# Patient Record
Sex: Female | Born: 1980 | Race: Black or African American | Hispanic: No | Marital: Single | Smoking: Never smoker
Health system: Southern US, Community
[De-identification: ages and names within clinical notes are randomized; demographics above are authoritative.]

## PROBLEM LIST (undated history)

## (undated) DIAGNOSIS — I10 Essential (primary) hypertension: Secondary | ICD-10-CM

## (undated) DIAGNOSIS — G932 Benign intracranial hypertension: Secondary | ICD-10-CM

## (undated) DIAGNOSIS — E282 Polycystic ovarian syndrome: Secondary | ICD-10-CM

## (undated) DIAGNOSIS — S060X9A Concussion with loss of consciousness of unspecified duration, initial encounter: Secondary | ICD-10-CM

## (undated) DIAGNOSIS — E038 Other specified hypothyroidism: Secondary | ICD-10-CM

## (undated) DIAGNOSIS — S060XAA Concussion with loss of consciousness status unknown, initial encounter: Secondary | ICD-10-CM

## (undated) HISTORY — DX: Polycystic ovarian syndrome: E28.2

## (undated) HISTORY — DX: Other specified hypothyroidism: E03.8

## (undated) HISTORY — PX: TUBAL LIGATION: SHX77

## (undated) HISTORY — DX: Benign intracranial hypertension: G93.2

## (undated) HISTORY — DX: Concussion with loss of consciousness of unspecified duration, initial encounter: S06.0X9A

## (undated) HISTORY — DX: Concussion with loss of consciousness status unknown, initial encounter: S06.0XAA

---
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
## 2000-01-16 ENCOUNTER — Encounter: Admission: RE | Admit: 2000-01-16 | Discharge: 2000-01-16 | Payer: Self-pay | Admitting: Family Medicine

## 2000-01-22 ENCOUNTER — Encounter: Admission: RE | Admit: 2000-01-22 | Discharge: 2000-01-22 | Payer: Self-pay | Admitting: Family Medicine

## 2000-02-17 ENCOUNTER — Encounter: Admission: RE | Admit: 2000-02-17 | Discharge: 2000-02-17 | Payer: Self-pay | Admitting: Family Medicine

## 2000-04-20 ENCOUNTER — Encounter: Admission: RE | Admit: 2000-04-20 | Discharge: 2000-04-20 | Payer: Self-pay | Admitting: Family Medicine

## 2000-07-08 ENCOUNTER — Encounter: Admission: RE | Admit: 2000-07-08 | Discharge: 2000-07-08 | Payer: Self-pay | Admitting: Family Medicine

## 2000-08-18 ENCOUNTER — Encounter: Admission: RE | Admit: 2000-08-18 | Discharge: 2000-08-18 | Payer: Self-pay | Admitting: Family Medicine

## 2000-09-28 ENCOUNTER — Encounter: Admission: RE | Admit: 2000-09-28 | Discharge: 2000-09-28 | Payer: Self-pay | Admitting: Sports Medicine

## 2000-10-05 ENCOUNTER — Encounter: Admission: RE | Admit: 2000-10-05 | Discharge: 2000-10-05 | Payer: Self-pay | Admitting: Family Medicine

## 2000-10-11 ENCOUNTER — Other Ambulatory Visit: Admission: RE | Admit: 2000-10-11 | Discharge: 2000-10-11 | Payer: Self-pay | Admitting: Family Medicine

## 2000-10-12 ENCOUNTER — Encounter: Admission: RE | Admit: 2000-10-12 | Discharge: 2000-10-12 | Payer: Self-pay | Admitting: Family Medicine

## 2000-10-25 ENCOUNTER — Encounter: Admission: RE | Admit: 2000-10-25 | Discharge: 2000-10-25 | Payer: Self-pay | Admitting: Family Medicine

## 2001-01-10 ENCOUNTER — Encounter: Admission: RE | Admit: 2001-01-10 | Discharge: 2001-01-10 | Payer: Self-pay | Admitting: Family Medicine

## 2001-01-13 ENCOUNTER — Inpatient Hospital Stay (HOSPITAL_COMMUNITY): Admission: AD | Admit: 2001-01-13 | Discharge: 2001-01-13 | Payer: Self-pay | Admitting: Obstetrics & Gynecology

## 2001-01-31 ENCOUNTER — Encounter: Admission: RE | Admit: 2001-01-31 | Discharge: 2001-01-31 | Payer: Self-pay | Admitting: Family Medicine

## 2001-04-20 ENCOUNTER — Encounter: Admission: RE | Admit: 2001-04-20 | Discharge: 2001-04-20 | Payer: Self-pay | Admitting: Family Medicine

## 2001-04-20 ENCOUNTER — Encounter (INDEPENDENT_AMBULATORY_CARE_PROVIDER_SITE_OTHER): Payer: Self-pay | Admitting: *Deleted

## 2001-08-01 ENCOUNTER — Encounter: Admission: RE | Admit: 2001-08-01 | Discharge: 2001-08-01 | Payer: Self-pay | Admitting: Family Medicine

## 2001-10-06 ENCOUNTER — Encounter: Admission: RE | Admit: 2001-10-06 | Discharge: 2001-10-06 | Payer: Self-pay | Admitting: Family Medicine

## 2002-08-17 ENCOUNTER — Inpatient Hospital Stay (HOSPITAL_COMMUNITY): Admission: AD | Admit: 2002-08-17 | Discharge: 2002-08-17 | Payer: Self-pay | Admitting: Obstetrics & Gynecology

## 2002-08-31 ENCOUNTER — Encounter: Payer: Self-pay | Admitting: Obstetrics and Gynecology

## 2002-08-31 ENCOUNTER — Other Ambulatory Visit: Admission: RE | Admit: 2002-08-31 | Discharge: 2002-08-31 | Payer: Self-pay | Admitting: Obstetrics and Gynecology

## 2002-08-31 ENCOUNTER — Ambulatory Visit (HOSPITAL_COMMUNITY): Admission: RE | Admit: 2002-08-31 | Discharge: 2002-08-31 | Payer: Self-pay | Admitting: Obstetrics and Gynecology

## 2002-10-18 ENCOUNTER — Inpatient Hospital Stay (HOSPITAL_COMMUNITY): Admission: AD | Admit: 2002-10-18 | Discharge: 2002-10-18 | Payer: Self-pay | Admitting: Obstetrics & Gynecology

## 2002-10-18 ENCOUNTER — Encounter: Payer: Self-pay | Admitting: Obstetrics & Gynecology

## 2003-01-18 ENCOUNTER — Inpatient Hospital Stay (HOSPITAL_COMMUNITY): Admission: AD | Admit: 2003-01-18 | Discharge: 2003-01-27 | Payer: Self-pay | Admitting: Obstetrics and Gynecology

## 2003-01-19 ENCOUNTER — Encounter: Payer: Self-pay | Admitting: Obstetrics and Gynecology

## 2003-01-24 ENCOUNTER — Encounter (INDEPENDENT_AMBULATORY_CARE_PROVIDER_SITE_OTHER): Payer: Self-pay | Admitting: *Deleted

## 2004-02-02 ENCOUNTER — Emergency Department (HOSPITAL_COMMUNITY): Admission: EM | Admit: 2004-02-02 | Discharge: 2004-02-02 | Payer: Self-pay | Admitting: Emergency Medicine

## 2005-09-01 ENCOUNTER — Ambulatory Visit: Payer: Self-pay | Admitting: Family Medicine

## 2005-09-03 ENCOUNTER — Encounter (INDEPENDENT_AMBULATORY_CARE_PROVIDER_SITE_OTHER): Payer: Self-pay | Admitting: *Deleted

## 2005-09-03 LAB — CONVERTED CEMR LAB

## 2005-09-30 ENCOUNTER — Emergency Department (HOSPITAL_COMMUNITY): Admission: EM | Admit: 2005-09-30 | Discharge: 2005-09-30 | Payer: Self-pay | Admitting: Emergency Medicine

## 2006-05-27 DIAGNOSIS — N912 Amenorrhea, unspecified: Secondary | ICD-10-CM | POA: Insufficient documentation

## 2006-05-27 DIAGNOSIS — E282 Polycystic ovarian syndrome: Secondary | ICD-10-CM | POA: Insufficient documentation

## 2006-05-27 DIAGNOSIS — N946 Dysmenorrhea, unspecified: Secondary | ICD-10-CM | POA: Insufficient documentation

## 2006-05-27 DIAGNOSIS — I10 Essential (primary) hypertension: Secondary | ICD-10-CM

## 2006-05-27 DIAGNOSIS — L680 Hirsutism: Secondary | ICD-10-CM

## 2006-05-28 ENCOUNTER — Encounter (INDEPENDENT_AMBULATORY_CARE_PROVIDER_SITE_OTHER): Payer: Self-pay | Admitting: *Deleted

## 2006-08-02 ENCOUNTER — Telehealth (INDEPENDENT_AMBULATORY_CARE_PROVIDER_SITE_OTHER): Payer: Self-pay | Admitting: *Deleted

## 2006-08-05 ENCOUNTER — Ambulatory Visit: Payer: Self-pay | Admitting: Family Medicine

## 2006-08-05 ENCOUNTER — Encounter (INDEPENDENT_AMBULATORY_CARE_PROVIDER_SITE_OTHER): Payer: Self-pay | Admitting: Family Medicine

## 2006-09-30 ENCOUNTER — Other Ambulatory Visit: Admission: RE | Admit: 2006-09-30 | Discharge: 2006-09-30 | Payer: Self-pay | Admitting: Gynecology

## 2007-08-05 ENCOUNTER — Emergency Department (HOSPITAL_COMMUNITY): Admission: EM | Admit: 2007-08-05 | Discharge: 2007-08-05 | Payer: Self-pay | Admitting: Family Medicine

## 2007-08-25 ENCOUNTER — Inpatient Hospital Stay (HOSPITAL_COMMUNITY): Admission: AD | Admit: 2007-08-25 | Discharge: 2007-08-25 | Payer: Self-pay | Admitting: Gynecology

## 2007-10-19 ENCOUNTER — Ambulatory Visit (HOSPITAL_COMMUNITY): Admission: RE | Admit: 2007-10-19 | Discharge: 2007-10-19 | Payer: Self-pay | Admitting: Family Medicine

## 2007-10-19 ENCOUNTER — Ambulatory Visit: Payer: Self-pay | Admitting: Family Medicine

## 2007-10-21 ENCOUNTER — Encounter: Payer: Self-pay | Admitting: Family Medicine

## 2007-10-21 ENCOUNTER — Ambulatory Visit: Payer: Self-pay | Admitting: Family Medicine

## 2007-10-21 LAB — CONVERTED CEMR LAB
BUN: 14 mg/dL (ref 6–23)
CO2: 20 meq/L (ref 19–32)
Chloride: 106 meq/L (ref 96–112)
Glucose, Bld: 86 mg/dL (ref 70–99)
HCT: 41.1 % (ref 36.0–46.0)
Hemoglobin: 12.9 g/dL (ref 12.0–15.0)
LDL Cholesterol: 160 mg/dL — ABNORMAL HIGH (ref 0–99)
Potassium: 4 meq/L (ref 3.5–5.3)
Protein, U semiquant: 30
RBC: 4.45 M/uL (ref 3.87–5.11)
TSH: 2.978 microintl units/mL (ref 0.350–4.50)
VLDL: 27 mg/dL (ref 0–40)

## 2007-10-25 ENCOUNTER — Encounter: Payer: Self-pay | Admitting: *Deleted

## 2007-10-25 ENCOUNTER — Encounter: Payer: Self-pay | Admitting: Family Medicine

## 2007-11-18 ENCOUNTER — Encounter: Payer: Self-pay | Admitting: *Deleted

## 2008-04-08 ENCOUNTER — Inpatient Hospital Stay (HOSPITAL_COMMUNITY): Admission: AD | Admit: 2008-04-08 | Discharge: 2008-04-08 | Payer: Self-pay | Admitting: Obstetrics and Gynecology

## 2008-07-11 ENCOUNTER — Ambulatory Visit: Payer: Self-pay | Admitting: Critical Care Medicine

## 2008-07-11 ENCOUNTER — Inpatient Hospital Stay (HOSPITAL_COMMUNITY): Admission: AD | Admit: 2008-07-11 | Discharge: 2008-07-15 | Payer: Self-pay | Admitting: Obstetrics and Gynecology

## 2008-07-11 ENCOUNTER — Encounter (INDEPENDENT_AMBULATORY_CARE_PROVIDER_SITE_OTHER): Payer: Self-pay | Admitting: Obstetrics and Gynecology

## 2009-12-29 ENCOUNTER — Inpatient Hospital Stay (HOSPITAL_COMMUNITY): Admission: AD | Admit: 2009-12-29 | Discharge: 2009-12-29 | Payer: Self-pay | Admitting: Obstetrics and Gynecology

## 2010-03-13 ENCOUNTER — Ambulatory Visit (HOSPITAL_COMMUNITY)
Admission: RE | Admit: 2010-03-13 | Discharge: 2010-03-13 | Payer: Self-pay | Source: Home / Self Care | Attending: Obstetrics and Gynecology | Admitting: Obstetrics and Gynecology

## 2010-03-20 ENCOUNTER — Inpatient Hospital Stay (HOSPITAL_COMMUNITY)
Admission: AD | Admit: 2010-03-20 | Discharge: 2010-03-21 | Payer: Self-pay | Source: Home / Self Care | Attending: Obstetrics and Gynecology | Admitting: Obstetrics and Gynecology

## 2010-03-26 ENCOUNTER — Inpatient Hospital Stay (HOSPITAL_COMMUNITY)
Admission: AD | Admit: 2010-03-26 | Discharge: 2010-03-28 | Payer: Self-pay | Source: Home / Self Care | Attending: Obstetrics and Gynecology | Admitting: Obstetrics and Gynecology

## 2010-03-26 ENCOUNTER — Ambulatory Visit (HOSPITAL_COMMUNITY)
Admission: RE | Admit: 2010-03-26 | Discharge: 2010-03-26 | Payer: Self-pay | Source: Home / Self Care | Attending: Obstetrics and Gynecology | Admitting: Obstetrics and Gynecology

## 2010-03-26 ENCOUNTER — Ambulatory Visit (HOSPITAL_COMMUNITY): Admission: RE | Admit: 2010-03-26 | Payer: Self-pay | Source: Home / Self Care | Admitting: Obstetrics and Gynecology

## 2010-03-28 ENCOUNTER — Ambulatory Visit (HOSPITAL_COMMUNITY): Admission: RE | Admit: 2010-03-28 | Payer: Self-pay | Source: Home / Self Care | Admitting: Obstetrics and Gynecology

## 2010-04-01 ENCOUNTER — Ambulatory Visit (HOSPITAL_COMMUNITY): Admission: RE | Admit: 2010-04-01 | Payer: Self-pay | Source: Home / Self Care | Admitting: Obstetrics and Gynecology

## 2010-04-03 ENCOUNTER — Encounter (INDEPENDENT_AMBULATORY_CARE_PROVIDER_SITE_OTHER): Payer: Self-pay | Admitting: Obstetrics and Gynecology

## 2010-04-03 ENCOUNTER — Inpatient Hospital Stay (HOSPITAL_COMMUNITY)
Admission: RE | Admit: 2010-04-03 | Discharge: 2010-04-06 | Payer: Self-pay | Source: Home / Self Care | Attending: Obstetrics and Gynecology | Admitting: Obstetrics and Gynecology

## 2010-04-03 LAB — URINALYSIS, ROUTINE W REFLEX MICROSCOPIC
Bilirubin Urine: NEGATIVE
Hemoglobin, Urine: NEGATIVE
Ketones, ur: 15 mg/dL — AB
Nitrite: NEGATIVE
Protein, ur: NEGATIVE mg/dL
Specific Gravity, Urine: 1.015 (ref 1.005–1.030)
Urine Glucose, Fasting: NEGATIVE mg/dL
Urobilinogen, UA: 0.2 mg/dL (ref 0.0–1.0)
pH: 6.5 (ref 5.0–8.0)

## 2010-04-03 LAB — COMPREHENSIVE METABOLIC PANEL
ALT: 11 U/L (ref 0–35)
ALT: 9 U/L (ref 0–35)
AST: 14 U/L (ref 0–37)
AST: 17 U/L (ref 0–37)
Albumin: 2.4 g/dL — ABNORMAL LOW (ref 3.5–5.2)
Albumin: 2.3 g/dL — ABNORMAL LOW (ref 3.5–5.2)
Alkaline Phosphatase: 150 U/L — ABNORMAL HIGH (ref 39–117)
Alkaline Phosphatase: 138 U/L — ABNORMAL HIGH (ref 39–117)
BUN: 6 mg/dL (ref 6–23)
BUN: 5 mg/dL — ABNORMAL LOW (ref 6–23)
CO2: 22 mEq/L (ref 19–32)
CO2: 19 mEq/L (ref 19–32)
Calcium: 8.8 mg/dL (ref 8.4–10.5)
Calcium: 8.6 mg/dL (ref 8.4–10.5)
Chloride: 108 mEq/L (ref 96–112)
Chloride: 107 mEq/L (ref 96–112)
Creatinine, Ser: 0.71 mg/dL (ref 0.4–1.2)
Creatinine, Ser: 0.51 mg/dL (ref 0.4–1.2)
GFR calc Af Amer: >60 mL/min (ref >60)
GFR calc Af Amer: >60 mL/min (ref >60)
GFR calc non Af Amer: >60 mL/min (ref >60)
GFR calc non Af Amer: >60 mL/min (ref >60)
Glucose, Bld: 78 mg/dL (ref 70–99)
Glucose, Bld: 86 mg/dL (ref 70–99)
Potassium: 3.9 mEq/L (ref 3.5–5.1)
Potassium: 3.4 mEq/L — ABNORMAL LOW (ref 3.5–5.1)
Sodium: 137 mEq/L (ref 135–145)
Sodium: 132 mEq/L — ABNORMAL LOW (ref 135–145)
Total Bilirubin: 0.5 mg/dL (ref 0.3–1.2)
Total Bilirubin: 0.3 mg/dL (ref 0.3–1.2)
Total Protein: 6.6 g/dL (ref 6.0–8.3)
Total Protein: 5.8 g/dL — ABNORMAL LOW (ref 6.0–8.3)

## 2010-04-03 LAB — URIC ACID: Uric Acid, Serum: 5.2 mg/dL (ref 2.4–7.0)

## 2010-04-03 LAB — CBC
HCT: 30.7 % — ABNORMAL LOW (ref 36.0–46.0)
HCT: 30 % — ABNORMAL LOW (ref 36.0–46.0)
Hemoglobin: 9.9 g/dL — ABNORMAL LOW (ref 12.0–15.0)
Hemoglobin: 10 g/dL — ABNORMAL LOW (ref 12.0–15.0)
MCH: 27.6 pg (ref 26.0–34.0)
MCH: 28.1 pg (ref 26.0–34.0)
MCHC: 32.2 g/dL (ref 30.0–36.0)
MCHC: 33.3 g/dL (ref 30.0–36.0)
MCV: 85.5 fL (ref 78.0–100.0)
MCV: 84.3 fL (ref 78.0–100.0)
Platelets: 156 10*3/uL (ref 150–400)
Platelets: 156 10*3/uL (ref 150–400)
RBC: 3.59 MIL/uL — ABNORMAL LOW (ref 3.87–5.11)
RBC: 3.56 MIL/uL — ABNORMAL LOW (ref 3.87–5.11)
RDW: 14.8 % (ref 11.5–15.5)
RDW: 14.4 % (ref 11.5–15.5)
WBC: 9 10*3/uL (ref 4.0–10.5)
WBC: 10.1 10*3/uL (ref 4.0–10.5)

## 2010-04-03 LAB — RPR: RPR Ser Ql: NONREACTIVE

## 2010-04-03 LAB — ABO/RH: ABO/RH(D): O POS

## 2010-04-03 LAB — TYPE AND SCREEN
ABO/RH(D): O POS
Antibody Screen: NEGATIVE

## 2010-04-03 LAB — LACTATE DEHYDROGENASE: LDH: 221 U/L (ref 94–250)

## 2010-04-04 LAB — CBC
HCT: 28.3 % — ABNORMAL LOW (ref 36.0–46.0)
Hemoglobin: 9.2 g/dL — ABNORMAL LOW (ref 12.0–15.0)
MCH: 27.7 pg (ref 26.0–34.0)
MCHC: 32.5 g/dL (ref 30.0–36.0)
MCV: 85.2 fL (ref 78.0–100.0)
Platelets: 171 10*3/uL (ref 150–400)
RBC: 3.32 MIL/uL — ABNORMAL LOW (ref 3.87–5.11)
RDW: 14.8 % (ref 11.5–15.5)
WBC: 8.6 10*3/uL (ref 4.0–10.5)

## 2010-04-04 LAB — BASIC METABOLIC PANEL
BUN: 5 mg/dL — ABNORMAL LOW (ref 6–23)
CO2: 21 mEq/L (ref 19–32)
Calcium: 8.5 mg/dL (ref 8.4–10.5)
Chloride: 107 mEq/L (ref 96–112)
Creatinine, Ser: 0.63 mg/dL (ref 0.4–1.2)
GFR calc Af Amer: >60 mL/min (ref >60)
GFR calc non Af Amer: >60 mL/min (ref >60)
Glucose, Bld: 86 mg/dL (ref 70–99)
Potassium: 3.9 mEq/L (ref 3.5–5.1)
Sodium: 134 mEq/L — ABNORMAL LOW (ref 135–145)

## 2010-04-20 ENCOUNTER — Encounter: Payer: Self-pay | Admitting: Obstetrics and Gynecology

## 2010-04-30 ENCOUNTER — Encounter: Payer: Self-pay | Admitting: Obstetrics and Gynecology

## 2010-05-19 ENCOUNTER — Encounter: Payer: Self-pay | Admitting: *Deleted

## 2010-06-09 LAB — COMPREHENSIVE METABOLIC PANEL
ALT: 11 U/L (ref 0–35)
AST: 13 U/L (ref 0–37)
AST: 14 U/L (ref 0–37)
AST: 16 U/L (ref 0–37)
Albumin: 2.5 g/dL — ABNORMAL LOW (ref 3.5–5.2)
Alkaline Phosphatase: 131 U/L — ABNORMAL HIGH (ref 39–117)
BUN: 5 mg/dL — ABNORMAL LOW (ref 6–23)
BUN: 6 mg/dL (ref 6–23)
CO2: 21 mEq/L (ref 19–32)
CO2: 22 mEq/L (ref 19–32)
Calcium: 8.7 mg/dL (ref 8.4–10.5)
Calcium: 9.1 mg/dL (ref 8.4–10.5)
Chloride: 106 mEq/L (ref 96–112)
Creatinine, Ser: 0.57 mg/dL (ref 0.4–1.2)
Creatinine, Ser: 0.64 mg/dL (ref 0.4–1.2)
GFR calc Af Amer: >60 mL/min (ref >60)
GFR calc Af Amer: >60 mL/min (ref >60)
GFR calc Af Amer: >60 mL/min (ref >60)
GFR calc non Af Amer: >60 mL/min (ref >60)
GFR calc non Af Amer: >60 mL/min (ref >60)
Glucose, Bld: 82 mg/dL (ref 70–99)
Potassium: 3.6 mEq/L (ref 3.5–5.1)
Sodium: 133 mEq/L — ABNORMAL LOW (ref 135–145)
Sodium: 135 mEq/L (ref 135–145)
Total Bilirubin: 0.6 mg/dL (ref 0.3–1.2)
Total Bilirubin: 0.6 mg/dL (ref 0.3–1.2)
Total Protein: 5.9 g/dL — ABNORMAL LOW (ref 6.0–8.3)
Total Protein: 6.1 g/dL (ref 6.0–8.3)

## 2010-06-09 LAB — PROTEIN, URINE, 24 HOUR
Protein, 24H Urine: 330 mg/d — ABNORMAL HIGH (ref 50–100)
Protein, Urine: 33 mg/dL
Urine Total Volume-UPROT: 1000 mL

## 2010-06-09 LAB — CBC
Hemoglobin: 9.7 g/dL — ABNORMAL LOW (ref 12.0–15.0)
MCH: 27.1 pg (ref 26.0–34.0)
MCH: 27.6 pg (ref 26.0–34.0)
MCHC: 32.4 g/dL (ref 30.0–36.0)
MCV: 85.3 fL (ref 78.0–100.0)
Platelets: 159 10*3/uL (ref 150–400)
Platelets: 164 10*3/uL (ref 150–400)
RBC: 3.47 MIL/uL — ABNORMAL LOW (ref 3.87–5.11)
RBC: 3.65 MIL/uL — ABNORMAL LOW (ref 3.87–5.11)
RDW: 14.6 % (ref 11.5–15.5)
RDW: 14.6 % (ref 11.5–15.5)
WBC: 8.8 10*3/uL (ref 4.0–10.5)
WBC: 9.2 10*3/uL (ref 4.0–10.5)

## 2010-06-09 LAB — CREATININE CLEARANCE, URINE, 24 HOUR
Collection Interval-CRCL: 24 hours
Creatinine Clearance: 225 mL/min — ABNORMAL HIGH (ref 75–115)
Creatinine, 24H Ur: 1908 mg/d — ABNORMAL HIGH (ref 700–1800)

## 2010-06-09 LAB — SURGICAL PCR SCREEN: Staphylococcus aureus: POSITIVE — AB

## 2010-06-09 LAB — URIC ACID: Uric Acid, Serum: 4.8 mg/dL (ref 2.4–7.0)

## 2010-06-11 LAB — WET PREP, GENITAL: Trich, Wet Prep: NONE SEEN

## 2010-06-11 LAB — URINALYSIS, ROUTINE W REFLEX MICROSCOPIC
Protein, ur: NEGATIVE mg/dL
Specific Gravity, Urine: 1.02 (ref 1.005–1.030)
Urobilinogen, UA: 0.2 mg/dL (ref 0.0–1.0)

## 2010-06-11 LAB — URINE MICROSCOPIC-ADD ON

## 2010-07-09 LAB — COMPREHENSIVE METABOLIC PANEL
ALT: 103 U/L — ABNORMAL HIGH (ref 0–35)
ALT: 173 U/L — ABNORMAL HIGH (ref 0–35)
AST: 45 U/L — ABNORMAL HIGH (ref 0–37)
AST: 142 U/L — ABNORMAL HIGH (ref 0–37)
Albumin: 2.3 g/dL — ABNORMAL LOW (ref 3.5–5.2)
Albumin: 2.4 g/dL — ABNORMAL LOW (ref 3.5–5.2)
Albumin: 3 g/dL — ABNORMAL LOW (ref 3.5–5.2)
Alkaline Phosphatase: 67 U/L (ref 39–117)
Alkaline Phosphatase: 65 U/L (ref 39–117)
BUN: 5 mg/dL — ABNORMAL LOW (ref 6–23)
BUN: 5 mg/dL — ABNORMAL LOW (ref 6–23)
BUN: 7 mg/dL (ref 6–23)
CO2: 22 mEq/L (ref 19–32)
Chloride: 106 mEq/L (ref 96–112)
Chloride: 108 mEq/L (ref 96–112)
Chloride: 103 mEq/L (ref 96–112)
Creatinine, Ser: 0.78 mg/dL (ref 0.4–1.2)
Creatinine, Ser: 0.78 mg/dL (ref 0.4–1.2)
Creatinine, Ser: 0.78 mg/dL (ref 0.4–1.2)
GFR calc Af Amer: >60 mL/min (ref >60)
GFR calc Af Amer: >60 mL/min (ref >60)
Glucose, Bld: 88 mg/dL (ref 70–99)
Glucose, Bld: 99 mg/dL (ref 70–99)
Potassium: 3.5 mEq/L (ref 3.5–5.1)
Potassium: 3.7 mEq/L (ref 3.5–5.1)
Sodium: 136 mEq/L (ref 135–145)
Total Bilirubin: 0.2 mg/dL — ABNORMAL LOW (ref 0.3–1.2)
Total Bilirubin: 0.3 mg/dL (ref 0.3–1.2)
Total Bilirubin: 0.5 mg/dL (ref 0.3–1.2)
Total Bilirubin: 0.6 mg/dL (ref 0.3–1.2)
Total Protein: 5.9 g/dL — ABNORMAL LOW (ref 6.0–8.3)
Total Protein: 5.7 g/dL — ABNORMAL LOW (ref 6.0–8.3)
Total Protein: 7.1 g/dL (ref 6.0–8.3)

## 2010-07-09 LAB — CBC
HCT: 26.8 % — ABNORMAL LOW (ref 36.0–46.0)
HCT: 30.1 % — ABNORMAL LOW (ref 36.0–46.0)
HCT: 35.5 % — ABNORMAL LOW (ref 36.0–46.0)
Hemoglobin: 9.1 g/dL — ABNORMAL LOW (ref 12.0–15.0)
Hemoglobin: 10.4 g/dL — ABNORMAL LOW (ref 12.0–15.0)
MCHC: 34.6 g/dL (ref 30.0–36.0)
MCV: 88 fL (ref 78.0–100.0)
MCV: 86.9 fL (ref 78.0–100.0)
MCV: 87.6 fL (ref 78.0–100.0)
Platelets: 77 10*3/uL — ABNORMAL LOW (ref 150–400)
Platelets: 86 10*3/uL — ABNORMAL LOW (ref 150–400)
RBC: 3.47 MIL/uL — ABNORMAL LOW (ref 3.87–5.11)
RDW: 16.1 % — ABNORMAL HIGH (ref 11.5–15.5)
RDW: 15.1 % (ref 11.5–15.5)
WBC: 13 10*3/uL — ABNORMAL HIGH (ref 4.0–10.5)

## 2010-07-09 LAB — BASIC METABOLIC PANEL
CO2: 22 mEq/L (ref 19–32)
Calcium: 8.8 mg/dL (ref 8.4–10.5)
Chloride: 105 mEq/L (ref 96–112)
Glucose, Bld: 78 mg/dL (ref 70–99)
Potassium: 3.9 mEq/L (ref 3.5–5.1)
Sodium: 135 mEq/L (ref 135–145)

## 2010-07-09 LAB — LACTATE DEHYDROGENASE: LDH: 301 U/L — ABNORMAL HIGH (ref 94–250)

## 2010-07-09 LAB — URIC ACID
Uric Acid, Serum: 6.5 mg/dL (ref 2.4–7.0)
Uric Acid, Serum: 6.5 mg/dL (ref 2.4–7.0)
Uric Acid, Serum: 5.8 mg/dL (ref 2.4–7.0)

## 2010-07-09 LAB — URINALYSIS, ROUTINE W REFLEX MICROSCOPIC
Ketones, ur: NEGATIVE mg/dL
Leukocytes, UA: NEGATIVE
Nitrite: NEGATIVE
Urobilinogen, UA: 0.2 mg/dL (ref 0.0–1.0)
pH: 7 (ref 5.0–8.0)

## 2010-07-09 LAB — URINE MICROSCOPIC-ADD ON

## 2010-07-14 LAB — URINALYSIS, ROUTINE W REFLEX MICROSCOPIC
Bilirubin Urine: NEGATIVE
Glucose, UA: NEGATIVE mg/dL
Hgb urine dipstick: NEGATIVE
Nitrite: NEGATIVE
Specific Gravity, Urine: 1.015 (ref 1.005–1.030)
pH: 7 (ref 5.0–8.0)

## 2010-07-14 LAB — DIFFERENTIAL
Basophils Relative: 0 % (ref 0–1)
Eosinophils Relative: 0 % (ref 0–5)
Monocytes Absolute: 0.7 10*3/uL (ref 0.1–1.0)
Monocytes Relative: 5 % (ref 3–12)
Neutro Abs: 13 10*3/uL — ABNORMAL HIGH (ref 1.7–7.7)

## 2010-07-14 LAB — CBC
HCT: 34.1 % — ABNORMAL LOW (ref 36.0–46.0)
Hemoglobin: 11.7 g/dL — ABNORMAL LOW (ref 12.0–15.0)
MCHC: 34.4 g/dL (ref 30.0–36.0)
MCV: 87.8 fL (ref 78.0–100.0)
RBC: 3.89 MIL/uL (ref 3.87–5.11)

## 2010-07-14 LAB — WET PREP, GENITAL
Trich, Wet Prep: NONE SEEN
Yeast Wet Prep HPF POC: NONE SEEN

## 2010-08-12 NOTE — Discharge Summary (Signed)
Anna Anderson, WORMLEY NO.:  [id number]   MEDICAL RECORD NO.:  [id number]          PATIENT TYPE:  INP   LOCATION:  NA                            [facility]:  [facility]   PHYSICIAN:  Malachi Pro. Ambrose Mantle, M.D. DATE OF BIRTH:  1980/09/28   DATE OF ADMISSION:  07/11/2008  DATE OF DISCHARGE:  07/15/2008                               DISCHARGE SUMMARY   ADDENDUM   This patient was admitted on the 14th and discharged on the 18th.  Her  initial part of the discharge summary has been dictated twice.  On both  occasions I got called for an emergency, I actually dictated all, but  the laboratory data.   The patient's laboratory data showed admission hemoglobin of 12,  hematocrit 35.5, platelet count 86,000, and white count 13,000.  PIH  panel showed an SGOT of 167, SGPT of 183, LDH of 507, and uric acid 5.8.  The urinalysis showed greater than 300 mg% protein.   Followup labs on the 15th, 16th, and 17th showed platelet count of  80,000, SGOT of 142, SGPT 173, and LDH 434.  On the 16th, hemoglobin  9.1, hematocrit 26.5, platelet count 77,000, SGOT 45, SGPT 103, LDH 371,  and mag level was 5.2.  On the 17th, hemoglobin 9.1, hematocrit 26.8,  platelet count 90,000, SGOT 29, SGPT 69, and LDH 301.   FINAL DIAGNOSES:  Intrauterine pregnancy at 32 weeks and 5 days,  delivered breech by cesarean section, low transverse cervical cesarean  section, prior classical cesarean section.   OPERATION:  Low transverse cervical cesarean section.   ADDITIONAL DIAGNOSES:  Hemolysis, elevated liver enzymes, and low  platelet syndrome and history of hypertension.   MEDICATIONS AT DISCHARGE:  1. Darvocet-N 100, 30 tablets 1 every 4-6 hours as needed for pain.  2. Labetalol 400 mg t.i.d., to be taken as 200 mg tablets 2 tablets      t.i.d.  3. Cozaar 100 mg 1 by mouth every day.  4. Norvasc 10 mg 1 by mouth every day.  5. Hydrochlorothiazide 25 mg 1 by mouth every day.   The patient was instructed  to call our office on July 16, 2008, and ask  for an appointment to see Dr. Ellyn Hack to take her staples out.  She was  given our regular discharge instruction booklet and asked to call with  any problems.      Malachi Pro. Ambrose Mantle, M.D.  Electronically Signed     TFH/MEDQ  D:  07/15/2008  T:  07/15/2008  Job:  [id number]   cc:   Charlcie Cradle. Delford Field, MD, FCCP  [address]  [place]  [place] [address]

## 2010-08-12 NOTE — Discharge Summary (Signed)
NAMEGLORIS, SHIROMA NO.:  [id number]   MEDICAL RECORD NO.:  [id number]          PATIENT TYPE:  INP   LOCATION:  9372                          [facility]ILITY[facility]   PHYSICIAN:  Malachi Pro. Ambrose Mantle, M.D. DATE OF BIRTH:  [birth year]   DATE OF ADMISSION:  07/11/2008  DATE OF DISCHARGE:  07/15/2008                               DISCHARGE SUMMARY   A 30 year old African American female para 0-1-0-1, gravida 2 at 32  weeks and 5 days came in with HELLP syndrome and Dr. Ellyn Hack delivered  the breech presenting infant who weighed 1578 g by cesarean section.  The patient has had a previous classical cesarean section.  Her blood  pressure was extremely high necessitating delivery 227/121.  Dr. Ellyn Hack  consulted with the [facility] Maternal [facility]etal [facility] staff, they felt  that she should be delivered promptly.  The patient had greater than 300  mg% protein.  She had SGOT of 167, SGPT of 183, LDH of 507, uric acid  5.8, platelet count was 86,000.  After delivery, the patient continued  to have high blood pressure.  She was treated with magnesium sulfate for  seizure prophylaxis.  She was begun on labetalol 200 mg twice a day and  subsequently the labetalol was increased to 300 twice a day and  ultimately to 400 three times a day and hydrochlorothiazide 25 mg a day  was added.  In spite of that, the blood pressure remained intermittently  elevated to 191/120.  She was given hydralazine 5 mg IV with good  control of the blood pressure, but then it would come back up.  She was  seen by Critical Care Medicine, Dr. Delford Field and he added Cozaar and  Norvasc.  On the 4th postop day, the patient was ambulating well without  difficulty, tolerating a regular diet, voiding well, and was ready for  discharge.  Her incision was healing well.   FINAL DIAGNOSIS:  Intrauterine pregnancy at 32 weeks and 5 days with  hemolysis, elevated liver enzymes, low platelet count syndrome.   Operation:   Low-transverse cervical C-section.   MEDICATIONS:  1. Labetalol 400 mg t.i.d.  2. Hydrochlorothiazide 25 mg once a day.  3. Darvocet-N 100 thirty tablets one every 4-6 hours as needed for      pain.  4. Norvasc 10 mg by mouth once a day.  5. Cozaar 100 mg once a day.      Malachi Pro. Ambrose Mantle, M.D.  Electronically Signed     TFH/MEDQ  D:  07/15/2008  T:  07/15/2008  Job:  [id number]

## 2010-08-12 NOTE — Op Note (Signed)
Anna Anderson, Anna Anderson              ACCOUNT NO.:  [id number]   MEDICAL RECORD NO.:  [id number]          PATIENT TYPE:  INP   LOCATION:  9372                          [facility]ILITY:  [facility]   PHYSICIAN:  Sherron Monday, MD        DATE OF BIRTH:  Sep 03, 1980   DATE OF PROCEDURE:  07/11/2008  DATE OF DISCHARGE:                               OPERATIVE REPORT   PREOPERATIVE DIAGNOSES:  Intrauterine pregnancy at 32+ weeks; hemolysis,  elevated liver enzymes, low platelets syndrome.   POSTOPERATIVE DIAGNOSES:  Intrauterine pregnancy at 32+ weeks;  hemolysis, elevated liver enzymes, low platelets syndrome.   PROCEDURE:  Repeat low transverse cesarean section.   ANESTHESIA:  Spinal.   SURGEON:  Sherron Monday, MD   FINDINGS:  Viable female infant at 2238 with Apgars of 7 at 1 minute and 7  at 5 minutes and a weight of 1598 g.  The placenta was expressed intact.  Normal uterus, tubes, and ovaries were noted.   PATHOLOGY:  Placenta.   COMPLICATIONS:  None.   ESTIMATED BLOOD LOSS:  500 mL.   IV FLUIDS:  2300 mL.   URINE OUTPUT:  Less than 100 mL clear but concentrated urine at the end  of the procedure.   DISPOSITION:  Stable to PACU.   PROCEDURE IN DETAIL:  After informed consent was discussed with the  patient including risks, benefits, and alternatives of a cesarean  section, patient also prior to having the cesarean section had a  Neonatology consult.  The patient was discussed at length with Maternal  Fetal Medicine and the decision was made to proceed with a cesarean  section without the benefit of betamethasone secondary to HELLP syndrome  and being passed 32 weeks.  Prior to the cesarean section, there was  trouble establishing IV access and blood pressures were 220/120 on  arrival.  Prior to the section, 200/115.  They were serially elevated  throughout the section and closely monitored.   The patient was transported to the operating room where spinal  anesthesia was then placed and  found to be adequate, returned to supine  position with a leftward tilt.  She was prepped and draped in a normal  sterile fashion.  Pfannenstiel incision was made at the level of the  previous incisions, carried through the underlying layer of fascia  sharply.  The fascia was incised in the midline.  The midline incision  was extended laterally with Mayo scissors.  The inferior aspect of the  fascial incision was grasped with Kocher clamps, elevated and the rectus  muscles were dissected off both bluntly and sharply.  Attention was then  turned to the superior portion, which in a similar fashion, the fascia  was tented up and the rectus muscles were dissected off bluntly and  sharply.  The midline was easily identified and the peritoneum was  entered bluntly.  This was extended superiorly and inferiorly with good  visualization of the bladder.  The Alexis skin retractor was placed  carefully, checked to make sure no bowel was entrapped.  The  vesicouterine peritoneum was identified, tented up  with smooth pickups  and the bladder flap was created digitally and sharply.  The uterus was  incised in a transverse fashion and the infant was delivered from a  vertex presentation.  Nose and mouth were suctioned on the field.  Cord  was clamped and cut.  Infant was handed off to the awaiting NICU staff.  The placenta was then expressed from the uterus.  The uterus was cleared  of all clots and debris.  Uterine incision was closed with 2 layers of 0  Monocryl, the first of which is a running locked and the second as an  imbricating layer.  Hemostasis was assured with several sutures of 0  Vicryl.  The pelvis was copiously irrigated.  Peritoneum was  reapproximated using 2-0 Vicryl.  The rectus muscles were made  hemostatic with Bovie cautery.  The fascia was reapproximated using 0  Vicryl.  The subcuticular adipose layer was irrigated and made  hemostatic with Bovie cautery.  A suture was placed to  reapproximate  this subcuticular adipose layer with 3-0 plain gut.  Skin was closed  with staples.  The patient tolerated the procedure well.  A pressure  dressing was placed secondary to diffuse oozing.  The patient was  transferred to PACU in stable condition.  Her blood pressure continued  to be carefully watched, and she was also started on magnesium for  seizure prophylaxis.  All this was discussed at length with the patient.  The patient tolerated the procedure well.  Sponge, lap, and needle  counts were correct x2 at the end of procedure.       Sherron Monday, MD  Electronically Signed     JB/MEDQ  D:  07/11/2008  T:  07/12/2008  Job:  [id number]

## 2010-08-12 NOTE — Discharge Summary (Signed)
Anna Anderson, Anna Anderson NO.:  [id number]   MEDICAL RECORD NO.:  [id number]          PATIENT TYPE:  INP   LOCATION:  9372                          [facility]   PHYSICIAN:  Malachi Pro. Ambrose Mantle, M.D. DATE OF BIRTH:  Dec 06, 1980   DATE OF ADMISSION:  07/11/2008  DATE OF DISCHARGE:  07/15/2008                               DISCHARGE SUMMARY   This is a 29 year old African American female, para 0-1-0-1 at 32 weeks  and 5 days admitted with back pain, abdominal pain, no leakage of fluid,  no vaginal bleeding, good fetal movement.  The pain was described as  constant and coming and going.  No headache, some blurry vision.  After  taking Flexeril, some right upper quadrant pain.   PAST MEDICAL HISTORY:  Chronic hypertension.   CERVICAL HISTORY:  Low transverse cervical C-section.   ALLERGIES:  AMOXICILLIN cause increased respirations.   PAST SURGICAL HISTORY:  She has had low transverse cervical C-section  and she delivered at 26 weeks by classical C-section a baby with  placenta previa and footling breech that was conceived with Clomid.  The  patient was treated with Delalutin during the pregnancy.  She had no  history of abnormal paps, no STDs.   MEDICATIONS:  Labetalol and Flexeril.   FAMILY HISTORY:  Maternal grandmother with an MI.  Uncles with high  blood pressure.  Cousin with lupus and diabetes in an aunt and a cousin.   PHYSICAL EXAMINATION:  VITAL SIGNS:  Her blood pressure was 227/121.  HEART/LUNGS:  Normal.  ABDOMEN:  Soft.  PELVIC:  Cervix was not examined.   LABORATORY FINDINGS:  A urine protein of greater than 300 mg percent,  hemoglobin 13, hematocrit 42, platelet count was 86,000.  SGOT was 167,  SGPT 183, LDH was 507, uric acid 5.8.   IMPRESSIONS ON ADMISSION:  Intrauterine pregnancy at 32+ weeks with  hemolysis, elevated liver enzymes, low platelets syndrome.  Dr. Ellyn Hack  discussed with Maternal Fetal Medicine.  They felt there was no need  for  expectant management.  The patient underwent a cesarean section by Dr.  Ellyn Hack, low transverse cervical cesarean section with delivery of a 1578  g female infant with Apgars of 7 at 1 and 7 at 5 minutes.  Placenta was  expressed intact.  Uterus, tubes, and ovaries appeared normal.  Placenta  was sent to Pathology.  Blood loss was estimated at 500 mL.  Postpartum,  the patient did reasonably well.  She did have continued issues with  blood pressure elevation.  She was treated with magnesium sulfate and placed on labetalol,  subsequently on hydrochlorothiazide.  She was seen in consultation by  Dr. Delford Field from Critical Care Medicine, who added Cozaar and Norvasc to  her regimen and on the 4th postop day, she was afebrile.  Blood pressure  was 159/96 and she was ready for discharge.      Malachi Pro. Ambrose Mantle, M.D.  Electronically Signed     TFH/MEDQ  D:  07/15/2008  T:  07/15/2008  Job:  [id number]

## 2010-08-15 NOTE — Discharge Summary (Signed)
NAME:  Anna Anderson, Anna Anderson NO.:  [id number]   MEDICAL RECORD NO.:  [id number]                   PATIENT TYPE:  INP   LOCATION:  9322                                 [facility]   PHYSICIAN:  Carrington Clamp, M.D.              DATE OF BIRTH:  Jan 22, 1981   DATE OF ADMISSION:  01/18/2003  DATE OF DISCHARGE:  01/27/2003                                 DISCHARGE SUMMARY   FINAL DIAGNOSES:  1. Intrauterine pregnancy at 44 and four-sevenths weeks gestation.  2. Marginal placenta previa.  3. Bleeding.  4. Breech presentation.  5. Preterm labor.  6. Placental abruption.   PROCEDURE:  Primary classical cesarean section.   SURGEON:  Randye Lobo, M.D.   ASSISTANT:  Ilda Mori, M.D.   COMPLICATIONS:  None.   This 30 year old G1 P0 presented at 30 and four-sevenths weeks gestation  with painless vaginal bleeding while at rest.  The patient has been feeling  some pressure but no actual contractions.  The patient denies any trauma,  substance abuse, or any other event she can remember.  The patient's  antepartum course up to this point had been uncomplicated.  She is admitted  at this time.  Upon examination a large blood clot was noted in the vagina.  The patient's cervix was about a fingertip dilated but the presenting part  was high.  Bedside ultrasound was performed which showed a marginal previa  and a double footling breech presentation but no retroplacental clot.  The  patient is admitted at this time for close observation, bedrest, continual  blood counts, betamethasone by protocol, and discussed risks of preterm  delivery with the patient.  The patient was started on magnesium sulfate for  some preterm uterine activity.  She was also started on Unasyn 3 grams IV  q.6h.  The patient's bleeding during this time decreased slightly.  By  hospital day #4 the patient was feeling fine, not having any bleeding or  contractions.  The ultrasound did  show no previa, that it was actually a  clot over the cervix at that time.  At this point magnesium sulfate was  stopped and Procardia was added.  Hospital day #5 the patient was having  occasional contractions but was just on Procardia at this point.  Nonstress  tests continued to be reactive.  Later that afternoon the patient did report  some left lower quadrant pain.  At this point the patient was contracting  every six to seven minutes.  Her cervix was 2 cm dilated, 90% effaced, a  breech presentation with a bulging bag.  Magnesium sulfate was restarted at  this point and was stable on bedrest.  October 27 Dr. Edward Jolly was called to  see the patient.  She was having heavy vaginal bleeding.  Cervix was about 3  cm dilated.  At this point a discussion was made with the patient to proceed  with  a cesarean section secondary to probable partial placental abruption  and preterm labor.  Of note, the patient's last ultrasound showed no  placental previa but did continue to show the breech presentation.  At this  point magnesium sulfate was stopped and the patient was prepped for her  cesarean section.  She was taken to the operating room by Dr. Conley Simmonds on  January 24, 2003 where a primary classical cesarean section was performed  with the delivery of a 2-pound 1-ounce female infant with Apgars of 7 and 8.  There was clear fluid with a large free-floating clot in the amniotic fluid  and in the lower uterine segment.  The baby was taken to the NICU and was  stable, breathing on her own.  The patient's postoperative course was benign  except for some postoperative anemia.  She was started on iron.  The patient  understands her need for future cesarean sections with future pregnancies.  The patient continued to do well, remained afebrile, was felt ready for  discharge on postoperative day #3.  Was sent home on  a regular diet, told  to decrease activity, told to continue prenatal vitamins and her  iron, was  told to use Colace as needed, was given a prescription for pain medicine,  and was to follow up in the office in two weeks.   LABORATORY DATA ON DISCHARGE:  The patient had a hemoglobin of 6.6 and a  white blood cell count of 12.0.     Leilani Able, P.A.-C.                Carrington Clamp, M.D.    MB/MEDQ  D:  03/28/2003  T:  03/28/2003  Job:  [id number]

## 2010-08-15 NOTE — Op Note (Signed)
NAME:  Anna Anderson, Anna Anderson NO.:  [id number]   MEDICAL RECORD NO.:  [id number]                   PATIENT TYPE:  INP   LOCATION:  9322                                 FACILITY:  [facility]   PHYSICIAN:  Randye Lobo, M.D.                DATE OF BIRTH:  Jun 07, 1980   DATE OF PROCEDURE:  01/24/2003  DATE OF DISCHARGE:                                 OPERATIVE REPORT   PREOPERATIVE DIAGNOSES:  1. Intrauterine gestation at 69 plus 2 weeks.  2. Probable partial placental abruption.  3. Preterm labor.  4. Left paraovarian adhesions.   POSTOPERATIVE DIAGNOSES:  1. Intrauterine gestation at 72 plus 2 weeks.  2. Probable partial placental abruption.  3. Preterm labor.  4. Left paraovarian adhesions.   PROCEDURE:  Primary classical cesarean section, lysis of adhesions.   SURGEON:  Randye Lobo, M.D.   ASSISTANT:  Ilda Mori, M.D.   IV FLUIDS:  2500 mL Ringer's lactate.   ESTIMATED BLOOD LOSS:  700 mL   URINE OUTPUT:  250 mL   COMPLICATIONS:  None.   INDICATIONS FOR PROCEDURE:  The patient is a 30 year old, gravida 1, para 0,  African-American female at 43 plus [redacted] weeks gestation who has been an  inpatient at the [facility] since January 19, 2003 for vaginal  bleeding and contractions. The patient was admitted at 26 plus [redacted] weeks  gestation with bleeding and pressure and at that time was noted to be 1 cm  dilated with a shortened cervix measuring approximately 1 length in  thickness. The patient had an ultrasound performed and there was some  concern about a possible marginal placenta previa. The patient had no  ultrasound findings of placental abruption at that time and the fetus was  noted to be in the footling breech presentation. The patient ended up having  a followup ultrasound performed the next day at which time no placenta  previa was appreciated. The patient instead was found to have clot at the  top of the vagina. The patient upon  admission was having contractions and  she was treated with magnesium and sulfate and she did receive a course of  betamethasone steroids. The patient had been monitored closely in the  hospital. An attempt was made to discontinue the magnesium sulfate and place  the patient on Procardia. The patient, however, developed increasing  contractions and her cervix was noted to dilate to 2 cm with 90% effacement.  The magnesium was restarted on January 23, 2003. The patient appeared to be  stable without any further significant bleeding. The fetal heart rate  tracing remained reassuring during the hospitalization.   On January 24, 2003, the patient was noted to have active red bleeding. The  cervix was noted to be 3 cm dilated at that time and no fetal part was  appreciated. The fetal heart rate tracing remained reassuring. The bleeding  was  persistent and there was concern over partial placental abruption, and  the decision was made to proceed with a primary cesarean section after the  risks, benefits, and alternatives were discussed with the patient.   FINDINGS:  A viable female was delivered in the double footling breech  presentation at 15:22. Apgar were 7 at 1 minute, 8 at 5 minutes. The weight  was later noted to be 929 g. The amniotic fluid itself appeared to be clear  with a large free floating clot in the amniotic fluid. There was also clot  noted within the lower uterine segment which was extra amniotic. The  placenta was noted to be posterior. The cord pH was later noted to be 7.33.  The placenta itself appeared to have clot adherent to it. The left ovary was  noted to have filmy adhesions to the posterior lower uterine segment. The  right tube and ovary and the left fallopian tube were unremarkable.   The baby was transferred to the NICU in stable condition and was breathing  on her own.   SPECIMENS:  The placenta was sent to pathology.   DESCRIPTION OF PROCEDURE:  The patient  was taken immediately from her  antenatal suite to the operating room where the OR team and the NICU team  were awaiting. The patient did have a spinal anesthetic as she had recently  eaten a full meal and the fetal heart rate tracing and she were both stable.  The spinal anesthetic was administered. The patient was then placed in the  supine position with a left lateral tilt and the abdomen was sterilely  prepped and draped. The patient had previously had a Foley catheter placed  in labor and delivery.   The patient was sterilely draped and a Pfannenstiel incision was created  sharply with the scalpel after anesthesia was found to be adequate. This was  carried down to the fascia using sharp dissection with the scalpel. The  fascial incision was extended bilaterally with the Mayo scissors. The rectus  muscles were dissected off the fascia using sharp dissection. The rectus  muscles were divided bluntly and the parietoperitoneum was elevated with two  hemostat clamps and entered sharply with the Metzenbaum scissors. The  peritoneal incision was extended inferiorly using the Metzenbaum scissors.   The lower uterine segment was exposed with the bladder retractor and the  bladder flap was created sharply. The lower uterine segment was not well  developed and a decision was made to proceed with a classical uterine  incision based on the lack of development of the lower uterine segment and  the double footling breech presentation. The incision was created sharply in  the uterus with the scalpel. Membranes were identified and were ruptured  bluntly. The uterine incision was then extended cranially and caudally with  the bandage scissors. The double footling breech was appreciated and the two  feet were grasped gently and then held with a lap pad. The legs were  delivered without difficulty followed by the babies right arm and shoulder and then the left arm and shoulder. The head was delivered  last again  without difficulty. The nares and mouth were suctioned and the cord was  doubly clamped and cut, the newborn was carried over to the NICU team in  good condition and was breathing spontaneously. Cord gas and cord blood were  obtained at this time and the placenta was then extracted. It was sent to  pathology.   The uterus was exteriorized  for its closure. A moistened lap pad was used to  remove any remaining membranes and clot from within the uterine cavity. The  clot was removed as much as possible from the lower uterine segment. The  uterine incision was closed at this time. It was closed in a two layer  closure of #1 chromic. The first was a running locked layer. The second was  a baseball stitch along the frontal portion of the incision and then was  actually more of an imbricating stitch on the inferior aspect of the  incision where the bladder flap had been created, removing the serosa from  this region. There was a small amount of bleeding which was noted in the mid  portion of the incision, and a simple transverse suture of #1 chromic was  placed for excellent hemostasis.   The peritoneal cavity was then irrigated and suctioned. The adhesions from  the left ovary to the posterior lower uterine segment were lysed bluntly.  The operative sites were examined and there was no active bleeding in this  area. With the uterus returned to the peritoneal cavity, the incision was  reexamined and there was no evidence of bleeding.   The abdomen was closed. The parietoperitoneum was closed with a running  suture of 3-0 Vicryl. The under portion of the peritoneal closure was  examined digitally and there was no evidence of any omentum which had been  incorporated into this closure. The rectus muscles were then reapproximated  with figure-of-eight sutures of #1 chromic. The fascia was closed with a  running locked suture of #0 Vicryl. The subcutaneous tissue was irrigated  and  suctioned and made hemostatic with monopolar cautery. The skin was  closed with staples. A sterile bandage was placed over the incision. There  were no complications to this procedure. All needle, instrument, and sponge  counts are correct.   Due to the patient's classical uterine incision, she will not be a candidate  for labor in the future and a repeat cesarean section will be recommended  for future deliveries.                                               Randye Lobo, M.D.    BES/MEDQ  D:  01/24/2003  T:  01/24/2003  Job:  [id number]

## 2010-12-24 LAB — POCT PREGNANCY, URINE: Operator id: 242691

## 2010-12-24 LAB — WET PREP, GENITAL
Trich, Wet Prep: NONE SEEN
Yeast Wet Prep HPF POC: NONE SEEN

## 2010-12-24 LAB — URINALYSIS, ROUTINE W REFLEX MICROSCOPIC
Ketones, ur: NEGATIVE
Nitrite: NEGATIVE
Protein, ur: 30 — AB
pH: 6

## 2010-12-24 LAB — GC/CHLAMYDIA PROBE AMP, GENITAL: GC Probe Amp, Genital: NEGATIVE

## 2011-09-25 ENCOUNTER — Emergency Department (INDEPENDENT_AMBULATORY_CARE_PROVIDER_SITE_OTHER)
Admission: EM | Admit: 2011-09-25 | Discharge: 2011-09-25 | Disposition: A | Discharge status: Home / Self Care | Payer: Medicaid Other | Source: Home / Self Care | Attending: Emergency Medicine | Admitting: Emergency Medicine

## 2011-09-25 ENCOUNTER — Encounter (HOSPITAL_COMMUNITY): Payer: Self-pay | Admitting: *Deleted

## 2011-09-25 DIAGNOSIS — K529 Noninfective gastroenteritis and colitis, unspecified: Secondary | ICD-10-CM

## 2011-09-25 DIAGNOSIS — I10 Essential (primary) hypertension: Secondary | ICD-10-CM

## 2011-09-25 DIAGNOSIS — K5289 Other specified noninfective gastroenteritis and colitis: Secondary | ICD-10-CM

## 2011-09-25 HISTORY — DX: Essential (primary) hypertension: I10

## 2011-09-25 MED ORDER — LOSARTAN POTASSIUM 50 MG PO TABS: 50.0000 mg | ORAL_TABLET | Freq: Every day | ORAL | Status: DC

## 2011-09-25 NOTE — Discharge Instructions (Signed)
Drink LOTS of liquids.  Try Imodium for your diarrhea (follow the instructions on the package).  It is EXTREMELY important that you follow up with the [facility] about your blood pressure.  Your blood pressure is so high, you will likely need more than one medicine to get it under control.  Start with the losartan (cozaar) and talk with your family practice doctor about the next step in controlling your blood pressure.  Make an appointment with family practice for early next week.   Clear Liquid Diet The clear liquid dietconsists of foods that are liquid or will become liquid at room temperature.You should be able to see through the liquid and beverages. Examples of foods allowed on a clear liquid diet include fruit juice, broth or bouillon, gelatin, or frozen ice pops. The purpose of this diet is to provide necessary fluid, electrolytes such as sodium and potassium, and energy to keep the body functioning during times when you are not able to consume a regular diet.A clear liquid diet should not be continued for long periods of time as it is not nutritionally adequate.  REASONS FOR USING A CLEAR LIQUID DIET  In sudden onset (acute) conditions for a patient before or after surgery.   As the first step in oral feeding.   For fluid and electrolyte replacement in diarrheal diseases.   As a diet before certain medical tests are performed.  ADEQUACY The clear liquid diet is adequate only in ascorbic acid, according to the Recommended Dietary Allowances of the [employer]. CHOOSING FOODS Breads and Starches  Allowed:  None are allowed.   Avoid: All are avoided.  Vegetables  Allowed:  Strained tomato or vegetable juice.   Avoid: Any others.  Fruit  Allowed:  Strained fruit juices and fruit drinks. Include 1 serving of citrus or vitamin C-enriched fruit juice daily.   Avoid: Any others.  Meat and Meat Substitutes  Allowed:  None are allowed.   Avoid: All are  avoided.  Milk  Allowed:  None are allowed.   Avoid: All are avoided.  Soups and Combination Foods  Allowed:  Clear bouillon, broth, or strained broth-based soups.   Avoid: Any others.  Desserts and Sweets  Allowed:  Sugar, honey. High protein gelatin. Flavored gelatin, ices, or frozen ice pops that do not contain milk.   Avoid: Any others.  Fats and Oils  Allowed:  None are allowed.   Avoid: All are avoided.  Beverages  Allowed: Cereal beverages, coffee (regular or decaffeinated), tea, or soda at the discretion of your caregiver.   Avoid: Any others.  Condiments  Allowed:  Iodized salt.   Avoid: Any others, including pepper.  Supplements  Allowed:  Liquid nutrition beverages.   Avoid: Any others that contain lactose or fiber.  SAMPLE MEAL PLAN Breakfast  4 oz (120 mL) strained orange juice.    to 1 cup (125 to 250 mL) gelatin (plain or fortified).   1 cup (250 mL) beverage (coffee or tea).   Sugar, if desired.  Midmorning Snack   cup (125 mL) gelatin (plain or fortified).  Lunch  1 cup (250 mL) broth or consomm.   4 oz (120 mL) strained grapefruit juice.    cup (125 mL) gelatin (plain or fortified).   1 cup (250 mL) beverage (coffee or tea).   Sugar, if desired.  Midafternoon Snack   cup (125 mL) fruit ice.    cup (125 mL) strained fruit juice.  Dinner  1 cup (250  mL) broth or consomm.    cup (125 mL) cranberry juice.    cup (125 mL) flavored gelatin (plain or fortified).   1 cup (250 mL) beverage (coffee or tea).   Sugar, if desired.  Evening Snack  4 oz (120 mL) strained apple juice (vitamin C-fortified).    cup (125 mL) flavored gelatin (plain or fortified).  Document Released: 03/16/2005 Document Revised: 03/05/2011 Document Reviewed: 06/13/2010 [facility] Patient Information 2012 [place], [place]. B.R.A.T. Diet Your doctor has recommended the B.R.A.T. diet for you or your child until the condition improves. This is often  used to help control diarrhea and vomiting symptoms. If you or your child can tolerate clear liquids, you may have:  Bananas.   Rice.   Applesauce.   Toast (and other simple starches such as crackers, potatoes, noodles).  Be sure to avoid dairy products, meats, and fatty foods until symptoms are better. Fruit juices such as apple, grape, and prune juice can make diarrhea worse. Avoid these. Continue this diet for 2 days or as instructed by your caregiver. Document Released: 03/16/2005 Document Revised: 03/05/2011 Document Reviewed: 09/02/2006 [facility] Patient Information 2012 [place], [place].

## 2011-09-25 NOTE — ED Notes (Signed)
Pt  Reports  Symptoms  Of  Body  Aches    Had  Fever  Earlier       Weak  Some  Diarrhea  As  Well    Pt  Reports  Symptoms  Began  sev  Days  Ago  -  She  Also  Reports  She  Has  A history of  htn  And  Stopped  Her  bp  meds      -  Her  bp is  High  Now  -  She  Also   Reports  intyermittant   Headache  As  Well

## 2011-09-26 ENCOUNTER — Encounter (HOSPITAL_COMMUNITY): Payer: Self-pay | Admitting: Emergency Medicine

## 2011-09-26 NOTE — ED Provider Notes (Signed)
History     CSN: [id number]  Arrival date & time 09/25/11  1420   First MD Initiated Contact with Patient 09/25/11 1612      Chief Complaint  Patient presents with  . Generalized Body Aches    (Consider location/radiation/quality/duration/timing/severity/associated sxs/prior treatment) HPI Comments: Pt's 27 month old twins recently had similar diarrhea sx. Describes abd pain as cramping pain.  Pt dx with HTN 2 years ago, was on medications (cozaar and norvasc) but didn't like the way they made her feel so she stopped taking them.    Patient is a 31 y.o. female presenting with diarrhea and hypertension. The history is provided by the patient.  Diarrhea The primary symptoms include abdominal pain and diarrhea. Primary symptoms do not include fever, nausea, vomiting or dysuria. The illness began yesterday. The onset was sudden. The problem has not changed since onset. The abdominal pain began yesterday. The abdominal pain is generalized. The abdominal pain does not radiate. The severity of the abdominal pain is 4/10. The abdominal pain is relieved by nothing.  The diarrhea began yesterday. The diarrhea is watery. The diarrhea occurs 2 to 4 times per day.  The illness does not include chills, constipation or back pain.  Hypertension This is a chronic problem. Episode onset: 2 years ago. Associated symptoms include abdominal pain and headaches. Pertinent negatives include no chest pain and no shortness of breath. Nothing aggravates the symptoms. Nothing relieves the symptoms. She has tried nothing for the symptoms.    Past Medical History  Diagnosis Date  . Hypertension     History reviewed. No pertinent past surgical history.  History reviewed. No pertinent family history.  History  Substance Use Topics  . Smoking status: Not on file  . Smokeless tobacco: Not on file  . Alcohol Use:     OB History    Grav Para Term Preterm Abortions TAB SAB Ect Mult Living                   Review of Systems  Constitutional: Negative for fever and chills.  Respiratory: Negative for shortness of breath and wheezing.   Cardiovascular: Negative for chest pain, palpitations and leg swelling.  Gastrointestinal: Positive for abdominal pain, diarrhea and abdominal distention. Negative for nausea, vomiting and constipation.  Genitourinary: Negative for dysuria.  Musculoskeletal: Negative for back pain.  Neurological: Positive for headaches.    Allergies  Amoxicillin  Home Medications   Current Outpatient Rx  Name Route Sig Dispense Refill  . HYDROCHLOROTHIAZIDE 12.5 MG PO CAPS Oral Take 12.5 mg by mouth every morning.      Marland Kitchen LABETALOL HCL 100 MG PO TABS Oral Take 100 mg by mouth 2 (two) times daily.      Marland Kitchen LOSARTAN POTASSIUM 50 MG PO TABS Oral Take 1 tablet (50 mg total) by mouth daily. 30 tablet 0    BP 160/118  Pulse 84  Temp 98.6 F (37 C) (Oral)  Resp 18  SpO2 100%  LMP 08/25/2011  Physical Exam  Constitutional: She is oriented to person, place, and time. She appears well-developed and well-nourished. No distress.  Cardiovascular: Normal rate and regular rhythm.   Pulmonary/Chest: Effort normal and breath sounds normal.  Abdominal: Soft. Bowel sounds are normal. She exhibits no distension. There is generalized tenderness. There is no rigidity, no rebound, no guarding and no CVA tenderness.       abd tenderness is mild  Neurological: She is alert and oriented to person, place, and time. Coordination  and gait normal.       No facial asymmetry or one sided weakness    ED Course  Procedures (including critical care time)  Labs Reviewed - No data to display No results found.   1. Hypertension   2. Enteritis       MDM  Discussed at length pt's bp and consequences of untreated HTN.  Pt agrees to f/u with pcp and agrees to restart medication.  Will rx cozaar only today; this is likely to be inadequate for HTN control, but pt may benefit from a step-wise  reduction of bp.  Pt to f/u with pcp this week.  Reviewed stroke sx and reasons for calling 911/going to ER.         Cathlyn Parsons, NP 09/26/11 2157

## 2011-09-27 NOTE — ED Provider Notes (Signed)
Medical screening examination/treatment/procedure(s) were performed by non-physician practitioner and as supervising physician I was immediately available for consultation/collaboration.  Leslee Home, M.D.   Reuben Likes, MD 09/27/11 [phone]

## 2011-09-28 ENCOUNTER — Encounter: Payer: Self-pay | Admitting: Family Medicine

## 2011-09-28 ENCOUNTER — Ambulatory Visit (INDEPENDENT_AMBULATORY_CARE_PROVIDER_SITE_OTHER): Payer: Medicaid Other | Admitting: Family Medicine

## 2011-09-28 VITALS — BP 161/119 | HR 79 | Temp 98.2°F | Ht 63.5 in | Wt 214.0 lb

## 2011-09-28 DIAGNOSIS — A088 Other specified intestinal infections: Secondary | ICD-10-CM

## 2011-09-28 DIAGNOSIS — A084 Viral intestinal infection, unspecified: Secondary | ICD-10-CM | POA: Insufficient documentation

## 2011-09-28 DIAGNOSIS — I10 Essential (primary) hypertension: Secondary | ICD-10-CM

## 2011-09-28 MED ORDER — LISINOPRIL 20 MG PO TABS: 20.0000 mg | ORAL_TABLET | Freq: Every day | ORAL | Status: DC

## 2011-09-28 MED ORDER — HYDROCHLOROTHIAZIDE 25 MG PO TABS: 25.0000 mg | ORAL_TABLET | Freq: Every day | ORAL | Status: DC

## 2011-09-28 NOTE — Assessment & Plan Note (Signed)
Diarrhea x2-3 days with some fever, well controlled by tylenol/motrin. Encouraged pt to continue to drink fluids, and that diarrhea should resolve on its own. Appears well hydrated.

## 2011-09-28 NOTE — Patient Instructions (Signed)
It was great meeting you today. We are starting two new blood pressure medications today:  1. Hydrochlorothiazide 2. Lisinopril  Take these medications every morning. If you have any facial swelling or difficulty breathing, stop taking the medication and go to the emergency room.  Your diarrhea is likely viral and will resolve on its own. Continue drinking plenty of fluids and alternating tylenol/motrin for fever.  Make an appointment for one week from today.

## 2011-09-28 NOTE — Assessment & Plan Note (Addendum)
Has had persistently elevated BP's in the past, not taking medications currently. Will rx hydrochlorothiazide and lisinopril and have her return to clinic in one week for labs and blood pressure check. I do not see any signs of hypertensive emergency or evidence of acute end-organ damage. Will assess her renal function when she returns in one week for follow up and labs.

## 2011-09-28 NOTE — Progress Notes (Signed)
HYPERTENSION Has had hypertension in the past, but is not taking any medications. She was seen in urgent care several days ago for a GI bug, and they referred her here due to uncontrolled HTN. Does have headaches with some mild vision changes. No chest pain or palpitations, does occasionally feel her heart racing. Some lightheadedness recently, but has also had GI bug. A few days ago she lost feeling in her left hand for a few seconds, and it resolved. In the past has taken Norvasc but did not tolerate it due to sexual side effects.   DIARRHEA Children attend daycare and came home with diarrhea. Pt has had it for 2-3 days. Has had decreased appetite but is able to eat and drink. Reports fever to 103.4 that is well controlled with alternating Motrin & Tylenol. Also has had some congestion, coughing, body aches.  ROS - See HPI  PMH Hypertension Pre-Eclampsia PCOS   Physical Exam: HEENT - moist mucous membranes Abd - soft, nontender, nondistended Heart - Regular rate and rhythm.  No murmurs, gallops or rubs.  No carotid bruits Pulmonary - CTAB, no wheezes or crackles Ext - no lower extremity edema

## 2011-09-29 ENCOUNTER — Encounter (HOSPITAL_COMMUNITY): Payer: Self-pay | Admitting: *Deleted

## 2011-10-05 ENCOUNTER — Telehealth (HOSPITAL_COMMUNITY): Payer: Self-pay | Admitting: *Deleted

## 2011-10-05 ENCOUNTER — Ambulatory Visit: Payer: Medicaid Other | Admitting: Family Medicine

## 2011-10-05 NOTE — ED Notes (Signed)
Fax received from [facility] on [address] saying the Losartan Potassium requires a prior authorization.  We don't do those.  I discussed with Dr. Tressia Danas and told her it looks like pt. went to see her PCP on 7/1 and got Lisinopril and HCTZ.  She said to call pt. and tell her if she filled the other Rx. She does not need to be on both.  I called pt. and left a message to call. I called [facility] and the pharmacist said the pt. has filled the Lisinopril and HCTZ. I told her we are not going to do the prior authorization on the Losartan and cancelled it, so pt. does not try to fill and take both. Vassie Moselle 10/05/2011

## 2012-06-23 ENCOUNTER — Emergency Department (HOSPITAL_COMMUNITY)
Admission: EM | Admit: 2012-06-23 | Discharge: 2012-06-23 | Disposition: A | Discharge status: Home / Self Care | Payer: Medicaid Other | Attending: Emergency Medicine | Admitting: Emergency Medicine

## 2012-06-23 DIAGNOSIS — R059 Cough, unspecified: Secondary | ICD-10-CM | POA: Insufficient documentation

## 2012-06-23 DIAGNOSIS — R05 Cough: Secondary | ICD-10-CM | POA: Insufficient documentation

## 2012-06-23 DIAGNOSIS — IMO0001 Reserved for inherently not codable concepts without codable children: Secondary | ICD-10-CM | POA: Insufficient documentation

## 2012-06-23 DIAGNOSIS — R42 Dizziness and giddiness: Secondary | ICD-10-CM | POA: Insufficient documentation

## 2012-06-23 DIAGNOSIS — R5383 Other fatigue: Secondary | ICD-10-CM | POA: Insufficient documentation

## 2012-06-23 DIAGNOSIS — R5381 Other malaise: Secondary | ICD-10-CM | POA: Insufficient documentation

## 2012-06-23 DIAGNOSIS — R509 Fever, unspecified: Secondary | ICD-10-CM | POA: Insufficient documentation

## 2012-06-23 DIAGNOSIS — R6889 Other general symptoms and signs: Secondary | ICD-10-CM | POA: Insufficient documentation

## 2012-06-23 DIAGNOSIS — J3489 Other specified disorders of nose and nasal sinuses: Secondary | ICD-10-CM | POA: Insufficient documentation

## 2012-06-23 DIAGNOSIS — J069 Acute upper respiratory infection, unspecified: Secondary | ICD-10-CM

## 2012-06-23 DIAGNOSIS — R51 Headache: Secondary | ICD-10-CM | POA: Insufficient documentation

## 2012-06-23 DIAGNOSIS — R6883 Chills (without fever): Secondary | ICD-10-CM | POA: Insufficient documentation

## 2012-06-23 DIAGNOSIS — R110 Nausea: Secondary | ICD-10-CM | POA: Insufficient documentation

## 2012-06-23 DIAGNOSIS — E876 Hypokalemia: Secondary | ICD-10-CM

## 2012-06-23 DIAGNOSIS — I10 Essential (primary) hypertension: Secondary | ICD-10-CM | POA: Insufficient documentation

## 2012-06-23 LAB — POCT I-STAT, CHEM 8
BUN: 9 mg/dL (ref 6–23)
Calcium, Ion: 1.17 mmol/L (ref 1.12–1.23)
Chloride: 105 mEq/L (ref 96–112)
Creatinine, Ser: 0.8 mg/dL (ref 0.50–1.10)
Glucose, Bld: 103 mg/dL — ABNORMAL HIGH (ref 70–99)

## 2012-06-23 MED ORDER — LISINOPRIL 20 MG PO TABS: 20.0000 mg | ORAL_TABLET | Freq: Every day | ORAL | Status: DC

## 2012-06-23 MED ORDER — ONDANSETRON 4 MG PO TBDP
4.0000 mg | ORAL_TABLET | Freq: Once | ORAL | Status: AC
  Administered 2012-06-23: 4 mg via ORAL
  Filled 2012-06-23: qty 1

## 2012-06-23 MED ORDER — SODIUM CHLORIDE 0.9 % IV BOLUS (SEPSIS): 1000.0000 mL | Freq: Once | INTRAVENOUS | Status: DC

## 2012-06-23 MED ORDER — HYDROCHLOROTHIAZIDE 25 MG PO TABS: 25.0000 mg | ORAL_TABLET | Freq: Every day | ORAL | Status: DC

## 2012-06-23 MED ORDER — POTASSIUM CHLORIDE CRYS ER 20 MEQ PO TBCR
20.0000 meq | EXTENDED_RELEASE_TABLET | Freq: Once | ORAL | Status: AC
  Administered 2012-06-23: 20 meq via ORAL
  Filled 2012-06-23: qty 1

## 2012-06-23 NOTE — ED Notes (Signed)
Pt reports having fever, chills and general body aches for the past 3 days.

## 2012-06-23 NOTE — ED Provider Notes (Signed)
History     CSN: [id number]  Arrival date & time 06/23/12  0941   First MD Initiated Contact with Patient 06/23/12 [phone]      Chief Complaint  Patient presents with  . Generalized Body Aches    (Consider location/radiation/quality/duration/timing/severity/associated sxs/prior treatment) HPI Comments: 32 y.o. Female complaing of cough since Sunday. Later that night, body aches, congestion, getting worse. Yesterday pt states body aches more than normal, wasn't getting better.   Pt admits dry cough with occasional phlegm. Makes her chest hurt when she coughs. Sneezing, stuffy nose.   Took alka seltzer plus. Helps her sleep, but doesn't help body aches.   Denies sore throat.  Pt is followed at [facility] but hasn't been there in more than 6 months. She has been out of her blood pressure medication for about 3 months.    Past Medical History  Diagnosis Date  . Hypertension     Past Surgical History  Procedure Laterality Date  . Tubal ligation      No family history on file.  History  Substance Use Topics  . Smoking status: Never Smoker   . Smokeless tobacco: Not on file  . Alcohol Use: Not on file    OB History   Grav Para Term Preterm Abortions TAB SAB Ect Mult Living                  Review of Systems  Constitutional: Positive for chills. Negative for fever, diaphoresis and appetite change.  HENT: Positive for congestion and sneezing. Negative for ear pain, sore throat, neck pain and neck stiffness.   Eyes: Negative for visual disturbance.  Respiratory: Positive for cough. Negative for chest tightness and shortness of breath.        Dry cough, sometimes productive  Cardiovascular: Negative for chest pain and palpitations.  Gastrointestinal: Positive for nausea. Negative for vomiting, diarrhea and constipation.  Genitourinary: Negative for dysuria, flank pain, vaginal discharge and pelvic pain.  Musculoskeletal: Positive for myalgias. Negative  for gait problem.  Skin: Negative for rash.  Neurological: Positive for dizziness, weakness and headaches. Negative for light-headedness and numbness.    Allergies  Amoxicillin  Home Medications   Current Outpatient Rx  Name  Route  Sig  Dispense  Refill  . Phenyleph-CPM-DM-Aspirin (ALKA-SELTZER PLUS COLD & COUGH PO)   Oral   Take 2 tablets by mouth every 6 (six) hours as needed.           BP 163/125  Pulse 91  Temp(Src) 98.6 F (37 C) (Oral)  Resp 18  SpO2 100%  Physical Exam  Nursing note and vitals reviewed. Constitutional: She is oriented to person, place, and time. She appears well-developed and well-nourished. No distress.  HENT:  Head: Normocephalic and atraumatic.  Eyes: Conjunctivae and EOM are normal.  Neck: Normal range of motion. Neck supple.  No meningeal signs  Cardiovascular: Normal rate, regular rhythm and normal heart sounds.  Exam reveals no gallop and no friction rub.   No murmur heard. Pulmonary/Chest: Effort normal and breath sounds normal. No respiratory distress. She has no wheezes. She has no rales. She exhibits no tenderness.  Abdominal: Soft. Bowel sounds are normal. She exhibits no distension. There is no tenderness. There is no rebound and no guarding.  Musculoskeletal: Normal range of motion. She exhibits no edema and no tenderness.  Neurological: She is alert and oriented to person, place, and time. No cranial nerve deficit.  Skin: Skin is warm and dry. She  is not diaphoretic. No erythema.    ED Course  Procedures (including critical care time)  Labs Reviewed  POCT I-STAT, CHEM 8 - Abnormal; Notable for the following:    Potassium 3.1 (*)    Glucose, Bld 103 (*)    All other components within normal limits     Date: 06/23/2012  Rate: 81  Rhythm: sinus rhythm  QRS Axis: normal  Intervals: normal  ST/T Wave abnormalities: normal  Conduction Disutrbances: none  Narrative Interpretation: Normal EKG  Old EKG Reviewed: 10/19/2007,  no changes   No results found.   1. Upper respiratory infection   2. Hypokalemia       MDM  Likely viral etiology.  Lungs CTA.  BP high as pt has been non-compliant for 3 months. Will give IVF and send home with prescriptions for Lisinopril and HCTZ with instructions to follow up with primary care. Will get i-stat Chem 8 to check potassium.   Mom had MI, had triple bypass this past year. Pt concerned for chest pain (even though secondary to cough) so will get EKG to reassure.   EKG reassuring. Potassium showed 3.1, so gave Klor Con 20.   At this time there does not appear to be any evidence of an acute emergency medical condition and the patient appears stable for discharge with appropriate outpatient follow up.Diagnosis was discussed with patient who verbalizes understanding and is agreeable to discharge. Pt case discussed with Dr. Jeraldine Loots who agrees with my plan.    [place]ade Nurse, PA-C 06/23/12 1205

## 2012-06-23 NOTE — ED Provider Notes (Signed)
  Medical screening examination/treatment/procedure(s) were performed by non-physician practitioner and as supervising physician I was immediately available for consultation/collaboration.    Jamarian Jacinto, MD 06/23/12 1621 

## 2012-06-27 ENCOUNTER — Telehealth: Payer: Self-pay | Admitting: Family Medicine

## 2012-06-27 NOTE — Telephone Encounter (Signed)
Pt stop by to make an apt but to also ask that her blood pressure medicine be changed.

## 2012-06-28 NOTE — Telephone Encounter (Signed)
Tried to call pt. Number is incorrect. If pt calls back, please tell pt to keep appt. We DO NOT change HTN meds without OV. Lorenda Hatchet, Renato Battles

## 2012-06-29 NOTE — Telephone Encounter (Signed)
Patient has appointment on April 9, can discuss BP meds at that visit.Anna Anderson, Anna Anderson

## 2012-07-06 ENCOUNTER — Ambulatory Visit (INDEPENDENT_AMBULATORY_CARE_PROVIDER_SITE_OTHER): Payer: Medicaid Other | Admitting: Family Medicine

## 2012-07-06 ENCOUNTER — Encounter: Payer: Self-pay | Admitting: Family Medicine

## 2012-07-06 VITALS — BP 160/102 | HR 89 | Temp 98.0°F | Ht 63.5 in | Wt 222.0 lb

## 2012-07-06 DIAGNOSIS — I10 Essential (primary) hypertension: Secondary | ICD-10-CM

## 2012-07-06 LAB — BASIC METABOLIC PANEL
BUN: 11 mg/dL (ref 6–23)
CO2: 27 mEq/L (ref 19–32)
Calcium: 9.3 mg/dL (ref 8.4–10.5)
Creat: 0.79 mg/dL (ref 0.50–1.10)
Glucose, Bld: 91 mg/dL (ref 70–99)

## 2012-07-06 MED ORDER — METOPROLOL TARTRATE 25 MG PO TABS: 25.0000 mg | ORAL_TABLET | Freq: Two times a day (BID) | ORAL | Status: DC

## 2012-07-06 NOTE — Patient Instructions (Addendum)
It was nice to meet you today.  I am starting you on a new blood pressure pill.  Take it 2 times per day.  If you are getting dizzy every time you stand up, take 1/2 pill 2 times per day instead of a whole pill.  I am also rechecking your potassium.   Come back to see me or Dr. Pollie Meyer in 2-3 weeks.  Wait to go back to the diet clinic until we get your blood pressure under better control.

## 2012-07-06 NOTE — Assessment & Plan Note (Addendum)
Not taking lisino/HCTZ x6 months because she was easily fatigued and felt like her potassium was low.  Did have low K at UC 1-2 weeks ago (may be where she got this idea from), but had already stopped her meds for many months.  Would like to avoid both lisino and HCTZ despite long discussion that lisino does not affect K.  Will start metoprolol 25 BID with instructions to decreased to 12.5 BID if hypotension occurs.  F/u in 2-3 weeks. Aware she should not restart diet clinic until BP under better control.

## 2012-07-06 NOTE — Progress Notes (Signed)
S: Pt comes in today for HTN.  HYPERTENSION BP: 160/102 Meds: lisino 20, HCTZ 25 --> d/c'ed 6 months ago b/c felt like her K was getting low- would feel really tired really fast and felt like it was from her BP meds; wants completely new medicine  Taking meds: No    # of doses missed/week: all Symptoms: Headache: Yes : when she feels like her BP is high Dizziness: No Vision changes: Yes : some blurry vision with HA SOB:  No Chest pain: No LE swelling: No Tobacco use: No -Would like to start going to the diet clinic again because she wants to work on weight loss.    ROS: Per HPI  History  Smoking status  . Never Smoker   Smokeless tobacco  . Not on file    O:  Filed Vitals:   07/06/12 0932  BP: 160/102  Pulse: 89  Temp: 98 F (36.7 C)    Gen: NAD CV: RRR, no murmur Pulm: CTA bilat, no wheezes or crackles Ext: Warm, no edema   A/P: 32 y.o. female p/w HTN, off medications -See problem list -f/u in 2-3 weeks

## 2012-07-11 ENCOUNTER — Telehealth: Payer: Self-pay | Admitting: *Deleted

## 2012-07-11 NOTE — Telephone Encounter (Signed)
F/U health coach call to check on HTN management. Pt reports taking "1/2 BP pill 2x day" Reports Bp is better but cannot remember what it was. Plans on starting zumba this week. Instructed not to return to wt loss clinic until BP better controlled. Will RTC at the end of April.

## 2012-12-12 ENCOUNTER — Telehealth: Payer: Self-pay | Admitting: Family Medicine

## 2012-12-12 NOTE — Telephone Encounter (Signed)
Called pt. Advised to schedule OV with her new PCP and pt agreed. (unable to call in or change any BP meds w/out being seen) .Arlyss Repress

## 2012-12-12 NOTE — Telephone Encounter (Signed)
Pt called because her BP medication is messing with her potassium and she would like something else called in. JW

## 2012-12-15 ENCOUNTER — Ambulatory Visit (INDEPENDENT_AMBULATORY_CARE_PROVIDER_SITE_OTHER): Payer: Medicaid Other | Admitting: Family Medicine

## 2012-12-15 ENCOUNTER — Encounter: Payer: Self-pay | Admitting: Family Medicine

## 2012-12-15 VITALS — BP 176/130 | HR 76 | Temp 99.1°F | Wt 221.0 lb

## 2012-12-15 DIAGNOSIS — IMO0001 Reserved for inherently not codable concepts without codable children: Secondary | ICD-10-CM

## 2012-12-15 DIAGNOSIS — M791 Myalgia, unspecified site: Secondary | ICD-10-CM

## 2012-12-15 DIAGNOSIS — I10 Essential (primary) hypertension: Secondary | ICD-10-CM

## 2012-12-15 DIAGNOSIS — E669 Obesity, unspecified: Secondary | ICD-10-CM

## 2012-12-15 MED ORDER — LISINOPRIL-HYDROCHLOROTHIAZIDE 10-12.5 MG PO TABS: 1.0000 | ORAL_TABLET | Freq: Every day | ORAL | Status: DC

## 2012-12-15 NOTE — Progress Notes (Signed)
Subjective:    Anna Anderson is a 32 y.o. female who presents to [facility] today for myalgias:  1.  Myalgias:  Present x 2-3 weeks, since she restarted her Metoprolol.  Had trouble with shoulder, thigh myalgias when on this in past.  States that it dropped "my potassium too low."  Was on this for about 3 weeks back in April but stopped throughout summer.  BP has been high that entire time.  Myalgias completely resolved off of metoprolol.  Restarted b/c she was concerned about prolonged HTN and myalgias returned.  NO fevers/chills.  SLow gradual weight gain.  No neck stiffness.     2.  Hypertension:  Long-term problem for this patient.  No adverse effects from medication.  Not checking it regularly.  Has had some headaches when pressures run high.  Denies any CP, dizziness, shortness of breath, palpitations, or LE swelling.   BP Readings from Last 3 Encounters:  12/15/12 176/130  07/06/12 160/102  06/23/12 162/122    In the past, been on Cozaar, Norvasc, and Lisinopril/HCTZ..  Blood pressure dropped too low at that point and she was taken off of them at that point.   Started on Metoprolol in April as she had exact same concerns with Lisinpril/HCTZ (potassium dropping).  However she is adamant today that Metorpolol and not others dropped K+ and would like to retry Lisinopril/HCTZ since it worked so well.     3.  Obesity:  Patient asking for Belviq today.  Has attempted increasing fruits, vegetables, and exercise.  Had friend who tried Belviq with weight loss.    The following portions of the patient's history were reviewed and updated as appropriate: allergies, current medications, past medical history, family and social history, and problem list. Patient is a nonsmoker.    PMH reviewed.  Past Medical History  Diagnosis Date  . Hypertension    Past Surgical History  Procedure Laterality Date  . Tubal ligation      Medications reviewed. Current Outpatient Prescriptions  Medication Sig  Dispense Refill  . metoprolol tartrate (LOPRESSOR) 25 MG tablet Take 1 tablet (25 mg total) by mouth 2 (two) times daily.  60 tablet  0  . Phenyleph-CPM-DM-Aspirin (ALKA-SELTZER PLUS COLD & COUGH PO) Take 2 tablets by mouth every 6 (six) hours as needed.       No current facility-administered medications for this visit.    ROS as above otherwise neg.  No chest pain, palpitations, SOB, Fever, Chills, Abd pain, N/V/D.   Objective:   Physical Exam BP 176/130  Pulse 76  Temp(Src) 99.1 F (37.3 C) (Oral)  Wt 221 lb (100.245 kg)  BMI 38.53 kg/m2  LMP 11/14/2012 Gen:  Alert, cooperative patient who appears stated age in no acute distress.  Vital signs reviewed. HEENT: EOMI,  MMM Neck:  No thyromegaly.   Cardiac:  Regular rate and rhythm with very slight systolic murmur noted  Pulm:  Clear to auscultation bilaterally with good air movement.  No wheezes or rales noted.   Abd:  Obese/NT.  No masses noted.   Exts: Non edematous BL  LE, warm and well perfused.  MSK:  No current tenderness throughout shoulders/thights.  STrenght 5/5 BL upper and lower extremities throughout.   No results found for this or any previous visit (from the past 72 hour(s)).

## 2012-12-15 NOTE — Assessment & Plan Note (Signed)
Seems to be recurrent and not entirely dependent on metoprolol based on chart review.  Will check CK next visit. Resolved s/p cessation of Metoprolol.   Polymyositis a possibility, though low liklihood.

## 2012-12-15 NOTE — Patient Instructions (Signed)
Take the new blood pressure medicine today.  Come back in 2 weeks for lab visit and nurse visit for blood pressure check.    At that point we'll probably increase the blood pressure medicine.

## 2012-12-15 NOTE — Assessment & Plan Note (Addendum)
Conversation about how these meds should not affect potassium.   Will switch back to Lis/HCTZ as this seems to have helped in past.   FU in 2 weeks creatinine/BP check.   Check TSH at that time as well.   See myalgias below.    Also, takes alka seltzer with phenylephrine. Discussed how this will increase BP.  Encouraged to stop totally.

## 2012-12-15 NOTE — Assessment & Plan Note (Signed)
Hold off on Belviq until blood pressure improves and she can have conversation about this with PCP. Little exercise ("I get enough at work.")  Encouraged to do some outside of work as well.   Continue increasing fruits/veggies as much as possible.

## 2012-12-29 ENCOUNTER — Other Ambulatory Visit: Payer: Medicaid Other

## 2013-05-01 ENCOUNTER — Encounter: Payer: Self-pay | Admitting: Family Medicine

## 2013-05-01 ENCOUNTER — Ambulatory Visit (INDEPENDENT_AMBULATORY_CARE_PROVIDER_SITE_OTHER): Payer: Medicaid Other | Admitting: Family Medicine

## 2013-05-01 VITALS — BP 178/102 | HR 83 | Temp 98.1°F | Resp 18 | Ht 62.4 in | Wt 226.0 lb

## 2013-05-01 DIAGNOSIS — IMO0001 Reserved for inherently not codable concepts without codable children: Secondary | ICD-10-CM

## 2013-05-01 DIAGNOSIS — I10 Essential (primary) hypertension: Secondary | ICD-10-CM

## 2013-05-01 DIAGNOSIS — Z13220 Encounter for screening for lipoid disorders: Secondary | ICD-10-CM

## 2013-05-01 DIAGNOSIS — M791 Myalgia, unspecified site: Secondary | ICD-10-CM

## 2013-05-01 DIAGNOSIS — F3289 Other specified depressive episodes: Secondary | ICD-10-CM

## 2013-05-01 DIAGNOSIS — F32A Depression, unspecified: Secondary | ICD-10-CM

## 2013-05-01 DIAGNOSIS — F329 Major depressive disorder, single episode, unspecified: Secondary | ICD-10-CM

## 2013-05-01 LAB — COMPREHENSIVE METABOLIC PANEL
ALBUMIN: 4 g/dL (ref 3.5–5.2)
ALK PHOS: 59 U/L (ref 39–117)
ALT: 30 U/L (ref 0–35)
AST: 20 U/L (ref 0–37)
BILIRUBIN TOTAL: 0.3 mg/dL (ref 0.2–1.2)
BUN: 10 mg/dL (ref 6–23)
CO2: 26 mEq/L (ref 19–32)
CREATININE: 0.79 mg/dL (ref 0.50–1.10)
Calcium: 9 mg/dL (ref 8.4–10.5)
Chloride: 105 mEq/L (ref 96–112)
GLUCOSE: 80 mg/dL (ref 70–99)
POTASSIUM: 4 meq/L (ref 3.5–5.3)
Sodium: 139 mEq/L (ref 135–145)
Total Protein: 7.3 g/dL (ref 6.0–8.3)

## 2013-05-01 LAB — LIPID PANEL
CHOL/HDL RATIO: 4.4 ratio
CHOLESTEROL: 173 mg/dL (ref 0–200)
HDL: 39 mg/dL — AB (ref >39)
LDL Cholesterol: 111 mg/dL — ABNORMAL HIGH (ref 0–99)
TRIGLYCERIDES: 117 mg/dL (ref <150)
VLDL: 23 mg/dL (ref 0–40)

## 2013-05-01 LAB — CBC
HEMATOCRIT: 36.7 % (ref 36.0–46.0)
HEMOGLOBIN: 12.2 g/dL (ref 12.0–15.0)
MCH: 29.6 pg (ref 26.0–34.0)
MCHC: 33.2 g/dL (ref 30.0–36.0)
MCV: 89.1 fL (ref 78.0–100.0)
Platelets: 240 10*3/uL (ref 150–400)
RBC: 4.12 MIL/uL (ref 3.87–5.11)
RDW: 14 % (ref 11.5–15.5)
WBC: 7.1 10*3/uL (ref 4.0–10.5)

## 2013-05-01 LAB — TSH: TSH: 1.635 u[IU]/mL (ref 0.350–4.500)

## 2013-05-01 LAB — CK: Total CK: 332 U/L — ABNORMAL HIGH (ref 7–177)

## 2013-05-01 MED ORDER — LISINOPRIL-HYDROCHLOROTHIAZIDE 10-12.5 MG PO TABS: 1.0000 | ORAL_TABLET | Freq: Every day | ORAL | Status: DC

## 2013-05-01 MED ORDER — SERTRALINE HCL 50 MG PO TABS: 50.0000 mg | ORAL_TABLET | Freq: Every day | ORAL | Status: DC

## 2013-05-01 NOTE — Patient Instructions (Signed)
It was great to see you again today!  For blood pressure: we are restarting the lisinopril-HCTZ.   For mood: I sent in a medicine for depression, called zoloft. Take it every day.   I will call you if your test results are not normal.  Otherwise, I will send you a letter.  If you do not hear from me with in 2 weeks please call our office.      Follow up in one week to see how you are feeling with the zoloft and the blood pressure medicine. We need to recheck your bloodwork at that visit.  Be well, Dr. Ardelia Mems

## 2013-05-01 NOTE — Progress Notes (Addendum)
Patient ID: Anna Anderson, female   DOB: [birth year], 33 y.o.   MRN: [id number]  HPI:  HTN: Has not been seen since September. Previously took lisinopril 10-12.5mg  but stopped this about one month ago. She has had recurrent muscle aches which she attributes to her potassium being too low with the medicine. The muscle aches improved for a while after stopping the medicine but have resumed. She endorses occasional headache and blurry vision. Works at [employer] for work. Sometimes her chest hurts there. It comes and goes and is just "on occasion". Has not hurt for 2-3 weeks and does not have any pain right now. Denies associated shortness of breath, sweating, nausea, or radiation to her neck or arm. She does have a family hx of CAD (mom diagnosed at age 4, has stent).   Well woman hx: last pap smear was 2 years ago. She has never had an abnormal pap before. Has an appt with her OB/GYN doctor for a repeat pap later this month. Has had some lower pelvic pain and is planning to address this with her OB/GYN doctor at that visit. LMP was on the 18th of January. Has hx of PCOS and has had a tubal ligation in the past. Sexually active with one female partner in the last year, no concern for STD's. No vaginal discharge. Declines flu shot. Does not drink, smoke, or do drugs. She really wants to lose weight and wants to go to the bariatric clinic to get weight loss medication.  Mood: states she is depressed "a lot". Gets stressed out about daily life stuff, like her car breaking down and having to get the kids to school. States she gets bent out of shape easily over things. Sometimes the depressed mood keeps her from doing the stuff she wants to do. No hx of treatment for any mental health problems. No family hx of mental health problems. Denies SI/HI. Has irregular sleep. No feelings of guilt. Somewhat decreased interest in activities. Does have decreased sex drive. Appetite is good. Denies any hx of missed sleep for  several days or periods of feeling invincible. Would like to start a medication for her mood.  ROS: See HPI  [name]: PCOS, HTN, obesity  PHYSICAL EXAM: BP 178/102  Pulse 83  Temp(Src) 98.1 F (36.7 C) (Oral)  Resp 18  Ht 5' 2.4" (1.585 m)  Wt 226 lb (102.513 kg)  BMI 40.81 kg/m2  SpO2 100%  LMP 04/16/2013 Gen: NAD, pleasant, cooperative HEENT: NCAT, no thyroid masses, TM's clear bilaterally, MMM. Some coarse facial hair is present, along with acanthosis on posterior neck. Heart: RRR, no murmurs Lungs: CTAB, NWOB Abdomen: soft, nontender to palpation, no masses or organomegaly Neuro: grossly nonfocal, speech intact. PERRL. Visualized portions of retina appear normal on funduscopic exam.  Ext: Legs nontender to palpation.  ASSESSMENT/PLAN:  # Health maintenance:  -Offered flu shot to patient today, but pt declined.  -Getting pap at [facility] office later this month -Will check lipids to screen for hyperlipidemia  See problem based charting for additional assessment/plan.  FOLLOW UP: F/u in one week for HTN and depression.  Note: Pt has a history of not following up as directed. I strongly stressed to her the importance of following up regularly for her BP and other medical problems.  [place]. Ardelia Mems, [place]

## 2013-05-01 NOTE — Assessment & Plan Note (Signed)
Newly diagnosed this visit. No hx concerning for bipolar spectrum illness. Will start zoloft 50mg  daily and have pt f/u in 1 week to assess tolerability and safety.

## 2013-05-01 NOTE — Assessment & Plan Note (Signed)
Etiology unclear. Unlikely to be medication related given that it has persisted despite stopping meds. Pt never followed up for labwork including CK. Will check this, along with other labs today.

## 2013-05-01 NOTE — Assessment & Plan Note (Addendum)
BP elevated today, due to stopping medication from perceived muscle cramping. Check labs (CMET, CBC, TSH, CK, lipids) today. Will restart lisinopril-HCTZ today and have pt return in 1 week for recheck of BMET and BP.   Occasional chest pain reported by pt. Description is not typical for angina, and I doubt she has CAD given her young age. Not having pain today. Advised that if this pain continues or persists then we need to see her back to discuss further.

## 2013-05-10 ENCOUNTER — Telehealth: Payer: Self-pay | Admitting: Family Medicine

## 2013-05-10 NOTE — Telephone Encounter (Signed)
[place] red team, please call patient and ask her to schedule a follow up appointment with me. I saw her on February 2 and asked her to come back in one week, but she has not scheduled an appointment.  One of her lab tests (CK, which looks at muscle inflammation) was elevated and we need to discuss this in person, do more lab testing, and follow up on her blood pressure and depression.  Thanks! Leeanne Rio, MD

## 2013-05-10 NOTE — Telephone Encounter (Signed)
Pt notified and will schedule appt.  Maitri Schnoebelen, [name], [place]

## 2013-05-11 NOTE — Telephone Encounter (Signed)
Patient calls back. Was told to schedule w/ Anna Anderson to discuss labs but MD not in clinic for 2 weeks. Patient very worried about labs. Please call patient.

## 2013-05-11 NOTE — Telephone Encounter (Signed)
Dr. Ardelia Mems, would you mind calling patient?  I talked to her yesterday.  Anna Anderson, [name], [place]

## 2013-05-12 NOTE — Telephone Encounter (Signed)
I would be happy to call her but I am on vacation and not at work.  The elevated muscle enzyme is not an emergency but it is something for which we need to do more testing to figure out what is causing it to be elevated, and her muscles to be sore. If pt would like to come in and see another provider sooner than she can see me that is fine, she can just schedule an appointment with another doctor.  Red team, do you mind calling her to let her know this? Thanks! Leeanne Rio, MD

## 2013-05-17 NOTE — Telephone Encounter (Signed)
Attempted call.  No answer.  Will try again later.  Anna Anderson, Anna Anderson, Anna Anderson

## 2013-05-18 NOTE — Telephone Encounter (Signed)
Pt has appt 05/29/13 to discuss labs. Anna Anderson, Anna Anderson

## 2013-05-29 ENCOUNTER — Ambulatory Visit (INDEPENDENT_AMBULATORY_CARE_PROVIDER_SITE_OTHER): Payer: Medicaid Other | Admitting: Family Medicine

## 2013-05-29 ENCOUNTER — Encounter: Payer: Self-pay | Admitting: Family Medicine

## 2013-05-29 VITALS — BP 124/84 | HR 92 | Temp 98.1°F | Ht 63.5 in | Wt 219.0 lb

## 2013-05-29 DIAGNOSIS — F3289 Other specified depressive episodes: Secondary | ICD-10-CM

## 2013-05-29 DIAGNOSIS — F32A Depression, unspecified: Secondary | ICD-10-CM

## 2013-05-29 DIAGNOSIS — I10 Essential (primary) hypertension: Secondary | ICD-10-CM

## 2013-05-29 DIAGNOSIS — M791 Myalgia, unspecified site: Secondary | ICD-10-CM

## 2013-05-29 DIAGNOSIS — R748 Abnormal levels of other serum enzymes: Secondary | ICD-10-CM

## 2013-05-29 DIAGNOSIS — F329 Major depressive disorder, single episode, unspecified: Secondary | ICD-10-CM

## 2013-05-29 DIAGNOSIS — IMO0001 Reserved for inherently not codable concepts without codable children: Secondary | ICD-10-CM

## 2013-05-29 LAB — POCT SEDIMENTATION RATE: POCT SED RATE: 28 mm/hr — AB (ref 0–22)

## 2013-05-29 LAB — C-REACTIVE PROTEIN

## 2013-05-29 MED ORDER — LISINOPRIL-HYDROCHLOROTHIAZIDE 10-12.5 MG PO TABS: 1.0000 | ORAL_TABLET | Freq: Every day | ORAL | Status: DC

## 2013-05-29 NOTE — Assessment & Plan Note (Signed)
BP now at goal. Continue current regimen. Check BMET today. Advised pt return if SOB becomes more frequent or more of a problem, does not seem to be hindering her function at this time.

## 2013-05-29 NOTE — Assessment & Plan Note (Signed)
Stable without medication. Continue to monitor.

## 2013-05-29 NOTE — Assessment & Plan Note (Signed)
CK elevated at last visit. Ddx broad. Will check sed rate, CRP, ANA. Pt will return to collect UA as she is unable to void at this time. F/u in 1 month.

## 2013-05-29 NOTE — Patient Instructions (Signed)
It was great to see you again today!  We are checking more bloodwork today. I will call you if your test results are not normal.  Otherwise, I will send you a letter.  If you do not hear from me with in 2 weeks please call our office.      If the shortness of breath worsens or becomes a problem please come back so we can talk about it.  Return at some point to give [place] a urine sample. Call the day before and let us know you'll be coming.  Schedule an appointment to see me back in 1 month.  Be well, Dr. Ardelia Mems

## 2013-05-29 NOTE — Progress Notes (Signed)
Patient ID: Anna Anderson, female   DOB: 04/11/1980, 33 y.o.   MRN: [id number]  HPI:  Muscle pain: Legs get tired even with minimal activity. Doesn't have that happen in her arms at all. Recently had elevated CK test and is here today to discuss labs.  HTN: checks on occasion at home, feeling better now that she's on the medicine. Gets <140/90 when checks at home. One time a few days ago had some fluttering of her chest, thought it was from the phentermine which she recently started from the bariatric clinic. It stopped when she decreased the dose of the phentermine. She gets chest fluttering about 1-2 times per week which lasts a couple seconds at most. Also on ROS endorses occasional SOB on exertion about 2-3 times per week, but this doesn't really bother her. No significant LE swelling. She is not short of breath right now. She recently started walking for exercise and is able to do this without significant shortness of breath.  Depression: never started the zoloft, decided she didn't want to take it. Thinks her mood is doing well. She has been worried about her elevated muscle test. Denies SI/HI.  ROS: See HPI  [place]: working on diet, seeing bariatric clinic, just started exercising, walks on treadmill 30 minutes at a time.  PHYSICAL EXAM: BP 124/84  Pulse 92  Temp(Src) 98.1 F (36.7 C) (Oral)  Ht 5' 3.5" (1.613 m)  Wt 219 lb (99.338 kg)  BMI 38.18 kg/m2  LMP 05/17/2013 Gen: NAD HEENT: NCAT Heart: RRR, no murmurs Lungs: CTAB, NWOB Abdomen: soft, nontender to palpation Neuro: grossly nonfocal, speech intact Ext: No appreciable lower extremity edema bilaterally. Leg musculature nontender to palpation. Normal strength. Psych: normal range of affect, well groomed, speech normal in rate and volume, normal eye contact  ASSESSMENT/PLAN:  See problem based charting for additional assessment/plan.  FOLLOW UP: F/u in 1 month for myalgias/CK elevation.  [place]. Ardelia Mems, [place]

## 2013-05-30 LAB — ANA: ANA: NEGATIVE

## 2013-05-31 ENCOUNTER — Telehealth: Payer: Self-pay | Admitting: Family Medicine

## 2013-05-31 NOTE — Telephone Encounter (Signed)
Will fwd to MD for review.  Sabrena Gavitt L, CMA  

## 2013-05-31 NOTE — Telephone Encounter (Signed)
Pt called and would like someone to call her with her lab results. jw

## 2013-06-01 ENCOUNTER — Encounter: Payer: Self-pay | Admitting: Family Medicine

## 2013-06-01 NOTE — Telephone Encounter (Signed)
Spoke with patient and informed her of below 

## 2013-06-01 NOTE — Telephone Encounter (Signed)
All of her lab results were within the normal range. I recommend she continue to exercise, which will likely help her muscle aching over the long term. We will need to recheck her muscle enzyme level in 6 months.  Please call pt and inform her of the above message. Leeanne Rio, MD

## 2013-06-28 ENCOUNTER — Ambulatory Visit: Payer: Medicaid Other | Admitting: Family Medicine

## 2013-11-11 ENCOUNTER — Emergency Department (HOSPITAL_COMMUNITY)
Admission: EM | Admit: 2013-11-11 | Discharge: 2013-11-11 | Discharge status: Against Medical Advice | Payer: Medicaid Other | Attending: Emergency Medicine | Admitting: Emergency Medicine

## 2013-11-11 ENCOUNTER — Encounter (HOSPITAL_COMMUNITY): Payer: Self-pay | Admitting: Emergency Medicine

## 2013-11-11 DIAGNOSIS — I10 Essential (primary) hypertension: Secondary | ICD-10-CM | POA: Diagnosis not present

## 2013-11-11 DIAGNOSIS — R0602 Shortness of breath: Secondary | ICD-10-CM | POA: Diagnosis not present

## 2013-11-11 NOTE — ED Notes (Signed)
Patient left AMA.

## 2013-11-11 NOTE — ED Notes (Signed)
RN into room to collect blood. Patient not in room.

## 2013-11-11 NOTE — ED Notes (Signed)
The pt reports that she has been feeling lightheaded for 3 days and at  Times she feels like her heart is beating fast.  lmp July 28th

## 2013-11-11 NOTE — ED Notes (Signed)
Patient still not in room. MD notified.

## 2013-11-11 NOTE — ED Notes (Signed)
Patient still not returned to Room.

## 2014-04-02 ENCOUNTER — Other Ambulatory Visit: Payer: Self-pay | Admitting: *Deleted

## 2014-04-03 MED ORDER — LISINOPRIL-HYDROCHLOROTHIAZIDE 10-12.5 MG PO TABS: 1.0000 | ORAL_TABLET | Freq: Every day | ORAL | Status: DC

## 2014-06-06 ENCOUNTER — Ambulatory Visit (INDEPENDENT_AMBULATORY_CARE_PROVIDER_SITE_OTHER): Payer: Medicaid Other | Admitting: Family Medicine

## 2014-06-06 ENCOUNTER — Encounter: Payer: Self-pay | Admitting: Family Medicine

## 2014-06-06 VITALS — BP 126/81 | HR 75 | Temp 98.2°F | Ht 64.0 in | Wt 231.0 lb

## 2014-06-06 DIAGNOSIS — R5382 Chronic fatigue, unspecified: Secondary | ICD-10-CM

## 2014-06-06 DIAGNOSIS — Z0000 Encounter for general adult medical examination without abnormal findings: Secondary | ICD-10-CM

## 2014-06-06 DIAGNOSIS — R0789 Other chest pain: Secondary | ICD-10-CM

## 2014-06-06 DIAGNOSIS — E669 Obesity, unspecified: Secondary | ICD-10-CM

## 2014-06-06 DIAGNOSIS — M791 Myalgia, unspecified site: Secondary | ICD-10-CM

## 2014-06-06 DIAGNOSIS — R5383 Other fatigue: Secondary | ICD-10-CM | POA: Insufficient documentation

## 2014-06-06 DIAGNOSIS — Z13220 Encounter for screening for lipoid disorders: Secondary | ICD-10-CM

## 2014-06-06 DIAGNOSIS — I10 Essential (primary) hypertension: Secondary | ICD-10-CM

## 2014-06-06 DIAGNOSIS — R748 Abnormal levels of other serum enzymes: Secondary | ICD-10-CM

## 2014-06-06 LAB — COMPREHENSIVE METABOLIC PANEL
ALBUMIN: 3.9 g/dL (ref 3.5–5.2)
ALT: 37 U/L — ABNORMAL HIGH (ref 0–35)
AST: 24 U/L (ref 0–37)
Alkaline Phosphatase: 44 U/L (ref 39–117)
BUN: 14 mg/dL (ref 6–23)
CO2: 26 meq/L (ref 19–32)
Calcium: 9.2 mg/dL (ref 8.4–10.5)
Chloride: 103 mEq/L (ref 96–112)
Creat: 0.77 mg/dL (ref 0.50–1.10)
Glucose, Bld: 74 mg/dL (ref 70–99)
POTASSIUM: 4.4 meq/L (ref 3.5–5.3)
SODIUM: 138 meq/L (ref 135–145)
TOTAL PROTEIN: 7.4 g/dL (ref 6.0–8.3)
Total Bilirubin: 0.3 mg/dL (ref 0.2–1.2)

## 2014-06-06 LAB — LIPID PANEL
CHOL/HDL RATIO: 6.1 ratio
Cholesterol: 184 mg/dL (ref 0–200)
HDL: 30 mg/dL — ABNORMAL LOW (ref >=46)
LDL Cholesterol: 128 mg/dL — ABNORMAL HIGH (ref 0–99)
Triglycerides: 132 mg/dL (ref <150)
VLDL: 26 mg/dL (ref 0–40)

## 2014-06-06 LAB — CK: Total CK: 369 U/L — ABNORMAL HIGH (ref 7–177)

## 2014-06-06 LAB — TSH: TSH: 2.548 u[IU]/mL (ref 0.350–4.500)

## 2014-06-06 MED ORDER — LISINOPRIL-HYDROCHLOROTHIAZIDE 10-12.5 MG PO TABS: 1.0000 | ORAL_TABLET | Freq: Every day | ORAL | Status: DC

## 2014-06-06 MED ORDER — RANITIDINE HCL 150 MG PO TABS: 150.0000 mg | ORAL_TABLET | Freq: Two times a day (BID) | ORAL | Status: DC

## 2014-06-06 NOTE — Assessment & Plan Note (Signed)
We'll start initial workup with CMET, TSH, vitamin D along with other labs. If these labs are normal, she would likely benefit from a sleep study.

## 2014-06-06 NOTE — Assessment & Plan Note (Signed)
Will repeat CK testing today to trend.

## 2014-06-06 NOTE — Progress Notes (Signed)
Patient ID: Anna Anderson, female   DOB: 01/03/1981, 34 y.o.   MRN: [id number] HPI:  Patient presents today for a well woman exam.  Feels low energy Mood has been okay  Concerns today: low energy, needs refill on BP meds, obesity Periods: skipped this month. Hx of PCOS Contraception: tubal ligation Pelvic symptoms: no pelvic pain or vaginal discharge Sexual activity: one female partner in last year STD Screening: not concerned for STD's, declines testing Pap smear status: Getting pap next month at [facility] office.  Exercise: not much exercises Diet: doesn't eat healthy Smoking: no Alcohol: no, occasional Drugs: no Advance directives: would want to be full code  Obesity - Previously saw weight loss bariatric clinic and was put on phentermine. She would like to go back there.  HTN well controlled. Takes lisin-HCTZ daily. Gets occasional palpitations lasting a few hrs at a time. Went to ER and was told her EKG was fine when this happened. Also endorses chest pain occuring about 3x/month for 5-10 mins at a time. Associated with mild SOB. Feels like gas in her chest. Does not radiate anywhere. No accompanying nausea or sweating. Unrelated to exertion, occurs both when walking and when still. No hx of acid reflux before.  Feels very fatigued, with low energy. Thinks she sleeps well. Mood is good  ROS: See HPI  [place]:  Cancers in family: uncle with colon cancer at age 15  PHYSICAL EXAM: BP 126/81 mmHg  Pulse 75  Temp(Src) 98.2 F (36.8 C) (Oral)  Ht 5\' 4"  (1.626 m)  Wt 231 lb (104.781 kg)  BMI 39.63 kg/m2  LMP 04/07/2014 Gen: NAD, pleasant, cooperative. obese HEENT: NCAT, PERRL, MMM, no palpable thyromegaly or anterior cervical lymphadenopathy Heart: RRR, no murmurs Lungs: CTAB, NWOB Abdomen: soft, nontender to palpation Neuro: grossly nonfocal, speech normal Ext: No appreciable lower extremity edema bilaterally   ASSESSMENT/PLAN:  # Health maintenance:  -STD screening: declines  today -pap smear: getting next month -mammogram: not indicated -lipid screening: check lipids today -advance directives: full code -handout given on health maintenance topics  Myalgia Will repeat CK testing today to trend.   Fatigue We'll start initial workup with CMET, TSH, vitamin D along with other labs. If these labs are normal, she would likely benefit from a sleep study.   HYPERTENSION, BENIGN SYSTEMIC Well-controlled, continue current regimen. Check renal function and electrolytes today.   Obesity (BMI 30-39.9) Patient states she would like to return to the weight loss clinic. I informed her she can call and schedule an appointment on her own.   Chest discomfort Very atypical in description. Low suspicion for cardiac etiology. We'll start ranitidine 150 mg twice a day for possible acid reflux. Follow-up in one month to evaluate for improvement.      FOLLOW UP: F/u in 1 month for chest discomfort  [place] J. Ardelia Mems, [place]

## 2014-06-06 NOTE — Patient Instructions (Addendum)
Checking bloodwork We will call or send a letter with results  Call the bariatric clinic if you want to go there again  I refilled your BP med  Start ranitidine $RemoveBeforeDE'150mg'[id number]$  twice a day for acid reflux Follow up in 1 mo to see how this is doing.  Be well, Dr. Ardelia Mems    Health Maintenance Adopting a healthy lifestyle and getting preventive care can go a long way to promote health and wellness. Talk with your health care provider about what schedule of regular examinations is right for you. This is a good chance for you to check in with your provider about disease prevention and staying healthy. In between checkups, there are plenty of things you can do on your own. Experts have done a lot of research about which lifestyle changes and preventive measures are most likely to keep you healthy. Ask your health care provider for more information. WEIGHT AND DIET  Eat a healthy diet  Be sure to include plenty of vegetables, fruits, low-fat dairy products, and lean protein.  Do not eat a lot of foods high in solid fats, added sugars, or salt.  Get regular exercise. This is one of the most important things you can do for your health.  Most adults should exercise for at least 150 minutes each week. The exercise should increase your heart rate and make you sweat (moderate-intensity exercise).  Most adults should also do strengthening exercises at least twice a week. This is in addition to the moderate-intensity exercise.  Maintain a healthy weight  Body mass index (BMI) is a measurement that can be used to identify possible weight problems. It estimates body fat based on height and weight. Your health care provider can help determine your BMI and help you achieve or maintain a healthy weight.  For females 71 years of age and older:   A BMI below 18.5 is considered underweight.  A BMI of 18.5 to 24.9 is normal.  A BMI of 25 to 29.9 is considered overweight.  A BMI of 30 and above is  considered obese.  Watch levels of cholesterol and blood lipids  You should start having your blood tested for lipids and cholesterol at 34 years of age, then have this test every 5 years.  You may need to have your cholesterol levels checked more often if:  Your lipid or cholesterol levels are high.  You are older than 34 years of age.  You are at high risk for heart disease.  CANCER SCREENING   Lung Cancer  Lung cancer screening is recommended for adults 15-21 years old who are at high risk for lung cancer because of a history of smoking.  A yearly low-dose CT scan of the lungs is recommended for people who:  Currently smoke.  Have quit within the past 15 years.  Have at least a 30-pack-year history of smoking. A pack year is smoking an average of one pack of cigarettes a day for 1 year.  Yearly screening should continue until it has been 15 years since you quit.  Yearly screening should stop if you develop a health problem that would prevent you from having lung cancer treatment.  Breast Cancer  Practice breast self-awareness. This means understanding how your breasts normally appear and feel.  It also means doing regular breast self-exams. Let your health care provider know about any changes, no matter how small.  If you are in your 20s or 30s, you should have a clinical breast exam (CBE) by  a health care provider every 1-3 years as part of a regular health exam.  If you are 40 or older, have a CBE every year. Also consider having a breast X-ray (mammogram) every year.  If you have a family history of breast cancer, talk to your health care provider about genetic screening.  If you are at high risk for breast cancer, talk to your health care provider about having an MRI and a mammogram every year.  Breast cancer gene (BRCA) assessment is recommended for women who have family members with BRCA-related cancers. BRCA-related cancers  include:  Breast.  Ovarian.  Tubal.  Peritoneal cancers.  Results of the assessment will determine the need for genetic counseling and BRCA1 and BRCA2 testing. Cervical Cancer Routine pelvic examinations to screen for cervical cancer are no longer recommended for nonpregnant women who are considered low risk for cancer of the pelvic organs (ovaries, uterus, and vagina) and who do not have symptoms. A pelvic examination may be necessary if you have symptoms including those associated with pelvic infections. Ask your health care provider if a screening pelvic exam is right for you.   The Pap test is the screening test for cervical cancer for women who are considered at risk.  If you had a hysterectomy for a problem that was not cancer or a condition that could lead to cancer, then you no longer need Pap tests.  If you are older than 65 years, and you have had normal Pap tests for the past 10 years, you no longer need to have Pap tests.  If you have had past treatment for cervical cancer or a condition that could lead to cancer, you need Pap tests and screening for cancer for at least 20 years after your treatment.  If you no longer get a Pap test, assess your risk factors if they change (such as having a new sexual partner). This can affect whether you should start being screened again.  Some women have medical problems that increase their chance of getting cervical cancer. If this is the case for you, your health care provider may recommend more frequent screening and Pap tests.  The human papillomavirus (HPV) test is another test that may be used for cervical cancer screening. The HPV test looks for the virus that can cause cell changes in the cervix. The cells collected during the Pap test can be tested for HPV.  The HPV test can be used to screen women 30 years of age and older. Getting tested for HPV can extend the interval between normal Pap tests from three to five years.  An HPV  test also should be used to screen women of any age who have unclear Pap test results.  After 34 years of age, women should have HPV testing as often as Pap tests.  Colorectal Cancer  This type of cancer can be detected and often prevented.  Routine colorectal cancer screening usually begins at 34 years of age and continues through 34 years of age.  Your health care provider may recommend screening at an earlier age if you have risk factors for colon cancer.  Your health care provider may also recommend using home test kits to check for hidden blood in the stool.  A small camera at the end of a tube can be used to examine your colon directly (sigmoidoscopy or colonoscopy). This is done to check for the earliest forms of colorectal cancer.  Routine screening usually begins at age 50.  Direct examination   of the colon should be repeated every 5-10 years through 34 years of age. However, you may need to be screened more often if early forms of precancerous polyps or small growths are found. Skin Cancer  Check your skin from head to toe regularly.  Tell your health care provider about any new moles or changes in moles, especially if there is a change in a mole's shape or color.  Also tell your health care provider if you have a mole that is larger than the size of a pencil eraser.  Always use sunscreen. Apply sunscreen liberally and repeatedly throughout the day.  Protect yourself by wearing long sleeves, pants, a wide-brimmed hat, and sunglasses whenever you are outside. HEART DISEASE, DIABETES, AND HIGH BLOOD PRESSURE   Have your blood pressure checked at least every 1-2 years. High blood pressure causes heart disease and increases the risk of stroke.  If you are between 55 years and 79 years old, ask your health care provider if you should take aspirin to prevent strokes.  Have regular diabetes screenings. This involves taking a blood sample to check your fasting blood sugar  level.  If you are at a normal weight and have a low risk for diabetes, have this test once every three years after 34 years of age.  If you are overweight and have a high risk for diabetes, consider being tested at a younger age or more often. PREVENTING INFECTION  Hepatitis B  If you have a higher risk for hepatitis B, you should be screened for this virus. You are considered at high risk for hepatitis B if:  You were born in a country where hepatitis B is common. Ask your health care provider which countries are considered high risk.  Your parents were born in a high-risk country, and you have not been immunized against hepatitis B (hepatitis B vaccine).  You have HIV or AIDS.  You use needles to inject street drugs.  You live with someone who has hepatitis B.  You have had sex with someone who has hepatitis B.  You get hemodialysis treatment.  You take certain medicines for conditions, including cancer, organ transplantation, and autoimmune conditions. Hepatitis C  Blood testing is recommended for:  Everyone born from 1945 through 1965.  Anyone with known risk factors for hepatitis C. Sexually transmitted infections (STIs)  You should be screened for sexually transmitted infections (STIs) including gonorrhea and chlamydia if:  You are sexually active and are younger than 34 years of age.  You are older than 34 years of age and your health care provider tells you that you are at risk for this type of infection.  Your sexual activity has changed since you were last screened and you are at an increased risk for chlamydia or gonorrhea. Ask your health care provider if you are at risk.  If you do not have HIV, but are at risk, it may be recommended that you take a prescription medicine daily to prevent HIV infection. This is called pre-exposure prophylaxis (PrEP). You are considered at risk if:  You are sexually active and do not regularly use condoms or know the HIV status  of your partner(s).  You take drugs by injection.  You are sexually active with a partner who has HIV. Talk with your health care provider about whether you are at high risk of being infected with HIV. If you choose to begin PrEP, you should first be tested for HIV. You should then be tested every   3 months for as long as you are taking PrEP.  PREGNANCY   If you are premenopausal and you may become pregnant, ask your health care provider about preconception counseling.  If you may become pregnant, take 400 to 800 micrograms (mcg) of folic acid every day.  If you want to prevent pregnancy, talk to your health care provider about birth control (contraception). OSTEOPOROSIS AND MENOPAUSE   Osteoporosis is a disease in which the bones lose minerals and strength with aging. This can result in serious bone fractures. Your risk for osteoporosis can be identified using a bone density scan.  If you are 65 years of age or older, or if you are at risk for osteoporosis and fractures, ask your health care provider if you should be screened.  Ask your health care provider whether you should take a calcium or vitamin D supplement to lower your risk for osteoporosis.  Menopause may have certain physical symptoms and risks.  Hormone replacement therapy may reduce some of these symptoms and risks. Talk to your health care provider about whether hormone replacement therapy is right for you.  HOME CARE INSTRUCTIONS   Schedule regular health, dental, and eye exams.  Stay current with your immunizations.   Do not use any tobacco products including cigarettes, chewing tobacco, or electronic cigarettes.  If you are pregnant, do not drink alcohol.  If you are breastfeeding, limit how much and how often you drink alcohol.  Limit alcohol intake to no more than 1 drink per day for nonpregnant women. One drink equals 12 ounces of beer, 5 ounces of wine, or 1 ounces of hard liquor.  Do not use street  drugs.  Do not share needles.  Ask your health care provider for help if you need support or information about quitting drugs.  Tell your health care provider if you often feel depressed.  Tell your health care provider if you have ever been abused or do not feel safe at home. Document Released: 09/29/2010 Document Revised: 07/31/2013 Document Reviewed: 02/15/2013 Exit[employer] Patient Information 2015 [employer]. This information is not intended to replace advice given to you by your health care provider. Make sure you discuss any questions you have with your health care provider.  

## 2014-06-06 NOTE — Assessment & Plan Note (Signed)
Well-controlled, continue current regimen. Check renal function and electrolytes today.

## 2014-06-06 NOTE — Assessment & Plan Note (Signed)
Patient states she would like to return to the weight loss clinic. I informed her she can call and schedule an appointment on her own.

## 2014-06-06 NOTE — Assessment & Plan Note (Signed)
Very atypical in description. Low suspicion for cardiac etiology. We'll start ranitidine 150 mg twice a day for possible acid reflux. Follow-up in one month to evaluate for improvement.

## 2014-06-07 LAB — VITAMIN D 25 HYDROXY (VIT D DEFICIENCY, FRACTURES): Vit D, 25-Hydroxy: 10 ng/mL — ABNORMAL LOW (ref 30–100)

## 2014-06-07 NOTE — Progress Notes (Signed)
I was the preceptor on the day of this visit.   Johntay Doolen MD  

## 2014-06-21 ENCOUNTER — Telehealth: Payer: Self-pay | Admitting: Family Medicine

## 2014-06-21 MED ORDER — CHOLECALCIFEROL 1.25 MG (50000 UT) PO TABS: 1.0000 | ORAL_TABLET | ORAL | Status: DC

## 2014-06-21 NOTE — Telephone Encounter (Signed)
Please call pt and let her know that her vitamin D level was very low. I recommend she take vitamin D replacement pills. One pill weekly for 8 weeks. I sent in a prescription for her. After the 8 weeks, we'll need to do a daily vitamin D pill at a lower dose (I'll prescribe this at a future office visit).  She should follow up with me in 2 months at which time we'll recheck her lab tests.  Also, cholesterol was a little high and I recommend she work on exercise and healthy eating.  Leeanne Rio, MD

## 2014-06-25 NOTE — Telephone Encounter (Signed)
Patient informed, expressed understanding. 

## 2014-06-28 ENCOUNTER — Telehealth: Payer: Self-pay | Admitting: Family Medicine

## 2014-06-28 NOTE — Telephone Encounter (Signed)
Patient informed that labs were up front to be picked up.

## 2014-06-28 NOTE — Telephone Encounter (Signed)
Patient need copy of last lab results to take to her appt tomorrow with the Bariatric clinic

## 2014-07-06 ENCOUNTER — Ambulatory Visit: Payer: Medicaid Other | Admitting: Family Medicine

## 2014-07-18 ENCOUNTER — Encounter (HOSPITAL_COMMUNITY): Payer: Self-pay | Admitting: Emergency Medicine

## 2014-07-18 ENCOUNTER — Emergency Department (HOSPITAL_COMMUNITY): Payer: Medicaid Other

## 2014-07-18 ENCOUNTER — Emergency Department (HOSPITAL_COMMUNITY)
Admission: EM | Admit: 2014-07-18 | Discharge: 2014-07-18 | Discharge status: Against Medical Advice | Payer: Medicaid Other | Attending: Emergency Medicine | Admitting: Emergency Medicine

## 2014-07-18 ENCOUNTER — Telehealth: Payer: Self-pay | Admitting: *Deleted

## 2014-07-18 DIAGNOSIS — R079 Chest pain, unspecified: Secondary | ICD-10-CM | POA: Insufficient documentation

## 2014-07-18 LAB — BASIC METABOLIC PANEL
Anion gap: 9 (ref 5–15)
BUN: 8 mg/dL (ref 6–23)
CO2: 24 mmol/L (ref 19–32)
Calcium: 9 mg/dL (ref 8.4–10.5)
Chloride: 107 mmol/L (ref 96–112)
Creatinine, Ser: 0.91 mg/dL (ref 0.50–1.10)
GFR calc non Af Amer: 82 mL/min — ABNORMAL LOW (ref >90)
Glucose, Bld: 110 mg/dL — ABNORMAL HIGH (ref 70–99)
POTASSIUM: 3.6 mmol/L (ref 3.5–5.1)
Sodium: 140 mmol/L (ref 135–145)

## 2014-07-18 LAB — CBC
HEMATOCRIT: 35.7 % — AB (ref 36.0–46.0)
Hemoglobin: 11.6 g/dL — ABNORMAL LOW (ref 12.0–15.0)
MCH: 29.4 pg (ref 26.0–34.0)
MCHC: 32.5 g/dL (ref 30.0–36.0)
MCV: 90.4 fL (ref 78.0–100.0)
PLATELETS: 218 10*3/uL (ref 150–400)
RBC: 3.95 MIL/uL (ref 3.87–5.11)
RDW: 13 % (ref 11.5–15.5)
WBC: 6.5 10*3/uL (ref 4.0–10.5)

## 2014-07-18 LAB — I-STAT TROPONIN, ED: TROPONIN I, POC: 0 ng/mL (ref 0.00–0.08)

## 2014-07-18 NOTE — ED Notes (Signed)
Pt c/o heart palpitations and dizziness x 2 days. sts she has experience this before and nothing ever came of it. C.o sob and exhaustion.

## 2014-07-18 NOTE — Telephone Encounter (Signed)
Pt called to schedule an appt with PCP.  Pt complained of SOB, dizziness, increase heart rate and heart flutter x 2 days.  Pt also complained of fatigue.  Advised pt she should go to urgent care or ED and follow up with PCP.  Precept with Dr. Ardelia Mems; agree patient go to ED or urgent care today.  Pt stated understanding.  Derl Barrow, RN

## 2014-07-18 NOTE — ED Notes (Signed)
Pt called x4. No answer 

## 2014-07-23 ENCOUNTER — Telehealth: Payer: Self-pay | Admitting: Family Medicine

## 2014-07-23 NOTE — Telephone Encounter (Signed)
Pt informed. She also scheduled an appt for tomorrow. Graciemae Delisle Kennon Holter, CMA

## 2014-07-23 NOTE — Telephone Encounter (Signed)
Patient needs to be seen in the office. She was never seen by a provider in the ER when she went to the ER. Given the symptoms she has reported, we need to see her and evaluate her before making any medication adjustments. The person who sees her will also be able to review lab results with her. Red team, please inform patient.  Leeanne Rio, MD

## 2014-07-23 NOTE — Telephone Encounter (Signed)
Anna Anderson also need to know results of her labs from her ED visit on last Friday.

## 2014-07-23 NOTE — Telephone Encounter (Signed)
Would like lisionpril changed.  She developed a cough. One of the side effects is coughing Hasnt taken it in five days.  Head is hurting Please advise Not norvas--cant take that

## 2014-07-24 ENCOUNTER — Ambulatory Visit (INDEPENDENT_AMBULATORY_CARE_PROVIDER_SITE_OTHER): Payer: Medicaid Other | Admitting: Family Medicine

## 2014-07-24 ENCOUNTER — Encounter: Payer: Self-pay | Admitting: Family Medicine

## 2014-07-24 VITALS — BP 149/94 | HR 82 | Temp 99.2°F | Ht 64.0 in | Wt 237.8 lb

## 2014-07-24 DIAGNOSIS — E559 Vitamin D deficiency, unspecified: Secondary | ICD-10-CM

## 2014-07-24 DIAGNOSIS — I10 Essential (primary) hypertension: Secondary | ICD-10-CM

## 2014-07-24 DIAGNOSIS — R21 Rash and other nonspecific skin eruption: Secondary | ICD-10-CM

## 2014-07-24 DIAGNOSIS — R51 Headache: Secondary | ICD-10-CM

## 2014-07-24 DIAGNOSIS — R519 Headache, unspecified: Secondary | ICD-10-CM | POA: Insufficient documentation

## 2014-07-24 HISTORY — DX: Vitamin D deficiency, unspecified: E55.9

## 2014-07-24 MED ORDER — CLOTRIMAZOLE 1 % EX CREA: 1.0000 "application " | TOPICAL_CREAM | Freq: Two times a day (BID) | CUTANEOUS | Status: DC

## 2014-07-24 MED ORDER — LOSARTAN POTASSIUM-HCTZ 50-12.5 MG PO TABS: 1.0000 | ORAL_TABLET | Freq: Every day | ORAL | Status: DC

## 2014-07-24 NOTE — Assessment & Plan Note (Addendum)
Headaches possibly related to HTN. Cr recently checked and normal. No focal neurologic changes. BP elevated mildly in clinic, otherwise VSS. - change to losartan-hctz and asked pt to check BP at home. - 2 week nurse visit for BP f/u and BMET - Encouraged daily exercise - F/u 1 month.

## 2014-07-24 NOTE — Assessment & Plan Note (Signed)
Likely related to HTN with no focal neurologic changes.  - See A/P for HTN.

## 2014-07-24 NOTE — Assessment & Plan Note (Signed)
Vitamin D daily week for 8 weeks.  - Recommended completing 8 week course (1 more month) and following up with PCP for recheck in 1 month.

## 2014-07-24 NOTE — Assessment & Plan Note (Signed)
Rash unclear etiology by exam after using daughter's steroid cream 2 weeks, but hx of rash in intertriginous areas is consistent with intertrigo, likely fungal. No improvement with triamcinolone argues against eczema though pos FH. - Clotrimazole cream rx'ed. - F/u 2 weeks if no improvement

## 2014-07-24 NOTE — Patient Instructions (Addendum)
For your headache, I agree this is likely related to your BP. Take losartan-HCTZ daily and follow up in 2 weeks for NURSE visit for BP recheck and labwork. Stay active with daily exercise. Buy a BP cuff and bring recorded BP values a few times weekly and when you feel dizzy. Follow up with Dr Ardelia Mems in 1 month.  Take vitamin D weekly for another 4 weeks and at follow up in 1 month we will recheck labs.  For your rash, this may more likely be a small infection you can get in between skin folds. Keep this area clean and dry. Try the clotrimazole cream twice daily for 1-2  Weeks and follow up if not improving.  Avoid caffeine.  Best,  Hilton Sinclair, MD

## 2014-07-24 NOTE — Progress Notes (Signed)
Patient ID: MOLLIE ROSSANO, female   DOB: [birth year], 34 y.o.   MRN: [id number] Subjective:   CC: headaches  HPI:   Patient presents to same-day clinic for evaluation of headaches. She would also like to discuss rash and vitamin D.  Headaches Patient states that she has had headaches for about 5 days that have gotten so severe that they occasionally wake her from sleep. It is a throbbing dull constant frontal headache with intermittent worsening. Pain is currently 3 out of 10. She stopped lisinopril and phentermine 6 days ago. BC powder did not improve symptoms. Nothing makes it worse. Constant with occasional worsening. Denies focal weakness, difficulty with gait or speech, but does report some vertigo, mild blurred vision, muffled hearing, and flashing lights during headaches which have occurred for her in the past with high BP. See in ED a few days ago, no note, but BMET, troponin, CBC, EKG and CXR unremarkable.  Rash Reports rash on back, arms, and where thighs rub together. Present 3 weeks, minimal change over that time. Has used her daughter's triamcinolone with no improvement. FH of eczema. No fevers/chills, erythema, purulence, or pain. Very itchy.  Vitamin D Patient was rx'ed 8 week course of weekly vitamin D and has been taking this for 4 weeks. She wanted to know if she needs to continue remaining 4 weeks.     Review of Systems - Per HPI. Additionally, "chest flutter" with heart racing. No further episodes since ED. Intermittent. Atrtributes it to when BP high. Resolves spontaneously when asleep.  PMH - depression, fatigue, hirsutism, htn, myalgia, obesity, polycystic ovary    Objective:  Physical Exam BP 149/94 mmHg  Pulse 82  Temp(Src) 99.2 F (37.3 C) (Oral)  Ht 5\' 4"  (1.626 m)  Wt 237 lb 12.8 oz (107.865 kg)  BMI 40.80 kg/m2  LMP 06/16/2014 GEN: NAD SKIN: fold of skin on back, and skin in between thighs, with hyperpigmented hypertrophic rash with no erythema, warmth,  exudate, or induration. NEURO: Awake, alert, no focal deficit with CN 2-12 grossly intact, normal gait and speech CV: RRR, no m/r/g PULM: CTAB, normal effort  Assessment:     LEANN MAYWEATHER is a 34 y.o. female here for headache, also discussed rash and vitamin D.    Plan:     # See problem list and after visit summary for problem-specific plans.   # Health Maintenance: Not discussed  Follow-up: Follow up in 1 month with PCP for f/u vitamin D, headaches and BP.   Hilton Sinclair, MD [place]

## 2014-07-25 NOTE — Progress Notes (Signed)
I was the preceptor on the day of this visit.   Ricci Paff MD  

## 2014-10-29 ENCOUNTER — Telehealth: Payer: Self-pay | Admitting: Family Medicine

## 2014-10-29 NOTE — Telephone Encounter (Signed)
Pt called because the BP medication Hyzaar is causing her to gain weight and it is affecting her sex life. She would like to have something else prescribed. Please call patient to discuss. jw

## 2014-10-29 NOTE — Telephone Encounter (Signed)
Patient needs appointment to discuss these concerns. At last visit BP was elevated and she was supposed to return in 2 weeks. That was four months ago. Please inform patient.  Leeanne Rio, MD

## 2014-10-30 NOTE — Telephone Encounter (Signed)
Spoke with patient, she will check her scheduled at work and call back to set up an appointment.

## 2015-01-03 ENCOUNTER — Ambulatory Visit (INDEPENDENT_AMBULATORY_CARE_PROVIDER_SITE_OTHER): Payer: Medicaid Other | Admitting: Family Medicine

## 2015-01-03 DIAGNOSIS — R21 Rash and other nonspecific skin eruption: Secondary | ICD-10-CM

## 2015-01-03 MED ORDER — CETIRIZINE HCL 10 MG PO TABS: 10.0000 mg | ORAL_TABLET | Freq: Every day | ORAL | Status: DC

## 2015-01-03 MED ORDER — HYDROXYZINE HCL 25 MG PO TABS: 25.0000 mg | ORAL_TABLET | Freq: Four times a day (QID) | ORAL | Status: DC | PRN

## 2015-01-03 NOTE — Patient Instructions (Signed)
It was great seeing you today.   1. Take Zyrtec every night 2. Use hydroxyzine as needed for itching 3. Using continue to use Vaseline or eczema as well as steroids cream or anti-itch cream as needed 4. Restart your blood pressure medications  Next Appointment  Please make an appointment with Dr Fortunato Curling in 2 weeks   If you have any questions or concerns before then, please call the clinic at [phone].  Take Care,   Dr Phill Myron

## 2015-01-03 NOTE — Progress Notes (Signed)
   Subjective:    Patient ID: Anna Anderson, female    DOB: 1980/06/09, 34 y.o.   MRN: [id number]  Seen for Same day visit for   CC: Rash  RASH  - Rash x 1 week that is itchy but not painful.  No other people in the house have similar symptoms.  No new contacts or exposures or medications.  Rash is itchy throughout the day, not worse at night.  No rash on hands, feet, ankles, wrists.  No fevers, chills, or viral symptoms.  History of eczema.  She has stopped taking her blood pressure medication and vitamin B-12, but rash persist.   Had rash for 7 days. Location: arms, stomach, upper legs Medications tried: steroid cream Similar rash in past: no New medications or antibiotics: no Tick, Insect or new pet exposure: no Recent travel: no New detergent or soap: no Immunocompromised: no  Symptoms Itching: yes Pain over rash: no Feeling ill all over: no Fever: no Mouth sores: no Face or tongue swelling: no Trouble breathing: no Joint swelling or pain: no  Review of Symptoms - see HPI PMH - Smoking status noted.    Review of Systems   See HPI for ROS. Objective:  There were no vitals taken for this visit.  General: NAD Skin: small vesicles on bilateral forearms, thighs and stomach with secondary excoriations    Assessment & Plan:   Rash Small vesicular rash on bilateral arms, thighs and stomach without new exposures, medications or signs of infection.  Rash, possibly related to eczema versus viral etiology.  Doubt scabies or bedbugs as no other household contacts (that share same bed) have rash - Advised the Zyrtec nightly; hydroxyzine 25 mg every 6 hours when necessary - Recommended Vaseline for eczema - Anti-itch cream as needed - Follow-up with PCP in 2 weeks for rash and blood pressure

## 2015-01-03 NOTE — Assessment & Plan Note (Signed)
Small vesicular rash on bilateral arms, thighs and stomach without new exposures, medications or signs of infection.  Rash, possibly related to eczema versus viral etiology.  Doubt scabies or bedbugs as no other household contacts (that share same bed) have rash - Advised the Zyrtec nightly; hydroxyzine 25 mg every 6 hours when necessary - Recommended Vaseline for eczema - Anti-itch cream as needed - Follow-up with PCP in 2 weeks for rash and blood pressure

## 2015-03-21 LAB — HM PAP SMEAR

## 2015-04-09 ENCOUNTER — Encounter: Payer: Self-pay | Admitting: Family Medicine

## 2015-04-09 ENCOUNTER — Ambulatory Visit (INDEPENDENT_AMBULATORY_CARE_PROVIDER_SITE_OTHER): Payer: Medicaid Other | Admitting: Family Medicine

## 2015-04-09 VITALS — BP 134/108 | HR 78 | Temp 98.6°F | Ht 64.0 in | Wt 240.9 lb

## 2015-04-09 DIAGNOSIS — R21 Rash and other nonspecific skin eruption: Secondary | ICD-10-CM | POA: Diagnosis not present

## 2015-04-09 DIAGNOSIS — R002 Palpitations: Secondary | ICD-10-CM

## 2015-04-09 DIAGNOSIS — E669 Obesity, unspecified: Secondary | ICD-10-CM

## 2015-04-09 DIAGNOSIS — E559 Vitamin D deficiency, unspecified: Secondary | ICD-10-CM | POA: Diagnosis not present

## 2015-04-09 DIAGNOSIS — I10 Essential (primary) hypertension: Secondary | ICD-10-CM

## 2015-04-09 MED ORDER — HYDROCHLOROTHIAZIDE 25 MG PO TABS: 25.0000 mg | ORAL_TABLET | Freq: Every day | ORAL | Status: DC

## 2015-04-09 MED ORDER — AMLODIPINE BESYLATE 10 MG PO TABS: 10.0000 mg | ORAL_TABLET | Freq: Every day | ORAL | Status: DC

## 2015-04-09 NOTE — Patient Instructions (Signed)
Stop the hyzaar Start just HCTZ 25mg  daily Also amlodipine 10mg  daily  Use eucerin or aquaphor creams on rash  Return in 2 weeks for appointment to see how things are going and recheck BP May need to biopsy skin at that visit  labwork today: cholesterol, kidneys, liver, vitamin D  Be well, Dr. Ardelia Mems

## 2015-04-10 ENCOUNTER — Telehealth: Payer: Self-pay | Admitting: Family Medicine

## 2015-04-10 LAB — VITAMIN D 25 HYDROXY (VIT D DEFICIENCY, FRACTURES): VIT D 25 HYDROXY: 16 ng/mL — AB (ref 30–100)

## 2015-04-10 LAB — COMPREHENSIVE METABOLIC PANEL
ALT: 42 U/L — AB (ref 6–29)
AST: 25 U/L (ref 10–30)
Albumin: 3.9 g/dL (ref 3.6–5.1)
Alkaline Phosphatase: 58 U/L (ref 33–115)
BUN: 13 mg/dL (ref 7–25)
CALCIUM: 9.1 mg/dL (ref 8.6–10.2)
CHLORIDE: 107 mmol/L (ref 98–110)
CO2: 26 mmol/L (ref 20–31)
Creat: 0.76 mg/dL (ref 0.50–1.10)
Glucose, Bld: 82 mg/dL (ref 65–99)
POTASSIUM: 3.9 mmol/L (ref 3.5–5.3)
Sodium: 141 mmol/L (ref 135–146)
TOTAL PROTEIN: 7.2 g/dL (ref 6.1–8.1)
Total Bilirubin: 0.4 mg/dL (ref 0.2–1.2)

## 2015-04-12 DIAGNOSIS — R002 Palpitations: Secondary | ICD-10-CM | POA: Insufficient documentation

## 2015-04-12 NOTE — Assessment & Plan Note (Signed)
Well appearing with normal cardiac exam today. Patient perceives these are due to the hyzaar. Discussed options including EKG +/- holter monitor. Patient prefers to do trial off hyzaar and see how things go. She will follow up in a couple of weeks.

## 2015-04-12 NOTE — Progress Notes (Signed)
Date of Visit: 04/09/2015   HPI:  Patient presents to discuss rash.  Has had rash on body for 3 months. She perceives that this is due to the hyzaar. She went about a week without the medicine and the rash stopped being a problem. Once she resumed the hyzaar it started back up. No fevers. No sores in mouth or genitals. Applies triamcinolone compound to help with rash.  Also endorses intermittent palpitations, also thinks this is due to the hyzaar. palpitiations last5-10 mins at a time and occur once every few days. Has chest discomfort and shortness of breath while having palpitations, not otherwise. These stopped during the period of time when she wasn't on the hyzaar. Last took the medication 2 days ago.   Also reports ongoing fatigue.was dx'd with vit d deficiency last year and took vit d repletion but never followed up for repeat lab testing.  ROS: See HPI.  [place]: history of depression, pcos, obesity, vit d deficiency  PHYSICAL EXAM: BP 134/108 mmHg  Pulse 78  Temp(Src) 98.6 F (37 C) (Oral)  Ht 5\' 4"  (1.626 m)  Wt 240 lb 14.4 oz (109.272 kg)  BMI 41.33 kg/m2 Gen: NAD, pleasant, cooperative HEENT: normocephalic, atraumatic. No oral lesions, mouth moist Heart: regular rate and rhythm no murmur Lungs: clear to auscultation bilaterally normal work of breathing  Neuro: alert grossly nonfocal speech normal Ext: No appreciable lower extremity edema bilaterally  Skin: scattered areas of hyperpigmentation, appear to be areas of healing papules that were picked. No skin breakdown or weeping.  ASSESSMENT/PLAN:  Rash and nonspecific skin eruption Patient convinced this is due to hyzaar. Discussed options including punch biopsy, patient prefers to defer this today. Will stop the medicine and see how things go  With rash (note we are continuing hctz component). Recommend avoiding excessive use of topical steroid, instead do trial of eucerin or aquaphor. Follow up in a couple of weeks.    HYPERTENSION, BENIGN SYSTEMIC Stop hyzaar due to perceived etiology of rash Continue hctz Add amlodipine Labs today: cmet Follow up in a few weeks for blood pressure rechecl  Vitamin D deficiency Recheck vit d today, replete as needed  Palpitations Well appearing with normal cardiac exam today. Patient perceives these are due to the hyzaar. Discussed options including EKG +/- holter monitor. Patient prefers to do trial off hyzaar and see how things go. She will follow up in a couple of weeks.    Note - patient also reported that she is going to a weight loss clinic and is planning to start weight loss medication soon. Advised against doing this while we are working up the above issues. She stated her understanding of this advice.  FOLLOW UP: Follow up in a few weeks for above issues  [place] J. Ardelia Mems, [place]

## 2015-04-12 NOTE — Assessment & Plan Note (Signed)
Patient convinced this is due to hyzaar. Discussed options including punch biopsy, patient prefers to defer this today. Will stop the medicine and see how things go  With rash (note we are continuing hctz component). Recommend avoiding excessive use of topical steroid, instead do trial of eucerin or aquaphor. Follow up in a couple of weeks.

## 2015-04-12 NOTE — Assessment & Plan Note (Signed)
Recheck vit d today, replete as needed

## 2015-04-12 NOTE — Assessment & Plan Note (Signed)
Stop hyzaar due to perceived etiology of rash Continue hctz Add amlodipine Labs today: cmet Follow up in a few weeks for blood pressure rechecl

## 2015-04-17 ENCOUNTER — Telehealth: Payer: Self-pay | Admitting: Family Medicine

## 2015-04-17 MED ORDER — CHOLECALCIFEROL 1.25 MG (50000 UT) PO TABS: 1.0000 | ORAL_TABLET | ORAL | Status: DC

## 2015-04-17 NOTE — Telephone Encounter (Signed)
Attempted to reach patient to discuss lab results. No answer. Left vm asking her to call us back.  When she returns the call please let her know: -her vitamin D level was very low. I recommend she take vitamin D replacement pills. One pill weekly for 12 weeks. I sent in a prescription for her. After the 12 weeks, we'll need to recheck it.   Thanks, Leeanne Rio, MD

## 2015-04-17 NOTE — Telephone Encounter (Signed)
error 

## 2015-04-30 NOTE — Telephone Encounter (Signed)
Red team, can you try to call this patient? Anna Rio, MD

## 2015-05-02 NOTE — Telephone Encounter (Signed)
Pt informed. Phillis Thackeray, CMA  

## 2016-04-16 ENCOUNTER — Encounter (HOSPITAL_COMMUNITY): Payer: Self-pay

## 2016-04-16 ENCOUNTER — Emergency Department (HOSPITAL_COMMUNITY)
Admission: EM | Admit: 2016-04-16 | Discharge: 2016-04-16 | Disposition: A | Discharge status: Home / Self Care | Payer: Medicaid Other | Attending: Emergency Medicine | Admitting: Emergency Medicine

## 2016-04-16 ENCOUNTER — Emergency Department (HOSPITAL_COMMUNITY): Payer: Medicaid Other

## 2016-04-16 DIAGNOSIS — Z79899 Other long term (current) drug therapy: Secondary | ICD-10-CM | POA: Insufficient documentation

## 2016-04-16 DIAGNOSIS — R519 Headache, unspecified: Secondary | ICD-10-CM

## 2016-04-16 DIAGNOSIS — I10 Essential (primary) hypertension: Secondary | ICD-10-CM

## 2016-04-16 DIAGNOSIS — H53149 Visual discomfort, unspecified: Secondary | ICD-10-CM

## 2016-04-16 DIAGNOSIS — R51 Headache: Secondary | ICD-10-CM

## 2016-04-16 DIAGNOSIS — F40298 Other specified phobia: Secondary | ICD-10-CM

## 2016-04-16 LAB — CBC
HCT: 40.5 % (ref 36.0–46.0)
Hemoglobin: 13.1 g/dL (ref 12.0–15.0)
MCH: 29 pg (ref 26.0–34.0)
MCHC: 32.3 g/dL (ref 30.0–36.0)
MCV: 89.6 fL (ref 78.0–100.0)
Platelets: 283 10*3/uL (ref 150–400)
RBC: 4.52 MIL/uL (ref 3.87–5.11)
RDW: 13.7 % (ref 11.5–15.5)
WBC: 9.9 10*3/uL (ref 4.0–10.5)

## 2016-04-16 LAB — BASIC METABOLIC PANEL
ANION GAP: 11 (ref 5–15)
BUN: 10 mg/dL (ref 6–20)
CALCIUM: 9.6 mg/dL (ref 8.9–10.3)
CO2: 26 mmol/L (ref 22–32)
Chloride: 99 mmol/L — ABNORMAL LOW (ref 101–111)
Creatinine, Ser: 0.93 mg/dL (ref 0.44–1.00)
GFR calc Af Amer: >60 mL/min (ref >60)
GFR calc non Af Amer: >60 mL/min (ref >60)
Glucose, Bld: 97 mg/dL (ref 65–99)
POTASSIUM: 3.6 mmol/L (ref 3.5–5.1)
SODIUM: 136 mmol/L (ref 135–145)

## 2016-04-16 LAB — I-STAT TROPONIN, ED: TROPONIN I, POC: 0 ng/mL (ref 0.00–0.08)

## 2016-04-16 MED ORDER — DIPHENHYDRAMINE HCL 50 MG/ML IJ SOLN
25.0000 mg | Freq: Once | INTRAMUSCULAR | Status: AC
  Administered 2016-04-16: 25 mg via INTRAVENOUS
  Filled 2016-04-16: qty 1

## 2016-04-16 MED ORDER — DEXAMETHASONE SODIUM PHOSPHATE 10 MG/ML IJ SOLN
10.0000 mg | Freq: Once | INTRAMUSCULAR | Status: AC
  Administered 2016-04-16: 10 mg via INTRAVENOUS
  Filled 2016-04-16: qty 1

## 2016-04-16 MED ORDER — SODIUM CHLORIDE 0.9 % IV BOLUS (SEPSIS)
1000.0000 mL | Freq: Once | INTRAVENOUS | Status: AC
  Administered 2016-04-16: 1000 mL via INTRAVENOUS

## 2016-04-16 MED ORDER — PROCHLORPERAZINE EDISYLATE 5 MG/ML IJ SOLN
10.0000 mg | Freq: Once | INTRAMUSCULAR | Status: AC
  Administered 2016-04-16: 10 mg via INTRAVENOUS
  Filled 2016-04-16: qty 2

## 2016-04-16 MED ORDER — LABETALOL HCL 5 MG/ML IV SOLN
10.0000 mg | Freq: Once | INTRAVENOUS | Status: AC
  Administered 2016-04-16: 10 mg via INTRAVENOUS
  Filled 2016-04-16: qty 4

## 2016-04-16 NOTE — ED Provider Notes (Signed)
[facility] DEPT Provider Note  CSN: [id number] Arrival date & time: 04/16/16  [place]  History   Chief Complaint Chief Complaint  Patient presents with  . Chest Pain  . Hypertension   HPI Anna MARRISON is a 36 y.o. female.  The history is provided by the patient and medical records. No language interpreter was used.  Illness  This is a new problem. The current episode started more than 2 days ago. The problem occurs constantly. The problem has been gradually worsening. Associated symptoms include headaches and shortness of breath. Pertinent negatives include no chest pain and no abdominal pain. Exacerbated by: Lying down, walking. Nothing relieves the symptoms.    Past Medical History:  Diagnosis Date  . Hypertension    Patient Active Problem List   Diagnosis Date Noted  . Palpitations 04/12/2015  . Rash 01/03/2015  . Headache 07/24/2014  . Rash and nonspecific skin eruption 07/24/2014  . Vitamin D deficiency 07/24/2014  . Fatigue 06/06/2014  . Chest discomfort 06/06/2014  . Depression 05/01/2013  . Obesity (BMI 30-39.9) 12/15/2012  . Myalgia 12/15/2012  . 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 HYPERTENSION, BENIGN SYSTEMIC 05/27/2006  . MENSTRUATION, PAINFUL 05/27/2006  . HIRSUTISM 05/27/2006   Past Surgical History:  Procedure Laterality Date  . TUBAL LIGATION     OB History    No data available      Home Medications    Prior to Admission medications   Medication Sig Start Date End Date Taking? Authorizing Provider  acetaminophen (TYLENOL) 500 MG tablet Take 500 mg by mouth every 6 (six) hours as needed for headache.   Yes Historical Provider, MD  amLODipine (NORVASC) 10 MG tablet Take 1 tablet (10 mg total) by mouth daily. 04/09/15  Yes Leeanne Rio, MD  hydrochlorothiazide (HYDRODIURIL) 25 MG tablet Take 1 tablet (25 mg total) by mouth daily. 04/09/15  Yes Leeanne Rio, MD  ibuprofen (ADVIL,MOTRIN) 200 MG tablet Take 200 mg by mouth every 6 (six)  hours as needed for headache.   Yes Historical Provider, MD  Multiple Vitamin (MULTIVITAMIN WITH MINERALS) TABS tablet Take 1 tablet by mouth daily.   Yes Historical Provider, MD  cetirizine (ZYRTEC) 10 MG tablet Take 1 tablet (10 mg total) by mouth daily. Patient not taking: Reported on 04/16/2016 01/03/15   Olam Idler, MD  Cholecalciferol 50000 units TABS Take 1 tablet by mouth once a week. For 12 weeks Patient not taking: Reported on 04/16/2016 04/17/15   Leeanne Rio, MD  clotrimazole (LOTRIMIN) 1 % cream Apply 1 application topically 2 (two) times daily. Patient not taking: Reported on 04/16/2016 07/24/14   Hilton Sinclair, MD  hydrOXYzine (ATARAX/VISTARIL) 25 MG tablet Take 1 tablet (25 mg total) by mouth every 6 (six) hours as needed for itching. Patient not taking: Reported on 04/16/2016 01/03/15   Olam Idler, MD  ranitidine (ZANTAC) 150 MG tablet Take 1 tablet (150 mg total) by mouth 2 (two) times daily. Patient not taking: Reported on 04/16/2016 06/06/14   Leeanne Rio, MD   Family History History reviewed. No pertinent family history.  Social History Social History  Substance Use Topics  . Smoking status: Never Smoker  . Smokeless tobacco: Never Used  . Alcohol use No    Allergies   Amoxicillin and Lisinopril  Review of Systems Review of Systems  Eyes: Positive for photophobia. Negative for visual disturbance.  Respiratory: Positive for shortness of breath.   Cardiovascular: Positive for palpitations (strong heart beat). Negative  for chest pain and leg swelling.  Gastrointestinal: Positive for nausea. Negative for abdominal pain.  Neurological: Positive for headaches. Negative for dizziness, tremors, seizures, syncope, facial asymmetry, speech difficulty, weakness and numbness.  All other systems reviewed and are negative.   Physical Exam Updated Vital Signs BP 161/96   Pulse 83   Temp 97.7 F (36.5 C) (Oral)   Resp 23   LMP 03/25/2016 (Within  Days)   SpO2 99%   Physical Exam  Constitutional: She is oriented to person, place, and time. No distress.  Young obese Afro-American female  HENT:  Head: Normocephalic and atraumatic.  Eyes: EOM are normal. Pupils are equal, round, and reactive to light.  Neck: Normal range of motion. Neck supple.  Cardiovascular: Normal rate, regular rhythm and normal heart sounds.   Hypertensive  Pulmonary/Chest: Effort normal and breath sounds normal.  Abdominal: Soft. Bowel sounds are normal. She exhibits no distension. There is no tenderness.  Musculoskeletal: Normal range of motion.  Neurological: She is alert and oriented to person, place, and time.  Alert and oriented 4, cranial nerves II through XII intact, strength 5/5 throughout and symmetric, sensation intact, coordination intact, normal gait, fluent speech  Skin: Skin is warm and dry. Capillary refill takes less than 2 seconds. She is not diaphoretic.  Nursing note and vitals reviewed.   ED Treatments / Results  Labs (all labs ordered are listed, but only abnormal results are displayed) Labs Reviewed  BASIC METABOLIC PANEL - Abnormal; Notable for the following:       Result Value   Chloride 99 (*)    All other components within normal limits  CBC  I-STAT TROPOININ, ED   EKG  EKG Interpretation None      Radiology Dg Chest 2 View  Result Date: 04/16/2016 CLINICAL DATA:  36 year old female with elevated blood pressure. Headaches. Tightness in the chest. Tachycardia. EXAM: CHEST  2 VIEW COMPARISON:  Chest x-ray 07/18/2014. FINDINGS: Lung volumes are normal. No consolidative airspace disease. No pleural effusions. No pneumothorax. No evidence of pulmonary edema. Heart size is mildly enlarged. Upper mediastinal contours are within normal limits. IMPRESSION: 1. Mild cardiomegaly. 2. No radiographic evidence of acute cardiopulmonary disease. Electronically Signed   By: Vinnie Langton M.D.   On: 04/16/2016 19:11    Procedures Procedures (including critical care time)  Medications Ordered in ED Medications  prochlorperazine (COMPAZINE) injection 10 mg (10 mg Intravenous Given 04/16/16 2152)  dexamethasone (DECADRON) injection 10 mg (10 mg Intravenous Given 04/16/16 2151)  diphenhydrAMINE (BENADRYL) injection 25 mg (25 mg Intravenous Given 04/16/16 2151)  labetalol (NORMODYNE,TRANDATE) injection 10 mg (10 mg Intravenous Given 04/16/16 2152)  sodium chloride 0.9 % bolus 1,000 mL (0 mLs Intravenous Stopped 04/16/16 2306)    Initial Impression / Assessment and Plan / ED Course  I have reviewed the triage vital signs and the nursing notes.  36 y.o. female with above stated PMHx, HPI, and physical. PMHx of HTN (amlodipine & HCTZ), obesity, PCOS, & GERD. Long-standing history of hypertension diagnosed over 10 years ago. Patient previously on lisinopril but stopped secondary to cough. Patient been taking amlodipine and HCTZ with intermittent compliance for the past several years. Patient had previously come off secondary to exercise and diet control, and self discontinued them again 3 months ago because she had restarted exercise and diet. Patient has since then discontinued exercising and dieting. Symptom onset 1 week ago. Frontal throbbing headache, nausea, photophobia, phonophobia. History of similar headaches with uncontrolled blood pressure. Patient also notes  palpitations described as strong heartbeats as well as dyspnea on exertion/orthopnea. Denies vision changes, decreased UOP, CP, diaphoresis.  EKG showing no ST elevation/depression or T-wave inversions or interval upper bowel disease or dysrhythmias. HEAR score low risk. Delta troponin undetectable. BMP showing no evidence of end organ damage with normal creatinine. Chest x-ray showing no acute cardiopulmonary disease, specifically no evidence of interstitial edema. Patient given antihypertensive as well as migraine cocktail including Compazine, Benadryl,  and Decadron with IV fluids. HA completely resolved. BP improved.  Laboratory and imaging results were personally reviewed by myself and used in the medical decision making of this patient's treatment and disposition.  Pt already has supply of HCTZ & amlodipine at home and will continue to take as previously prescribed. Pt will call her PCP in the morning to establish an outpatient appointment. Pt discharged home in stable condition. Strict ED return precautions dicussed. Pt understands and agrees with the plan and has no further questions or concerns.   Pt care discussed with and followed by my attending, Dr. Dollene Cleveland, MD Pager [phone]  Final Clinical Impressions(s) / ED Diagnoses   Final diagnoses:  Essential hypertension  Throbbing headache  Photophobia  Phonophobia    New Prescriptions New Prescriptions   No medications on file     Mayer Camel, MD 04/16/16 2315    Gareth Morgan, MD 04/20/16 1002

## 2016-04-16 NOTE — ED Triage Notes (Signed)
Pt reports she has been out of htn meds X2 months. She reports intermittent headache. She reprots chest pan and shortness of breath began today. Describes as a pounding feeling. No distress noted.

## 2016-05-12 ENCOUNTER — Emergency Department (HOSPITAL_COMMUNITY)
Admission: EM | Admit: 2016-05-12 | Discharge: 2016-05-12 | Disposition: A | Discharge status: Home / Self Care | Payer: Medicaid Other | Attending: Emergency Medicine | Admitting: Emergency Medicine

## 2016-05-12 ENCOUNTER — Emergency Department (HOSPITAL_COMMUNITY): Payer: Medicaid Other

## 2016-05-12 ENCOUNTER — Encounter (HOSPITAL_COMMUNITY): Payer: Self-pay

## 2016-05-12 DIAGNOSIS — G44319 Acute post-traumatic headache, not intractable: Secondary | ICD-10-CM | POA: Insufficient documentation

## 2016-05-12 DIAGNOSIS — Y939 Activity, unspecified: Secondary | ICD-10-CM | POA: Insufficient documentation

## 2016-05-12 DIAGNOSIS — Y999 Unspecified external cause status: Secondary | ICD-10-CM | POA: Insufficient documentation

## 2016-05-12 DIAGNOSIS — I10 Essential (primary) hypertension: Secondary | ICD-10-CM | POA: Insufficient documentation

## 2016-05-12 DIAGNOSIS — Y929 Unspecified place or not applicable: Secondary | ICD-10-CM | POA: Insufficient documentation

## 2016-05-12 DIAGNOSIS — F0781 Postconcussional syndrome: Secondary | ICD-10-CM

## 2016-05-12 LAB — I-STAT BETA HCG BLOOD, ED (MC, WL, AP ONLY)

## 2016-05-12 MED ORDER — SODIUM CHLORIDE 0.9 % IV BOLUS (SEPSIS)
1000.0000 mL | Freq: Once | INTRAVENOUS | Status: AC
  Administered 2016-05-12: 1000 mL via INTRAVENOUS

## 2016-05-12 MED ORDER — ONDANSETRON 4 MG PO TBDP: 4.0000 mg | ORAL_TABLET | Freq: Three times a day (TID) | ORAL | 0 refills | Status: DC | PRN

## 2016-05-12 MED ORDER — MECLIZINE HCL 25 MG PO TABS: 25.0000 mg | ORAL_TABLET | Freq: Three times a day (TID) | ORAL | 0 refills | Status: DC | PRN

## 2016-05-12 MED ORDER — ONDANSETRON HCL 4 MG/2ML IJ SOLN
4.0000 mg | Freq: Once | INTRAMUSCULAR | Status: AC
  Administered 2016-05-12: 4 mg via INTRAVENOUS
  Filled 2016-05-12: qty 2

## 2016-05-12 MED ORDER — IBUPROFEN 800 MG PO TABS: 800.0000 mg | ORAL_TABLET | Freq: Three times a day (TID) | ORAL | 0 refills | Status: DC | PRN
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
## 2016-05-12 NOTE — ED Notes (Signed)
Delay in lab draw,  Pt not in room 

## 2016-05-12 NOTE — ED Notes (Signed)
Nurse will draw labs. 
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
## 2016-05-18 ENCOUNTER — Ambulatory Visit (INDEPENDENT_AMBULATORY_CARE_PROVIDER_SITE_OTHER): Payer: Medicaid Other | Admitting: Family Medicine

## 2016-05-18 ENCOUNTER — Encounter: Payer: Self-pay | Admitting: Family Medicine

## 2016-05-18 DIAGNOSIS — F0781 Postconcussional syndrome: Secondary | ICD-10-CM

## 2016-05-18 DIAGNOSIS — I10 Essential (primary) hypertension: Secondary | ICD-10-CM

## 2016-05-18 HISTORY — DX: Postconcussional syndrome: F07.81

## 2016-05-18 MED ORDER — AMLODIPINE BESYLATE 10 MG PO TABS: 10.0000 mg | ORAL_TABLET | Freq: Every day | ORAL | 1 refills | Status: DC

## 2016-05-18 MED ORDER — HYDROCHLOROTHIAZIDE 25 MG PO TABS: 25.0000 mg | ORAL_TABLET | Freq: Every day | ORAL | 1 refills | Status: DC
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
## 2016-05-18 NOTE — Patient Instructions (Signed)
It was a pleasure to see you today! Thank you for choosing [facility] for your primary care. Anna Anderson was seen for dizziness. Come back to the clinic if you don't see any improvement in dizzy spells over the next 2 weeks, and go to the emergency room if you feel like they are getting worse or you have a headache that won't go away. Please try to drink more water - about 3 20z bottles per day at least. You can also try "Seabands," which are bracelets you can buy at the drugstore that use accupressure to help with dizziness.  Best,  Dr. Lindell Noe

## 2016-05-18 NOTE — Assessment & Plan Note (Addendum)
My manual BP is 135/90. Took norvasc and HCTZ today. Patient reports she needs refills, will send those today.

## 2016-05-26 ENCOUNTER — Ambulatory Visit: Payer: Self-pay | Admitting: Neurology

## 2016-05-26 ENCOUNTER — Telehealth: Payer: Self-pay

## 2016-05-26 NOTE — Telephone Encounter (Signed)
Pt did not show for their appt with Dr. Athar today.  

## 2016-05-27 ENCOUNTER — Ambulatory Visit (HOSPITAL_COMMUNITY)
Admission: EM | Admit: 2016-05-27 | Discharge: 2016-05-27 | Disposition: A | Discharge status: Home / Self Care | Payer: Medicaid Other | Attending: Family Medicine | Admitting: Family Medicine

## 2016-05-27 ENCOUNTER — Encounter (HOSPITAL_COMMUNITY): Payer: Self-pay | Admitting: *Deleted

## 2016-05-27 DIAGNOSIS — R42 Dizziness and giddiness: Secondary | ICD-10-CM

## 2016-05-27 DIAGNOSIS — F0781 Postconcussional syndrome: Secondary | ICD-10-CM
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
## 2016-05-27 NOTE — ED Triage Notes (Addendum)
Pt  Reports       Dizzy      And       Shortness  Of  Breath  As   Well  As  Fatigue       And   Vertigo      X   2   Weeks   Seen  X  3  Recently  Had   Normal  Ct  Scan  Got  More  Dizzy  At  Work  Last  Pm   Pt  Did  Not  Take  Her bp  meds    Today    Because  She  Felt  bad

## 2016-05-27 NOTE — Discharge Instructions (Signed)
Please take your blood pressure medications when you get home, stay hydrated, and follow up with Dr. Ardelia Mems tomorrow.

## 2016-05-28 ENCOUNTER — Ambulatory Visit (INDEPENDENT_AMBULATORY_CARE_PROVIDER_SITE_OTHER): Payer: Self-pay | Admitting: Family Medicine

## 2016-05-28 VITALS — BP 132/70 | HR 89 | Temp 98.8°F | Wt 231.8 lb

## 2016-05-28 DIAGNOSIS — R5383 Other fatigue: Secondary | ICD-10-CM

## 2016-05-28 LAB — CBC
HEMATOCRIT: 41.9 % (ref 35.0–45.0)
HEMOGLOBIN: 13.7 g/dL (ref 11.7–15.5)
MCH: 29.1 pg (ref 27.0–33.0)
MCHC: 32.7 g/dL (ref 32.0–36.0)
MCV: 89 fL (ref 80.0–100.0)
MPV: 11.5 fL (ref 7.5–12.5)
Platelets: 288 10*3/uL (ref 140–400)
RBC: 4.71 MIL/uL (ref 3.80–5.10)
RDW: 14.4 % (ref 11.0–15.0)
WBC: 8.4 10*3/uL (ref 3.8–10.8)

## 2016-05-28 LAB — TSH: TSH: 2.28 mIU/L

## 2016-05-28 NOTE — Patient Instructions (Signed)
We will check blood work today. We should have the result early next week.  You may need a sleep study.  Come back to see your regular doctor soon.  Take care,  Dr Jerline Pain

## 2016-05-28 NOTE — Assessment & Plan Note (Signed)
Unclear if this is a sequela of her concussion or a more chronic problem. The fact that this symptom is starting as her other symptoms are improving make it less likely to be a sequela of her concussion, though not ruled out. Will check CBC, TSH, and vitamin D levels. If normal would likely need sleep study.

## 2016-05-28 NOTE — Progress Notes (Signed)
   Subjective:  Anna Anderson is a 36 y.o. female who presents to the [facility] today with a chief complaint of fatigue.   HPI:  Fatigue Symptoms started about 2 weeks ago. No known or obvious precipitating events, however patient was diagnosed with a concussion about 6 weeks ago. She had post-concussive symptoms including headache and lightheadedness for several weeks that seems to have improved significantly (though she was seen in urgent care yesterday for dizziness and here at the [facility] for these symptoms about a week ago). She has had negative work up for these symptoms including negative head CT. Her main concern for today is fatigue and reports that her other symptoms have essentially resolved.    Patient reports that she just feels "exhausted" all the time and is sleeping from 10-12 hours a day. She denies any difficulty falling asleep. She also denies daytime somnolence but also reports that she sleeps for most of the day anyway. She was sleeping "all day" while she was on the meclizine, which has improved since she stopped taking it. She occasionally snores. No PND. No syncopal episodes. No nausea or vomiting. No fevers. No constipation or diarrhea. Occasionally gets hot flashes.   ROS: Per HPI  PMH: Smoking history reviewed.   Objective:  Physical Exam: BP 132/70   Pulse 89   Temp 98.8 F (37.1 C) (Oral)   Wt 231 lb 12.8 oz (105.1 kg)   LMP 05/10/2016 (Exact Date)   SpO2 98%   BMI 39.79 kg/m   Gen: NAD, resting comfortably CV: RRR with no murmurs appreciated Pulm: NWOB, CTAB with no crackles, wheezes, or rhonchi GI: Normal bowel sounds present. Soft, Nontender, Nondistended. MSK: no edema, cyanosis, or clubbing noted Skin: warm, dry Neuro: grossly normal, moves all extremities Psych: Normal affect and thought content  Assessment/Plan:  Fatigue Unclear if this is a sequela of her concussion or a more chronic problem. The fact that this symptom is starting as her other  symptoms are improving make it less likely to be a sequela of her concussion, though not ruled out. Will check CBC, TSH, and vitamin D levels. If normal would likely need sleep study.  Algis Greenhouse. Jerline Pain, [place] Resident PGY-3 05/28/2016 4:06 PM

## 2016-05-29 ENCOUNTER — Telehealth: Payer: Self-pay | Admitting: Family Medicine

## 2016-05-29 DIAGNOSIS — R5383 Other fatigue: Secondary | ICD-10-CM

## 2016-05-29 LAB — VITAMIN D 25 HYDROXY (VIT D DEFICIENCY, FRACTURES): VIT D 25 HYDROXY: 24 ng/mL — AB (ref 30–100)

## 2016-05-29 NOTE — Telephone Encounter (Signed)
Would like results from yesterdays labs

## 2016-05-29 NOTE — Telephone Encounter (Signed)
2nd request. ep °

## 2016-05-29 NOTE — Telephone Encounter (Signed)
Called patient to discuss lab results. Notable for low vitamin D. TSH and CBC within normal limits. Instructed patient to start vitamin D supplementation with at least 800-1200IU daily. Will also order split night sleep study.  Asked patient to follow up in 1-2 weeks.  Patient voiced understanding and had no further questions.  Algis Greenhouse. Jerline Pain, [place] Resident PGY-3 05/29/2016 11:21 AM

## 2016-06-02 ENCOUNTER — Encounter: Payer: Self-pay | Admitting: Neurology

## 2016-06-15 ENCOUNTER — Ambulatory Visit (INDEPENDENT_AMBULATORY_CARE_PROVIDER_SITE_OTHER): Payer: Self-pay | Admitting: Neurology

## 2016-06-15 ENCOUNTER — Encounter: Payer: Self-pay | Admitting: Neurology

## 2016-06-15 VITALS — BP 142/100 | HR 87 | Ht 64.0 in | Wt 227.0 lb

## 2016-06-15 DIAGNOSIS — F419 Anxiety disorder, unspecified: Secondary | ICD-10-CM

## 2016-06-15 DIAGNOSIS — F411 Generalized anxiety disorder: Secondary | ICD-10-CM | POA: Insufficient documentation

## 2016-06-15 DIAGNOSIS — F0781 Postconcussional syndrome: Secondary | ICD-10-CM

## 2016-06-15 MED ORDER — VENLAFAXINE HCL ER 37.5 MG PO CP24: 75.0000 mg | ORAL_CAPSULE | Freq: Every day | ORAL | 6 refills | Status: DC
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
## 2016-06-16 ENCOUNTER — Encounter: Payer: Self-pay | Admitting: *Deleted

## 2016-07-03 ENCOUNTER — Ambulatory Visit (HOSPITAL_COMMUNITY)
Admission: RE | Admit: 2016-07-03 | Discharge: 2016-07-03 | Disposition: A | Discharge status: Home / Self Care | Payer: Medicaid Other | Source: Ambulatory Visit | Attending: Family Medicine | Admitting: Family Medicine

## 2016-07-03 ENCOUNTER — Encounter: Payer: Self-pay | Admitting: Family Medicine

## 2016-07-03 ENCOUNTER — Ambulatory Visit (INDEPENDENT_AMBULATORY_CARE_PROVIDER_SITE_OTHER): Payer: Self-pay | Admitting: Family Medicine

## 2016-07-03 VITALS — BP 130/80 | HR 86 | Temp 98.3°F | Ht 64.0 in | Wt 233.0 lb

## 2016-07-03 DIAGNOSIS — D649 Anemia, unspecified: Secondary | ICD-10-CM

## 2016-07-03 DIAGNOSIS — R0602 Shortness of breath: Secondary | ICD-10-CM

## 2016-07-03 DIAGNOSIS — I878 Other specified disorders of veins: Secondary | ICD-10-CM | POA: Insufficient documentation

## 2016-07-03 NOTE — Patient Instructions (Signed)
It was nice meeting you today! You were seen in clinic for new onset shortness of breath you have been experiencing for the past 2 weeks.  I do not think this is related to your concussion at this time.  I would like to make sure your shortness of breath is not due to anything going on in your heart or lungs.  We did an EKG today in clinic and it was normal.  In addition, we walked you while checking your oxygen level and it remained normal.  You had some bloodwork done today and I will follow up with you with the results.  You can hold off on taking anxiety medication for now.    -You can go to [facility] for your chest x-ray and see me back in 1 week.   Be well,  Lovenia Kim, MD
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
## 2016-07-04 LAB — CBC
HEMATOCRIT: 37.6 % (ref 34.0–46.6)
HEMOGLOBIN: 12 g/dL (ref 11.1–15.9)
MCH: 28.8 pg (ref 26.6–33.0)
MCHC: 31.9 g/dL (ref 31.5–35.7)
MCV: 90 fL (ref 79–97)
Platelets: 318 10*3/uL (ref 150–379)
RBC: 4.16 x10E6/uL (ref 3.77–5.28)
RDW: 14.8 % (ref 12.3–15.4)
WBC: 7.7 10*3/uL (ref 3.4–10.8)

## 2016-07-04 LAB — BRAIN NATRIURETIC PEPTIDE: BNP: 15.5 pg/mL (ref 0.0–100.0)

## 2016-07-04 LAB — D-DIMER, QUANTITATIVE (NOT AT ARMC): D-DIMER: 0.49 mg{FEU}/L (ref 0.00–0.49)

## 2016-07-06 ENCOUNTER — Telehealth: Payer: Self-pay | Admitting: Family Medicine

## 2016-07-06 NOTE — Telephone Encounter (Signed)
Patient came to office would like pcp to call her as soon as possible to discuss results from xrays. [phone]

## 2016-07-06 NOTE — Telephone Encounter (Signed)
Covering for Dr. Ardelia Mems. Will forward message to Dr. Reesa Chew who evaluated the patient and ordered xray on 4/6.

## 2016-07-07 ENCOUNTER — Telehealth: Payer: Self-pay | Admitting: Family Medicine

## 2016-07-07 ENCOUNTER — Encounter: Payer: Self-pay | Admitting: Family Medicine

## 2016-07-07 ENCOUNTER — Other Ambulatory Visit: Payer: Self-pay | Admitting: Family Medicine

## 2016-07-07 DIAGNOSIS — R0602 Shortness of breath: Secondary | ICD-10-CM

## 2016-07-07 NOTE — Telephone Encounter (Signed)
Pt called again about her results from blood work and xray

## 2016-07-07 NOTE — Telephone Encounter (Signed)
Pt called again. Pt would like PCP to call with test results and would like to make sure it is ok to take B12. ep

## 2016-07-07 NOTE — Telephone Encounter (Signed)
Called patient to discuss results of bloodwork and CXR.  BNP within normal limits but CXR with cardiomegaly and evidence of mild pulmonary vascular congestion.  Given her new onset shortness of breath and history of hypertension, have ordered an echocardiogram.  She will be contacted regarding scheduling.  Patient expressed understanding and all questions answered. Patient notes she has been breathing better yesterday and today but would like to go ahead and proceed with the echo just to be on the safe side.

## 2016-07-07 NOTE — Telephone Encounter (Signed)
Already called patient to discuss.  She will be getting an echocardiogram this week. Thanks!

## 2016-07-17 ENCOUNTER — Telehealth: Payer: Self-pay | Admitting: Family Medicine

## 2016-07-17 NOTE — Telephone Encounter (Signed)
Patient has been seen by Drs. Jerline Pain and Amin recently for fatigue and shortness of breath. On review, it appears Dr. Jerline Pain ordered sleep study, which seems reasonable.  Please ask patient to proceed with sleep study as scheduled. Thanks Leeanne Rio, MD

## 2016-07-17 NOTE — Telephone Encounter (Signed)
Also it appears her echo has not ever been scheduled or completed - please take care of this as well. Thanks! Leeanne Rio, MD

## 2016-07-17 NOTE — Telephone Encounter (Signed)
Pt would like to know if PCP still wanted her to do the sleep study on Monday since other tests were ok. Please call pt today and let her know. ep

## 2016-07-20 ENCOUNTER — Telehealth: Payer: Self-pay | Admitting: Family Medicine

## 2016-07-20 ENCOUNTER — Ambulatory Visit (HOSPITAL_BASED_OUTPATIENT_CLINIC_OR_DEPARTMENT_OTHER): Payer: Medicaid Other

## 2016-07-20 NOTE — Telephone Encounter (Signed)
Anna Anderson was in today for her twins' new patient appointment with me and had many questions about her personal medical problems. She is curious about whether she can exercise when she is still experiencing lightheadedness and blurry vision on occasion due to her post concussive syndrome. She would like to exercise to get her BP and weight under better control. Additionally, she states that she is not going to get the Echo as she spoke to the neurologist (although based on her report it could have been the radiologist) about her CXRs, who reported that her cardiomegaly was due to HTN and stable from previous. She says she was told that the echo was optional. Notably, she is going for the sleep study tonight. She also complains of fatigue. I recommended that she get the sleep study and discuss all of this with her PCP. Attempted to schedule appt with PCP but no appts available through July. Told Anna Anderson I would send Dr. Ardelia Mems a message and that she could also use MyChart to contact Dr. Ardelia Mems.

## 2016-07-20 NOTE — Telephone Encounter (Signed)
Pt informed while in office this morning. States that she is not doing the echo. Anna Anderson Holter, CMA

## 2016-07-20 NOTE — Telephone Encounter (Signed)
Patient needs to know asap this morning if she still needs to go to the sleep study scheduled for this evening, please.  Best # [phone]

## 2016-08-05 ENCOUNTER — Telehealth: Payer: Self-pay | Admitting: Family Medicine

## 2016-08-05 NOTE — Telephone Encounter (Signed)
Ok thank you for following up with that.

## 2016-08-05 NOTE — Telephone Encounter (Signed)
Had left vm for patient to call and schedule echo.  Spoke with [name] today and she don't want to do the echocardiogram.   Will close the referral.

## 2016-08-27 ENCOUNTER — Encounter (HOSPITAL_COMMUNITY): Payer: Self-pay | Admitting: Emergency Medicine

## 2016-08-27 ENCOUNTER — Emergency Department (HOSPITAL_COMMUNITY): Payer: Medicaid Other

## 2016-08-27 ENCOUNTER — Emergency Department (HOSPITAL_COMMUNITY)
Admission: EM | Admit: 2016-08-27 | Discharge: 2016-08-27 | Disposition: A | Discharge status: Home / Self Care | Payer: Medicaid Other | Attending: Emergency Medicine | Admitting: Emergency Medicine

## 2016-08-27 DIAGNOSIS — Z5321 Procedure and treatment not carried out due to patient leaving prior to being seen by health care provider: Secondary | ICD-10-CM | POA: Insufficient documentation

## 2016-08-27 DIAGNOSIS — R079 Chest pain, unspecified: Secondary | ICD-10-CM | POA: Insufficient documentation

## 2016-08-27 LAB — BASIC METABOLIC PANEL
Anion gap: 9 (ref 5–15)
BUN: 10 mg/dL (ref 6–20)
CALCIUM: 9.2 mg/dL (ref 8.9–10.3)
CO2: 27 mmol/L (ref 22–32)
CREATININE: 0.89 mg/dL (ref 0.44–1.00)
Chloride: 100 mmol/L — ABNORMAL LOW (ref 101–111)
GFR calc Af Amer: >60 mL/min (ref >60)
GLUCOSE: 109 mg/dL — AB (ref 65–99)
Potassium: 2.9 mmol/L — ABNORMAL LOW (ref 3.5–5.1)
Sodium: 136 mmol/L (ref 135–145)

## 2016-08-27 LAB — CBC
HCT: 38 % (ref 36.0–46.0)
Hemoglobin: 12.4 g/dL (ref 12.0–15.0)
MCH: 29.2 pg (ref 26.0–34.0)
MCHC: 32.6 g/dL (ref 30.0–36.0)
MCV: 89.6 fL (ref 78.0–100.0)
Platelets: 282 10*3/uL (ref 150–400)
RBC: 4.24 MIL/uL (ref 3.87–5.11)
RDW: 13.2 % (ref 11.5–15.5)
WBC: 8.6 10*3/uL (ref 4.0–10.5)

## 2016-08-27 LAB — I-STAT TROPONIN, ED: TROPONIN I, POC: 0 ng/mL (ref 0.00–0.08)

## 2016-08-27 NOTE — ED Triage Notes (Signed)
Pt reports CP present X several weeks, center of chest and non radiating. Pt also endorses dizziness, and blurred vision, was dc with post concussive syndrome in February and thinks it is related.

## 2016-08-28 ENCOUNTER — Ambulatory Visit (INDEPENDENT_AMBULATORY_CARE_PROVIDER_SITE_OTHER): Payer: Medicaid Other | Admitting: Family Medicine

## 2016-08-28 VITALS — BP 140/100 | HR 107 | Temp 98.4°F | Wt 224.0 lb

## 2016-08-28 DIAGNOSIS — I10 Essential (primary) hypertension: Secondary | ICD-10-CM | POA: Diagnosis not present

## 2016-08-28 DIAGNOSIS — F411 Generalized anxiety disorder: Secondary | ICD-10-CM

## 2016-08-28 DIAGNOSIS — E876 Hypokalemia: Secondary | ICD-10-CM | POA: Insufficient documentation

## 2016-08-28 MED ORDER — CITALOPRAM HYDROBROMIDE 20 MG PO TABS: 20.0000 mg | ORAL_TABLET | Freq: Every day | ORAL | 3 refills | Status: DC

## 2016-08-28 NOTE — Assessment & Plan Note (Addendum)
Acute. Tolerating diet without restrictions. On HCTZ. EKG reassuring with no ST-changes or T-wave abnormalities. No arrhythmias. --Will recheck K and obtain Mg lvl --If low will recommend K-dur 40 mg daily for 2 weeks --RTC in 1 week

## 2016-08-28 NOTE — Assessment & Plan Note (Addendum)
Chronic. Uncontrolled. Was seen by neurology and Rx Effexor but pt not taking. Has feeling of palpitations and SOB during episodes. Seems to be related to fear of "declining health" but no specific times when triggered. Safe home environment per patient. GAD-7 score 16. --Instructed to not take Effexor --Initiating Celexa 20 mg daily, will increase to 40 mg daily in 6-8 weeks --Discussed importance of good support group --Reassured patients health is being taken care of and not deteriorating

## 2016-08-28 NOTE — Patient Instructions (Signed)
Thank you for coming in to see [place] today. Please see below to review our plan for today's visit.  1. The workup he received at the emergency department showed that you have a low potassium level. I will recheck your blood work and let she now if this persists. If it continues to be low, I will call in a prescription for a potassium supplement which she will take daily. 2. I agree that you're symptoms are due to your anxiety. I will start you on a medication called Celexa. Anticipate this taking 6-8 weeks before it kicks in. He might get a sensation of more energy prior to improvement of your anxiety. Common side effects include GI upset and sexual dysfunction. 3. Return to the clinic in 1-1.5 weeks.  Please call the clinic at [phone] if your symptoms worsen or you have any concerns. It was my pleasure to see you. -- Harriet [place], [place], PGY-1  Citalopram oral solution What is this medicine? CITALOPRAM (sye TAL oh pram) is used to treat depression. This medicine may be used for other purposes; ask your health care provider or pharmacist if you have questions. COMMON BRAND NAME(S): Celexa What should I tell my health care provider before I take this medicine? They need to know if you have any of these conditions: -bleeding disorders -bipolar disorder or a family history of bipolar disorder -glaucoma -heart disease -history of irregular heartbeat -kidney disease -liver disease -low levels of magnesium or potassium in the blood -receiving electroconvulsive therapy -seizures -suicidal thoughts, plans, or attempt; a previous suicide attempt by you or a family member -take medicines that treat or prevent blood clots -thyroid disease -an unusual or allergic reaction to citalopram, escitalopram, other medicines, foods, dyes, or preservatives -pregnant or trying to become pregnant -breast-feeding How should I use this medicine? Take this medicine by mouth.  Follow the directions on the prescription label. Use a specially marked spoon or container to measure your medicine. Ask your pharmacist if you do not have one. Household spoons are not accurate. This medicine can be taken with or without food. Take your medicine at regular intervals. Do not take your medicine more often than directed. Do not stop taking this medicine suddenly except upon the advice of your doctor. Stopping this medicine too quickly may cause serious side effects or your condition may worsen. A special MedGuide will be given to you by the pharmacist with each prescription and refill. Be sure to read this information carefully each time. Talk to your pediatrician regarding the use of this medicine in children. Special care may be needed. Patients over 19 years old may have a stronger reaction and need a smaller dose. Overdosage: If you think you have taken too much of this medicine contact a poison control center or emergency room at once. NOTE: This medicine is only for you. Do not share this medicine with others. What if I miss a dose? If you miss a dose, take it as soon as you can. If it is almost time for your next dose, take only that dose. Do not take double or extra doses. What may interact with this medicine? Do not take this medicine with any of the following medications: -certain medicines for fungal infections like fluconazole, itraconazole, ketoconazole, posaconazole, voriconazole -cisapride -dofetilide -dronedarone -escitalopram -linezolid -MAOIs like Carbex, Eldepryl, Marplan, Nardil, and Parnate -methylene blue (injected into a vein) -pimozide -thioridazine -ziprasidone This medicine may also interact with the following medications: -alcohol -amphetamines -aspirin  and aspirin-like medicines -carbamazepine -certain medicines for depression, anxiety, or psychotic disturbances -certain medicines for infections like chloroquine, clarithromycin, erythromycin,  furazolidone, isoniazid, pentamidine -certain medicines for migraine headaches like almotriptan, eletriptan, frovatriptan, naratriptan, rizatriptan, sumatriptan, zolmitriptan -certain medicines for sleep -certain medicines that treat or prevent blood clots like dalteparin, enoxaparin, warfarin -cimetidine -diuretics -fentanyl -lithium -methadone -metoprolol -NSAIDs, medicines for pain and inflammation, like ibuprofen or naproxen -omeprazole -other medicines that prolong the QT interval (cause an abnormal heart rhythm) -procarbazine -rasagiline -supplements like St. John's wort, kava kava, valerian -tramadol -tryptophan This list may not describe all possible interactions. Give your health care provider a list of all the medicines, herbs, non-prescription drugs, or dietary supplements you use. Also tell them if you smoke, drink alcohol, or use illegal drugs. Some items may interact with your medicine. What should I watch for while using this medicine? Tell your doctor if your symptoms do not get better or if they get worse. Visit your doctor or health care professional for regular checks on your progress. Because it may take several weeks to see the full effects of this medicine, it is important to continue your treatment as prescribed by your doctor. Patients and their families should watch out for new or worsening thoughts of suicide or depression. Also watch out for sudden changes in feelings such as feeling anxious, agitated, panicky, irritable, hostile, aggressive, impulsive, severely restless, overly excited and hyperactive, or not being able to sleep. If this happens, especially at the beginning of treatment or after a change in dose, call your health care professional. Dennis Bast may get drowsy or dizzy. Do not drive, use machinery, or do anything that needs mental alertness until you know how this medicine affects you. Do not stand or sit up quickly, especially if you are an older patient.  This reduces the risk of dizzy or fainting spells. Alcohol may interfere with the effect of this medicine. Avoid alcoholic drinks. Your mouth may get dry. Chewing sugarless gum or sucking hard candy, and drinking plenty of water will help. Contact your doctor if the problem does not go away or is severe. What side effects may I notice from receiving this medicine? Side effects that you should report to your doctor or health care professional as soon as possible: -allergic reactions like skin rash, itching or hives, swelling of the face, lips, or tongue -anxious -black, tarry stools -breathing problems -changes in vision -chest pain -confusion -elevated mood, decreased need for sleep, racing thoughts, impulsive behavior -eye pain -fast, irregular heartbeat -feeling faint or lightheaded, falls -feeling agitated, angry, or irritable -hallucination, loss of contact with reality -loss of balance or coordination -loss of memory -painful or prolonged erections -restlessness, pacing, inability to keep still -seizures -stiff muscles -suicidal thoughts or other mood changes -trouble sleeping -unusual bleeding or bruising -unusually weak or tired -vomiting Side effects that usually do not require medical attention (report to your doctor or health care professional if they continue or are bothersome): -change in appetite or weight -change in sex drive or performance -dizziness -headache -increased sweating -indigestion, nausea -tremors This list may not describe all possible side effects. Call your doctor for medical advice about side effects. You may report side effects to FDA at 1-800-FDA-1088. Where should I keep my medicine? Keep out of reach of children. Store at room temperature between 15 and 30 degrees C (59 and 86 degrees F). Throw away any unused medicine after the expiration date. NOTE: This sheet is a summary. It may not cover  all possible information. If you have questions  about this medicine, talk to your doctor, pharmacist, or health care provider.  2018 Elsevier/Gold Standard (2015-08-19 13:15:14)
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
## 2016-08-28 NOTE — Assessment & Plan Note (Addendum)
Chronic. Elevated today due to not taking BP meds this morning. On amlodipine 10 mg daily and HCTZ 25 mg daily. --Continue amlodipine 10 mg daily and HCTZ 25 mg daily --Discussed ways to help alleviate anxious feelings and establishing support system --See GAD

## 2016-08-29 LAB — BASIC METABOLIC PANEL
BUN/Creatinine Ratio: 16 (ref 9–23)
BUN: 13 mg/dL (ref 6–20)
CALCIUM: 9.3 mg/dL (ref 8.7–10.2)
CO2: 25 mmol/L (ref 18–29)
Chloride: 102 mmol/L (ref 96–106)
Creatinine, Ser: 0.81 mg/dL (ref 0.57–1.00)
GFR calc Af Amer: 109 mL/min/{1.73_m2} (ref >59)
GFR, EST NON AFRICAN AMERICAN: 94 mL/min/{1.73_m2} (ref >59)
Glucose: 111 mg/dL — ABNORMAL HIGH (ref 65–99)
Potassium: 3.4 mmol/L — ABNORMAL LOW (ref 3.5–5.2)
Sodium: 142 mmol/L (ref 134–144)

## 2016-08-29 LAB — MAGNESIUM: Magnesium: 2.2 mg/dL (ref 1.6–2.3)

## 2016-08-30 ENCOUNTER — Encounter: Payer: Self-pay | Admitting: Family Medicine

## 2016-08-31 ENCOUNTER — Telehealth: Payer: Self-pay | Admitting: Family Medicine

## 2016-08-31 NOTE — Telephone Encounter (Signed)
Called patient regarding low potassium level diagnosed in ED. Improved from 2.9, now at 3.4. Recommended not initiating potassium supplement at this time. Patient understands with no further questions. -- Anna Anderson, [place], PGY-1

## 2016-09-11 ENCOUNTER — Ambulatory Visit (HOSPITAL_BASED_OUTPATIENT_CLINIC_OR_DEPARTMENT_OTHER): Payer: Medicaid Other | Attending: Family Medicine

## 2016-11-21 ENCOUNTER — Emergency Department (HOSPITAL_COMMUNITY): Payer: Medicaid Other

## 2016-11-21 ENCOUNTER — Encounter (HOSPITAL_COMMUNITY): Payer: Self-pay | Admitting: *Deleted

## 2016-11-21 DIAGNOSIS — Z79899 Other long term (current) drug therapy: Secondary | ICD-10-CM | POA: Insufficient documentation

## 2016-11-21 DIAGNOSIS — I10 Essential (primary) hypertension: Secondary | ICD-10-CM | POA: Diagnosis not present

## 2016-11-21 DIAGNOSIS — F419 Anxiety disorder, unspecified: Secondary | ICD-10-CM | POA: Diagnosis not present

## 2016-11-21 DIAGNOSIS — R079 Chest pain, unspecified: Secondary | ICD-10-CM | POA: Diagnosis present

## 2016-11-21 DIAGNOSIS — D171 Benign lipomatous neoplasm of skin and subcutaneous tissue of trunk: Secondary | ICD-10-CM | POA: Insufficient documentation

## 2016-11-21 LAB — CBC
HEMATOCRIT: 40.4 % (ref 36.0–46.0)
Hemoglobin: 12.9 g/dL (ref 12.0–15.0)
MCH: 28.6 pg (ref 26.0–34.0)
MCHC: 31.9 g/dL (ref 30.0–36.0)
MCV: 89.6 fL (ref 78.0–100.0)
PLATELETS: 311 10*3/uL (ref 150–400)
RBC: 4.51 MIL/uL (ref 3.87–5.11)
RDW: 13.5 % (ref 11.5–15.5)
WBC: 9.2 10*3/uL (ref 4.0–10.5)

## 2016-11-21 LAB — BASIC METABOLIC PANEL
Anion gap: 10 (ref 5–15)
BUN: 12 mg/dL (ref 6–20)
CO2: 26 mmol/L (ref 22–32)
CREATININE: 0.99 mg/dL (ref 0.44–1.00)
Calcium: 9.7 mg/dL (ref 8.9–10.3)
Chloride: 102 mmol/L (ref 101–111)
GFR calc Af Amer: >60 mL/min (ref >60)
GLUCOSE: 99 mg/dL (ref 65–99)
Potassium: 2.9 mmol/L — ABNORMAL LOW (ref 3.5–5.1)
SODIUM: 138 mmol/L (ref 135–145)

## 2016-11-21 LAB — I-STAT TROPONIN, ED: Troponin i, poc: 0 ng/mL (ref 0.00–0.08)

## 2016-11-21 NOTE — ED Triage Notes (Signed)
Pt has been having CP today.  Pt reports soreness under her left breast and in her left/central chest, she reports that the area under her left breast is swollen.  Pain increases with palpation.

## 2016-11-22 ENCOUNTER — Other Ambulatory Visit: Payer: Self-pay

## 2016-11-22 ENCOUNTER — Emergency Department (HOSPITAL_COMMUNITY)
Admission: EM | Admit: 2016-11-22 | Discharge: 2016-11-22 | Disposition: A | Discharge status: Home / Self Care | Payer: Medicaid Other | Attending: Emergency Medicine | Admitting: Emergency Medicine

## 2016-11-22 DIAGNOSIS — F419 Anxiety disorder, unspecified: Secondary | ICD-10-CM

## 2016-11-22 DIAGNOSIS — D171 Benign lipomatous neoplasm of skin and subcutaneous tissue of trunk: Secondary | ICD-10-CM

## 2016-11-22 DIAGNOSIS — R079 Chest pain, unspecified: Secondary | ICD-10-CM

## 2016-11-22 MED ORDER — POTASSIUM CHLORIDE CRYS ER 20 MEQ PO TBCR
40.0000 meq | EXTENDED_RELEASE_TABLET | Freq: Once | ORAL | Status: AC
  Administered 2016-11-22: 40 meq via ORAL
  Filled 2016-11-22: qty 2

## 2016-11-22 NOTE — ED Provider Notes (Signed)
[facility] DEPT Provider Note   CSN: [id number] Arrival date & time: 11/21/16  2149     History   Chief Complaint Chief Complaint  Patient presents with  . Chest Pain    HPI Anna Anderson is a 36 y.o. female.  Patient presents emergency department with chief complaint chest pain. She states that she has been having intermittent chest pain, and reports that this feels similar to her prior anxiety and panic attacks. She denies any shortness breath, but states that she does get very flushed and nervous when she has these symptoms. She denies any lightheadedness, dizziness, diaphoresis, or vomiting. She has been prescribed anxiety medications by her doctor, but is noncompliant in taking them. Additionally, she reports that she has had a mass on her left anterior chest wall, which she noticed today. She denies any redness, heat color discharge. Reports mild discomfort.   The history is provided by the patient. No language interpreter was used.    Past Medical History:  Diagnosis Date  . Concussion   . Hypertension     Patient Active Problem List   Diagnosis Date Noted  . Hypokalemia 08/28/2016  . Generalized anxiety disorder 06/15/2016  . 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 Palpitations 04/12/2015  . Headache 07/24/2014  . Vitamin D deficiency 07/24/2014  . Fatigue 06/06/2014  . Chest discomfort 06/06/2014  . Depression 05/01/2013  . Obesity (BMI 30-39.9) 12/15/2012  . Myalgia 12/15/2012  . 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 Primary hypertension 05/27/2006  . MENSTRUATION, PAINFUL 05/27/2006  . HIRSUTISM 05/27/2006    Past Surgical History:  Procedure Laterality Date  . CESAREAN SECTION     x3  . TUBAL LIGATION      OB History    No data available       Home Medications    Prior to Admission medications   Medication Sig Start Date End Date Taking? Authorizing Provider  amLODipine (NORVASC) 10 MG tablet Take 1 tablet (10 mg total) by mouth daily.  05/18/16  Yes Sela Hilding, MD  hydrochlorothiazide (HYDRODIURIL) 25 MG tablet Take 1 tablet (25 mg total) by mouth daily. 05/18/16  Yes Sela Hilding, MD  citalopram (CELEXA) 20 MG tablet Take 1 tablet (20 mg total) by mouth daily. Patient not taking: Reported on 11/22/2016 08/28/16   [place] Bing, DO  ibuprofen (ADVIL,MOTRIN) 800 MG tablet Take 1 tablet (800 mg total) by mouth every 8 (eight) hours as needed for mild pain. Patient not taking: Reported on 11/22/2016 05/12/16   Ward, Delice Bison, DO  venlafaxine XR (EFFEXOR XR) 37.5 MG 24 hr capsule Take 2 capsules (75 mg total) by mouth daily with breakfast. Patient not taking: Reported on 08/28/2016 06/15/16   Marcial Pacas, MD    Family History Family History  Problem Relation Age of Onset  . Diabetes Mother   . Heart disease Mother   . Healthy Father     Social History Social History  Substance Use Topics  . Smoking status: Never Smoker  . Smokeless tobacco: Never Used  . Alcohol use No     Allergies   Amoxicillin and Lisinopril   Review of Systems Review of Systems  All other systems reviewed and are negative.    Physical Exam Updated Vital Signs BP (!) 145/101   Pulse 76   Temp 98.2 F (36.8 C) (Oral)   Resp 18   Wt 101.6 kg (224 lb)   SpO2 100%   BMI 39.68 kg/m   Physical Exam  Constitutional:  She is oriented to person, place, and time. She appears well-developed and well-nourished.  HENT:  Head: Normocephalic and atraumatic.  Eyes: Pupils are equal, round, and reactive to light. Conjunctivae and EOM are normal.  Neck: Normal range of motion. Neck supple.  Cardiovascular: Normal rate and regular rhythm.  Exam reveals no gallop and no friction rub.   No murmur heard. Pulmonary/Chest: Effort normal and breath sounds normal. No respiratory distress. She has no wheezes. She has no rales. She exhibits no tenderness.  Left inferior anterior chest wall remarkable for 6 x 6 cm lipoma   Abdominal: Soft.  Bowel sounds are normal. She exhibits no distension and no mass. There is no tenderness. There is no rebound and no guarding.  Musculoskeletal: Normal range of motion. She exhibits no edema or tenderness.  Neurological: She is alert and oriented to person, place, and time.  Skin: Skin is warm and dry.  No evidence of cellulitis or abscess  Psychiatric: She has a normal mood and affect. Her behavior is normal. Judgment and thought content normal.  Nursing note and vitals reviewed.    ED Treatments / Results  Labs (all labs ordered are listed, but only abnormal results are displayed) Labs Reviewed  BASIC METABOLIC PANEL - Abnormal; Notable for the following:       Result Value   Potassium 2.9 (*)    All other components within normal limits  CBC  I-STAT TROPONIN, ED  POC URINE PREG, ED    EKG  EKG Interpretation None       Radiology Dg Chest 2 View  Result Date: 11/21/2016 CLINICAL DATA:  Chest pain today. EXAM: CHEST  2 VIEW COMPARISON:  08/27/2016 FINDINGS: The cardiomediastinal contours are normal, heart upper normal in size. The lungs are clear. Pulmonary vasculature is normal. No consolidation, pleural effusion, or pneumothorax. No acute osseous abnormalities are seen. IMPRESSION: Upper normal heart size, unchanged.  No acute pulmonary process. Electronically Signed   By: Jeb Levering M.D.   On: 11/21/2016 22:27    Procedures Procedures (including critical care time)  Medications Ordered in ED Medications  potassium chloride SA (K-DUR,KLOR-CON) CR tablet 40 mEq (40 mEq Oral Given 11/22/16 0418)     Initial Impression / Assessment and Plan / ED Course  I have reviewed the triage vital signs and the nursing notes.  Pertinent labs & imaging results that were available during my care of the patient were reviewed by me and considered in my medical decision making (see chart for details).     Patient with anxiety. Laboratory workup is reassuring save for a mild  hypokalemia. Will treat with potassium orally.  Patient seen by and discussed with Dr. Leonides Schanz, who agrees to chest wall mass is likely lipoma. Recommend outpatient workup. Patient feels comfortable with discharge.  PCP follow-up. Final Clinical Impressions(s) / ED Diagnoses   Final diagnoses:  Nonspecific chest pain  Anxiety  Lipoma of torso    New Prescriptions New Prescriptions   No medications on file     Montine Circle, Hershal Coria 11/22/16 [place], Delice Bison, DO 11/22/16 9622

## 2016-11-22 NOTE — ED Notes (Signed)
Dr Leonides Schanz given a copy of EKG that was run earlier from Triage

## 2016-11-23 LAB — POC URINE PREG, ED: PREG TEST UR: NEGATIVE

## 2017-01-01 ENCOUNTER — Encounter: Payer: Self-pay | Admitting: Family Medicine

## 2017-01-01 ENCOUNTER — Ambulatory Visit (INDEPENDENT_AMBULATORY_CARE_PROVIDER_SITE_OTHER): Payer: Medicaid Other | Admitting: Family Medicine

## 2017-01-01 VITALS — BP 128/82 | HR 47 | Temp 98.2°F | Wt 227.0 lb

## 2017-01-01 DIAGNOSIS — I10 Essential (primary) hypertension: Secondary | ICD-10-CM | POA: Diagnosis not present

## 2017-01-01 DIAGNOSIS — H8110 Benign paroxysmal vertigo, unspecified ear: Secondary | ICD-10-CM

## 2017-01-01 MED ORDER — HYDROCHLOROTHIAZIDE 25 MG PO TABS: 25.0000 mg | ORAL_TABLET | Freq: Every day | ORAL | 1 refills | Status: DC

## 2017-01-01 MED ORDER — AMLODIPINE BESYLATE 10 MG PO TABS: 10.0000 mg | ORAL_TABLET | Freq: Every day | ORAL | 1 refills | Status: DC

## 2017-01-01 NOTE — Patient Instructions (Signed)
Thank you for coming in to see [place] today. Please see below to review our plan for today's visit.  1. Use meclizine if your symptoms recur. If these become more frequent, I would advise to contact [facility]. 2. I sent him your prescriptions for your blood pressure medication.  Please call the clinic at [phone] if your symptoms worsen or you have any concerns. It was my pleasure to see you. -- Harriet Butte, [place], PGY-2
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
## 2017-01-11 ENCOUNTER — Other Ambulatory Visit: Payer: Self-pay | Admitting: Family Medicine

## 2017-01-12 ENCOUNTER — Encounter: Payer: Self-pay | Admitting: *Deleted

## 2017-01-12 NOTE — Telephone Encounter (Signed)
Will send in rx right now. Please inform patient. Please also ask her to schedule appointment to be seen. She's overdue on some health maintenance items, and we also need to recheck her potassium levels.  Thanks Leeanne Rio, MD

## 2017-01-12 NOTE — Telephone Encounter (Signed)
Patient came to office stated this is second day without her bp meds. Please let her know when done. [phone]

## 2017-01-19 NOTE — Telephone Encounter (Signed)
Attempted to call pt, no answer. Anna Anderson, CMA
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
## 2017-01-27 ENCOUNTER — Ambulatory Visit (INDEPENDENT_AMBULATORY_CARE_PROVIDER_SITE_OTHER): Payer: Medicaid Other | Admitting: Internal Medicine

## 2017-01-27 ENCOUNTER — Other Ambulatory Visit: Payer: Self-pay | Admitting: Internal Medicine

## 2017-01-27 ENCOUNTER — Encounter: Payer: Self-pay | Admitting: Internal Medicine

## 2017-01-27 VITALS — BP 148/90 | HR 88 | Temp 98.0°F | Ht 63.0 in | Wt 229.0 lb

## 2017-01-27 DIAGNOSIS — M799 Soft tissue disorder, unspecified: Secondary | ICD-10-CM | POA: Diagnosis not present

## 2017-01-27 DIAGNOSIS — N6320 Unspecified lump in the left breast, unspecified quadrant: Secondary | ICD-10-CM

## 2017-01-27 DIAGNOSIS — H8110 Benign paroxysmal vertigo, unspecified ear: Secondary | ICD-10-CM | POA: Diagnosis present

## 2017-01-27 DIAGNOSIS — M7989 Other specified soft tissue disorders: Secondary | ICD-10-CM

## 2017-01-27 DIAGNOSIS — N63 Unspecified lump in unspecified breast: Secondary | ICD-10-CM

## 2017-01-27 NOTE — Patient Instructions (Addendum)
We ordered Vestibular PT to see if this helps with your vertigo.  I would call neurology to let them know about your eye symptoms  Follow up in about 4 weeks with your PCP to discuss anxiety

## 2017-02-02 ENCOUNTER — Ambulatory Visit
Admission: RE | Admit: 2017-02-02 | Discharge: 2017-02-02 | Disposition: A | Discharge status: Home / Self Care | Payer: Medicaid Other | Source: Ambulatory Visit | Attending: Family Medicine | Admitting: Family Medicine

## 2017-02-02 ENCOUNTER — Other Ambulatory Visit: Payer: Self-pay | Admitting: Family Medicine

## 2017-02-02 ENCOUNTER — Telehealth: Payer: Self-pay | Admitting: *Deleted

## 2017-02-02 ENCOUNTER — Ambulatory Visit: Payer: Self-pay

## 2017-02-02 DIAGNOSIS — N63 Unspecified lump in unspecified breast: Secondary | ICD-10-CM

## 2017-02-02 DIAGNOSIS — M7989 Other specified soft tissue disorders: Secondary | ICD-10-CM

## 2017-02-02 NOTE — Telephone Encounter (Signed)
Covering inbox for Dr. Dallas Schimke   Looking at Dr. Perlie Mayo previous note for this patient, there was some soft tissue puffiness on left upper abdominal region although the exam was limited due to body habitus. Will go ahead and order an abdominal U/S. Thank you.    Smitty Cords, MD [place], PGY-3

## 2017-02-02 NOTE — Telephone Encounter (Signed)
Pt came into office today because [facility] and they stated that the area that was to be done was too low for them to do it and she shouldn't have come to the breast center for this. Said that if the area had been on or even closer to the breast that they could have done it but it is more on the abdomen area. If you will put in the order for the correct [place] we will be happy to reschedule for pt.  She prefers mornings. Routing to doctor that ordered this.  Katharina Caper, Jakayden Cancio D, [place]

## 2017-02-02 NOTE — Telephone Encounter (Signed)
Contacted pt and informed her that the order had been changed and her appointment is set for 02/08/17 @ [facility], she is to arrive at 9:55am for a 10:15 appointment.Katharina Caper, Ryler Laskowski D, [place]

## 2017-02-08 ENCOUNTER — Telehealth: Payer: Self-pay | Admitting: *Deleted

## 2017-02-08 ENCOUNTER — Ambulatory Visit
Admission: RE | Admit: 2017-02-08 | Discharge: 2017-02-08 | Disposition: A | Discharge status: Home / Self Care | Payer: Medicaid Other | Source: Ambulatory Visit | Attending: Family Medicine | Admitting: Family Medicine

## 2017-02-08 ENCOUNTER — Encounter (HOSPITAL_COMMUNITY): Payer: Self-pay | Admitting: Internal Medicine

## 2017-02-08 DIAGNOSIS — M7989 Other specified soft tissue disorders: Secondary | ICD-10-CM

## 2017-02-08 NOTE — Telephone Encounter (Signed)
Message left on clinic nurse voice mail - patient calling to check on her ultrasound results.  Will route note to ordering physician (Dr. Dallas Schimke).  Burna Forts, BSN, RN-BC  e Marvel Plan, BSN, RN-BC

## 2017-02-09 NOTE — Telephone Encounter (Signed)
Called patient to report normal ultrasound.

## 2017-04-27 ENCOUNTER — Other Ambulatory Visit: Payer: Self-pay | Admitting: Family Medicine

## 2017-04-27 ENCOUNTER — Telehealth: Payer: Self-pay

## 2017-04-27 ENCOUNTER — Other Ambulatory Visit: Payer: Self-pay

## 2017-04-27 MED ORDER — HYDROCHLOROTHIAZIDE 25 MG PO TABS: 25.0000 mg | ORAL_TABLET | Freq: Every day | ORAL | 0 refills | Status: DC

## 2017-04-27 MED ORDER — AMLODIPINE BESYLATE 10 MG PO TABS: 10.0000 mg | ORAL_TABLET | Freq: Every day | ORAL | 0 refills | Status: DC

## 2017-04-27 NOTE — Telephone Encounter (Signed)
Pt called because she has been without her blood pressure medication for 4 days now. She said that the pharmacy has faxed [place]. Can we send in her Amlodipine and hydrochlorothiazide.jw

## 2017-04-27 NOTE — Telephone Encounter (Signed)
Called patient and informed her that her RX for amlodipine and hydrochlorothiazide was sent to pharmacy. Made a follow up appointment for 05/14/2017 at 0910 per instruction from Dr. Ardelia Mems.  Patient stated that she will need her LDL checked and other blood work done at that time.Ozella Almond, CMA

## 2017-04-27 NOTE — Telephone Encounter (Signed)
Will send in rx but please ask patient to schedule follow up with me. Thanks Leeanne Rio, MD

## 2017-05-14 ENCOUNTER — Ambulatory Visit: Payer: Medicaid Other | Admitting: Family Medicine

## 2017-05-14 ENCOUNTER — Encounter: Payer: Self-pay | Admitting: Family Medicine

## 2017-05-14 VITALS — BP 140/88 | HR 73 | Temp 98.3°F | Wt 233.2 lb

## 2017-05-14 DIAGNOSIS — I10 Essential (primary) hypertension: Secondary | ICD-10-CM | POA: Diagnosis not present

## 2017-05-14 DIAGNOSIS — E669 Obesity, unspecified: Secondary | ICD-10-CM | POA: Diagnosis not present

## 2017-05-14 DIAGNOSIS — Z114 Encounter for screening for human immunodeficiency virus [HIV]: Secondary | ICD-10-CM | POA: Diagnosis not present

## 2017-05-14 DIAGNOSIS — H538 Other visual disturbances: Secondary | ICD-10-CM | POA: Diagnosis not present

## 2017-05-14 DIAGNOSIS — E010 Iodine-deficiency related diffuse (endemic) goiter: Secondary | ICD-10-CM | POA: Diagnosis present

## 2017-05-14 MED ORDER — HYDROCHLOROTHIAZIDE 25 MG PO TABS: 25.0000 mg | ORAL_TABLET | Freq: Every day | ORAL | 3 refills | Status: DC

## 2017-05-14 MED ORDER — AMLODIPINE BESYLATE 10 MG PO TABS: 10.0000 mg | ORAL_TABLET | Freq: Every day | ORAL | 3 refills | Status: DC

## 2017-05-14 NOTE — Patient Instructions (Addendum)
It was great to see you again today!  I am referring you to an eye doctor for your vision issues. You will get a phone call to schedule this appointment.   Refilled medications  On your way out, schedule an appointment one morning to come back for fasting labs. Do not eat or drink anything other than water the morning of your lab appointment until after your labs are drawn.   Follow up with me in 3 months, sooner if needed  Be well, Dr. Ardelia Mems
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
## 2017-05-17 ENCOUNTER — Other Ambulatory Visit: Payer: Medicaid Other

## 2017-05-17 DIAGNOSIS — Z114 Encounter for screening for human immunodeficiency virus [HIV]: Secondary | ICD-10-CM

## 2017-05-17 DIAGNOSIS — E010 Iodine-deficiency related diffuse (endemic) goiter: Secondary | ICD-10-CM

## 2017-05-17 DIAGNOSIS — H538 Other visual disturbances: Secondary | ICD-10-CM | POA: Insufficient documentation

## 2017-05-17 DIAGNOSIS — E669 Obesity, unspecified: Secondary | ICD-10-CM

## 2017-05-17 NOTE — Assessment & Plan Note (Addendum)
-   Longstanding for over 1 year - Placed referral for optho - Likely that she may require glasses. - Visual acuity measurements obtained: B- 20/40, L- 20/100, R- 20/40 - No red flag symptoms at this time.

## 2017-05-17 NOTE — Assessment & Plan Note (Signed)
-   Patient inquiring over safety of getting HCG shots and taking appetite suppressant. Advised that we are unable to recommend or advise against these treatments and will defer to judgement of physician at bariatric clinic. - return for fasting labs - will also check TSH, free T4, T3, TPO antibody given ?thyromegaly on exam (though may just be due to anterior neck adiposity)

## 2017-05-17 NOTE — Assessment & Plan Note (Signed)
-   Repeat blood pressure within goal 140/88 - Blood pressure is well controlled when she is taking her medications, no changes indicated at this time. - return for fasting labs to check lipids & CMET

## 2017-05-18 LAB — HIV ANTIBODY (ROUTINE TESTING W REFLEX): HIV Screen 4th Generation wRfx: NONREACTIVE

## 2017-05-18 LAB — CMP14+EGFR
A/G RATIO: 1.1 — AB (ref 1.2–2.2)
ALT: 25 IU/L (ref 0–32)
AST: 15 IU/L (ref 0–40)
Albumin: 4.1 g/dL (ref 3.5–5.5)
Alkaline Phosphatase: 65 IU/L (ref 39–117)
BILIRUBIN TOTAL: 0.3 mg/dL (ref 0.0–1.2)
BUN/Creatinine Ratio: 15 (ref 9–23)
BUN: 13 mg/dL (ref 6–20)
CALCIUM: 9.3 mg/dL (ref 8.7–10.2)
CO2: 23 mmol/L (ref 20–29)
Chloride: 102 mmol/L (ref 96–106)
Creatinine, Ser: 0.87 mg/dL (ref 0.57–1.00)
GFR, EST AFRICAN AMERICAN: 99 mL/min/{1.73_m2} (ref >59)
GFR, EST NON AFRICAN AMERICAN: 86 mL/min/{1.73_m2} (ref >59)
GLOBULIN, TOTAL: 3.8 g/dL (ref 1.5–4.5)
Glucose: 84 mg/dL (ref 65–99)
POTASSIUM: 3.5 mmol/L (ref 3.5–5.2)
SODIUM: 143 mmol/L (ref 134–144)
TOTAL PROTEIN: 7.9 g/dL (ref 6.0–8.5)

## 2017-05-18 LAB — TSH: TSH: 4.73 u[IU]/mL — AB (ref 0.450–4.500)

## 2017-05-18 LAB — THYROID PEROXIDASE ANTIBODY: THYROID PEROXIDASE ANTIBODY: 8 [IU]/mL (ref 0–34)

## 2017-05-18 LAB — LIPID PANEL
CHOL/HDL RATIO: 4.9 ratio — AB (ref 0.0–4.4)
Cholesterol, Total: 178 mg/dL (ref 100–199)
HDL: 36 mg/dL — ABNORMAL LOW (ref >39)
LDL Calculated: 120 mg/dL — ABNORMAL HIGH (ref 0–99)
Triglycerides: 110 mg/dL (ref 0–149)
VLDL Cholesterol Cal: 22 mg/dL (ref 5–40)

## 2017-05-18 LAB — T4, FREE: FREE T4: 1.13 ng/dL (ref 0.82–1.77)

## 2017-05-18 LAB — T3: T3, Total: 151 ng/dL (ref 71–180)

## 2017-05-20 ENCOUNTER — Telehealth: Payer: Self-pay | Admitting: Family Medicine

## 2017-05-20 DIAGNOSIS — R7989 Other specified abnormal findings of blood chemistry: Secondary | ICD-10-CM

## 2017-05-20 NOTE — Telephone Encounter (Signed)
Would like results from [place] blood work

## 2017-05-20 NOTE — Telephone Encounter (Signed)
Called patient and discussed labs. All were normal except TSH, which was mildly elevated. Normal T4 and TPO antibodies. Gave options of trial of low dose levothyroxine for subclinical hypothyroidism, versus repeating labs in 1 month. After discussion we elected to repeat labs in 1 month. If TSH still elevated at that time will likely plan for thyroid replacement therapy.  Labs ordered & lab visit scheduled. Patient appreciative. Leeanne Rio, MD

## 2017-05-20 NOTE — Telephone Encounter (Signed)
Patient wants lab results.Ozella Almond, CMA

## 2017-06-17 ENCOUNTER — Other Ambulatory Visit: Payer: Medicaid Other

## 2017-06-17 DIAGNOSIS — R7989 Other specified abnormal findings of blood chemistry: Secondary | ICD-10-CM

## 2017-06-18 LAB — TSH: TSH: 5.19 u[IU]/mL — AB (ref 0.450–4.500)

## 2017-06-18 LAB — T4, FREE: Free T4: 1.17 ng/dL (ref 0.82–1.77)

## 2017-06-21 ENCOUNTER — Telehealth: Payer: Self-pay | Admitting: Family Medicine

## 2017-06-21 DIAGNOSIS — H33321 Round hole, right eye: Secondary | ICD-10-CM | POA: Diagnosis not present

## 2017-06-21 DIAGNOSIS — H4710 Unspecified papilledema: Secondary | ICD-10-CM | POA: Diagnosis not present

## 2017-06-21 DIAGNOSIS — E039 Hypothyroidism, unspecified: Secondary | ICD-10-CM

## 2017-06-21 DIAGNOSIS — E038 Other specified hypothyroidism: Secondary | ICD-10-CM | POA: Insufficient documentation

## 2017-06-21 MED ORDER — LEVOTHYROXINE SODIUM 50 MCG PO TABS: 50.0000 ug | ORAL_TABLET | Freq: Every day | ORAL | 1 refills | Status: DC

## 2017-06-21 NOTE — Telephone Encounter (Signed)
Went to [facility].  Dr said she had 4 holes in her ucranium ?Marland Kitchen  They will be doing a laser procedure on the eye on Monday 06-28-17.  MRI has been recommend for the swelling in the other eye. PT would like to talk to dr Ardelia Mems about her thyroid results.

## 2017-06-21 NOTE — Telephone Encounter (Signed)
Will forward to MD to advise. Kimber Fritts,CMA  

## 2017-06-21 NOTE — Telephone Encounter (Signed)
Returned call to patient. She had her ophtho appointment today and it sounds like she was dx'd with holes in her retina that are going to be repaired Monday with laser surgery. Patient was not clear on the details and did not know the terminology. They also noted what sounds like possible papilledema (again, unclear on details) and the ophthalmologist ordered and MRI and referred her to neurology. I will await records from the ophtho office visit.  Reviewed TSH with patient, which was again elevated. After discussion we elected to start low-dose synthroid to see if it helps her feel better (endorses dry skin, overweight, occasional fatigue). Advised to schedule follow up with me in 6 weeks to recheck TSH and check in on how she's doing.  Patient appreciative. Leeanne Rio, MD

## 2017-06-22 ENCOUNTER — Telehealth: Payer: Self-pay | Admitting: Neurology

## 2017-06-22 NOTE — Telephone Encounter (Addendum)
Pt called she was seen by Dr Marchase/[facility] today and dx with idiopathic intracranial hypertension. An appt was scheduled with Megan to r/u cerebral venous sinus thrombosis-pt said she is needing MRI.  FYI

## 2017-06-22 NOTE — Telephone Encounter (Signed)
appt has been r/s with Dr Krista Blue tomorrow at 3:00.  Sorry about the confusion.

## 2017-06-22 NOTE — Telephone Encounter (Signed)
Appointment pending w/ Dr. Krista Blue on 06/23/17.

## 2017-06-23 ENCOUNTER — Telehealth: Payer: Self-pay | Admitting: Neurology

## 2017-06-23 ENCOUNTER — Ambulatory Visit: Payer: Self-pay | Admitting: Adult Health

## 2017-06-23 ENCOUNTER — Encounter: Payer: Self-pay | Admitting: Neurology

## 2017-06-23 ENCOUNTER — Ambulatory Visit: Payer: Medicaid Other | Admitting: Neurology

## 2017-06-23 VITALS — BP 151/107 | HR 110 | Ht 63.0 in | Wt 234.0 lb

## 2017-06-23 DIAGNOSIS — G44309 Post-traumatic headache, unspecified, not intractable: Secondary | ICD-10-CM

## 2017-06-23 DIAGNOSIS — H538 Other visual disturbances: Secondary | ICD-10-CM | POA: Diagnosis not present

## 2017-06-23 MED ORDER — ALPRAZOLAM 1 MG PO TABS: 1.0000 mg | ORAL_TABLET | Freq: Every evening | ORAL | 0 refills | Status: DC | PRN

## 2017-06-23 NOTE — Telephone Encounter (Signed)
She needs Open MRI
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
## 2017-06-23 NOTE — Telephone Encounter (Signed)
Noted  

## 2017-06-25 ENCOUNTER — Telehealth: Payer: Self-pay | Admitting: Neurology

## 2017-06-25 NOTE — Telephone Encounter (Signed)
MRI & MRV are pending with Medicaid case # [id number]. I faxed clinical notes to a nurse reviewer.

## 2017-06-28 NOTE — Telephone Encounter (Signed)
I called [employer] to check the status. It is still pending.

## 2017-06-29 NOTE — Telephone Encounter (Signed)
Okay I have the approval for the MRI brain wo contrast Auth: [id number] (exp. 06/25/17 to 07/25/17) I will fax it to [facility] and they will reach out to the patient to schedule.

## 2017-06-29 NOTE — Telephone Encounter (Signed)
The MRI Brain has been approved but not the MRV Head. The MRV Head is the same CPT code as MRA Head. I did fax the clinical notes. The phone number for the peer to peer is [phone] option 4 & then option 1.

## 2017-06-29 NOTE — Telephone Encounter (Signed)
Ok to do MRI brain first, hold MRV for now

## 2017-07-06 MED ORDER — ALPRAZOLAM 1 MG PO TABS: ORAL_TABLET | ORAL | 0 refills | Status: DC

## 2017-07-06 NOTE — Telephone Encounter (Signed)
Patient stated she need something to help her with her claustrophobic for the MRI.

## 2017-07-06 NOTE — Telephone Encounter (Signed)
Per Dr. Krista Blue, ok to provide Xanax per MRI protocol.  Rx authorized and faxed to her pharmacy.  I called the patient back and left her a detailed message (ok per DPR - prescription sent - must have driver).  Provided our number to call back with any questions.

## 2017-07-06 NOTE — Addendum Note (Signed)
Addended by: Noberto Retort C on: 07/06/2017 10:21 AM   Modules accepted: Orders

## 2017-07-13 NOTE — Telephone Encounter (Signed)
Patient is scheduled at [facility] for 07/19/17.

## 2017-07-15 ENCOUNTER — Encounter: Payer: Self-pay | Admitting: Family Medicine

## 2017-07-16 NOTE — Progress Notes (Signed)
Patient found me in clinic yesterday 4/18 and reported she's been taking the levothyroxine daily for about a month. Two times she felt anxious going to bed. Takes medication in the AM. She asked if she should stop it. I advised that having anxiety just 2 times out of a whole month of taking it may not be due to the medication, and recommended she continue it. Encouraged her to follow up with me for labs.  Leeanne Rio, MD

## 2017-07-21 ENCOUNTER — Telehealth: Payer: Self-pay | Admitting: Neurology

## 2017-07-21 NOTE — Telephone Encounter (Signed)
Patient calling anxious about MRI results. I advised Dr. Krista Blue is out of the office but she wanted me to send a message to her nurse for a returned call.

## 2017-07-21 NOTE — Telephone Encounter (Addendum)
Dr. Felecia Shelling reviewed her printed MRI results showing no significant abnormalities.  Pt is aware and will keep her follow up on 08/26/17 for further review.  [facility]:  MRI brain without contrast  FINDINGS:  There is a single punctate focus of T2 hyperintensity in the inferior left frontal white matter, not likely significant.  Signal in the brain parenchyma is otherwise normal.  There is no acute infarct.  There is normal flow signal void in both internal carotid arteries and the basilar artery.  There is no hemorrhage.  There is no mass lesion.  There is no hydrocephalus.  The bone marrow signal is normal.  The paranasal sinuses are clear.  Extracranial soft tissues are normal.  IMPRESSION:  No significant abnormalities.

## 2017-07-26 ENCOUNTER — Telehealth: Payer: Self-pay | Admitting: Neurology

## 2017-07-26 NOTE — Telephone Encounter (Addendum)
Spoke to patient - says she was instructed to follow up with Dr. Krista Blue.  Her appt has been moved to an earlier date.  She will be seen on 07/28/17.  She is going to have Dr. Manuella Ghazi fax over to her exam notes from today.

## 2017-07-26 NOTE — Telephone Encounter (Signed)
Patient states today (07/26/17), she had an appointment with Dr. Brigitte Pulse, her eye doctor. She states that Dr. Brigitte Pulse informed her that she needed to go see Dr. Krista Blue for a lumbar puncture to rule out blood clot fluid behind her eye. Patient states that Dr. Brigitte Pulse told her that she still has some swelling behind her eye. Patient states she is very confused and would like for Dr. Rhea Belton RN to call her and discuss her further treatment.

## 2017-07-28 ENCOUNTER — Ambulatory Visit: Payer: Medicaid Other | Admitting: Neurology

## 2017-07-28 ENCOUNTER — Encounter: Payer: Self-pay | Admitting: Neurology

## 2017-07-28 VITALS — BP 155/102 | HR 107 | Ht 63.0 in | Wt 233.2 lb

## 2017-07-28 DIAGNOSIS — G932 Benign intracranial hypertension: Secondary | ICD-10-CM | POA: Diagnosis not present

## 2017-07-28 MED ORDER — TOPIRAMATE 100 MG PO TABS: 100.0000 mg | ORAL_TABLET | Freq: Two times a day (BID) | ORAL | 11 refills | Status: DC
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
## 2017-07-30 ENCOUNTER — Telehealth: Payer: Self-pay | Admitting: Neurology

## 2017-07-30 NOTE — Telephone Encounter (Signed)
She was concerned that Dr. Krista Blue thought she was having seizures because she prescribed Topamax.  She is now aware that this is not the case and that Topamax is being appropriately prescribed for her medical condition.  She appreciated the return call.

## 2017-07-30 NOTE — Telephone Encounter (Signed)
Pt called requesting a call back to discuss topiramate (TOPAMAX) 100 MG tablet. Pt did not want to go into more detail

## 2017-08-02 NOTE — Telephone Encounter (Addendum)
Since starting Topamax, 100mg  at bedtime, she has been drowsy the following day.  She is going to cut the dose back to 50mg  at bedtime and titrate slowly up to 100mg  twice daily.  She is aware that with each increase she may initially experiencing some drowsiness but the side effect should subside as her body gets acclimated to the new dose.  She is agreeable to continue with the medication.  She will call back with any further concerns.

## 2017-08-02 NOTE — Telephone Encounter (Signed)
Pt called stating she can not tolerate Topamax. Pt states she does not like the way it makes her feel "out of touch". Pt requesting a call back to discuss switching to a different medication.

## 2017-08-05 ENCOUNTER — Other Ambulatory Visit: Payer: Self-pay

## 2017-08-05 MED ORDER — HYDROCHLOROTHIAZIDE 25 MG PO TABS: 25.0000 mg | ORAL_TABLET | Freq: Every day | ORAL | 0 refills | Status: DC

## 2017-08-06 ENCOUNTER — Ambulatory Visit
Admission: RE | Admit: 2017-08-06 | Discharge: 2017-08-06 | Disposition: A | Discharge status: Home / Self Care | Payer: Medicaid Other | Source: Ambulatory Visit | Attending: Neurology | Admitting: Neurology

## 2017-08-06 ENCOUNTER — Telehealth: Payer: Self-pay | Admitting: Neurology

## 2017-08-06 VITALS — BP 140/99 | HR 79

## 2017-08-06 DIAGNOSIS — H538 Other visual disturbances: Secondary | ICD-10-CM

## 2017-08-06 DIAGNOSIS — G932 Benign intracranial hypertension: Secondary | ICD-10-CM

## 2017-08-06 NOTE — Telephone Encounter (Signed)
She says her opening pressure was 46 on lumbar puncture. She is doing well on 50mg  and is going to increase back to 100mg  in the evening.

## 2017-08-06 NOTE — Discharge Instructions (Signed)

## 2017-08-09 ENCOUNTER — Encounter (HOSPITAL_COMMUNITY): Payer: Self-pay | Admitting: Family Medicine

## 2017-08-09 ENCOUNTER — Ambulatory Visit (HOSPITAL_COMMUNITY)
Admission: EM | Admit: 2017-08-09 | Discharge: 2017-08-09 | Disposition: A | Discharge status: Home / Self Care | Payer: Medicaid Other | Attending: Family Medicine | Admitting: Family Medicine

## 2017-08-09 MED ORDER — BUTALBITAL-APAP-CAFFEINE 50-325-40 MG PO TABS: 1.0000 | ORAL_TABLET | Freq: Four times a day (QID) | ORAL | 0 refills | Status: DC | PRN

## 2017-08-09 NOTE — Telephone Encounter (Signed)
Please call and check on patient, lumbar puncture on Aug 06, 2017 showed opening pressure of 46 cm water, patient has complained of post LP headache over the past few days.   Reviewing the chart, patient is likely at the ED now,  Options are:  1.Fioricet as needed, increase water intake, bed rest 2. Or blood patch.

## 2017-08-09 NOTE — ED Triage Notes (Addendum)
Pt here for headache since Friday after a lumbar puncture. They did this for Increased ICP. She has been taking Topamax with no relief. She has contacted her neurologist and they haven't returned her call. She has been drinking lots of fluids and caffeine and resting as told.

## 2017-08-09 NOTE — Telephone Encounter (Signed)
Patient has had a headache since this past Saturday. She has taken Tylenol and topiramate (TOPAMAX) 100 MG tablet but has not helped. She says she needs to be seen today. Please call and discuss.

## 2017-08-09 NOTE — ED Notes (Signed)
Pt reports that her Neurilogist called her back and she is leaving and taking instructions from them.

## 2017-08-09 NOTE — Addendum Note (Signed)
Addended by: France Ravens I on: 08/09/2017 05:40 PM   Modules accepted: Orders

## 2017-08-09 NOTE — Telephone Encounter (Signed)
Per Dr. Cathren Laine note on Friday, "opening pressure was 46 on lumbar puncture. She is doing well on 50mg  and is going to increase back to 100mg  in the evening." Patient calling today reporting that she has had a headache since Saturday and wants to be seen, however, the only availability is with NP Janett Billow. Please advise.

## 2017-08-09 NOTE — Telephone Encounter (Signed)
Spoke with [place].  She c/o h/a onset Sat. night. that waxes and wanes. Better with lying down, but she has been able to do light activity today, such as laundry.  I have explained that LP h/a's are usually more severe than this, so that even light activity is unbearable.  She sts. she is increasing Topamax as rx'd, but is causing some drowsiness, making it more difficult to work and care for her 4 children.  She will slow the titration so that she has an extra wk. or so in between increases in doses, and may slow it even more if needed.  Dr. Krista Blue was agreeable with this.  For now, she will take Fioricet (faxed to [facility]), and hydration; call back if h/a does not improve.  She is at Urgent Care now and will let them know of this conversation and that Dr. Krista Blue has sent Fioricet to the pharmacy for her/fim

## 2017-08-11 ENCOUNTER — Telehealth: Payer: Self-pay | Admitting: Neurology

## 2017-08-11 NOTE — Telephone Encounter (Signed)
Patient called back and said that there is no need for you to call her she will just go to her PCP for this .

## 2017-08-11 NOTE — Telephone Encounter (Signed)
Noted. Patient will call back as needed.

## 2017-08-11 NOTE — Telephone Encounter (Signed)
Pt called when she woke up this morning the right side of her neck, ear and face was swollen. She is wanting to know could that have anything to do with LP? Please call to advise

## 2017-08-12 ENCOUNTER — Other Ambulatory Visit: Payer: Self-pay

## 2017-08-12 ENCOUNTER — Encounter: Payer: Self-pay | Admitting: Family Medicine

## 2017-08-12 ENCOUNTER — Ambulatory Visit: Payer: Medicaid Other | Admitting: Family Medicine

## 2017-08-12 VITALS — BP 128/84 | HR 84 | Temp 98.2°F | Wt 226.0 lb

## 2017-08-12 DIAGNOSIS — R220 Localized swelling, mass and lump, head: Secondary | ICD-10-CM

## 2017-08-12 HISTORY — DX: Localized swelling, mass and lump, head: R22.0

## 2017-08-12 NOTE — Progress Notes (Signed)
Subjective: Chief Complaint  Patient presents with  . Facial Swelling    HPI: Anna Anderson is a 37 y.o. presenting to clinic today to discuss the following:  Right sided facial swelling Patient states yesterday she noticed the right side of her face and neck were swollen. At her appointment today she does not exhibit any obvious facial asymmetry. She had a lumbar puncture the day before with no complications for symptomatic relief for Pseudotumor Cerebri and was started on Topomax. She denies any symptoms, no rash, no itching, no pain, no difficulty swallowing, talking, chewing, or breathing. She has had no fever, fatigue, and no swelling on any other part of her body. No heat or cold intolerance. Thyroid was checked recently (last month) and TSH is elevated but free T4 is within normal limits. No new exposures to soaps, shampoos, detergents, food, no recent travel.  She denies any vision changes, headaches, abdominal pain, vomiting, nausea, diarrhea, or constipation.  Health Maintenance: None     ROS noted in HPI.   Past Medical, Surgical, Social, and Family History Reviewed & Updated per EMR.   Pertinent Historical Findings include:   Social History   Tobacco Use  Smoking Status Never Smoker  Smokeless Tobacco Never Used    Objective: BP 128/84   Pulse 84   Temp 98.2 F (36.8 C) (Oral)   Wt 226 lb (102.5 kg)   LMP 08/06/2017 (Exact Date)   SpO2 95%   BMI 40.03 kg/m  Vitals and nursing notes reviewed  Physical Exam  Constitutional: She is oriented to person, place, and time. She appears well-developed and well-nourished. No distress.  HENT:  Head: Normocephalic and atraumatic.  Right Ear: External ear normal.  Left Ear: External ear normal.  Eyes: Pupils are equal, round, and reactive to light. Conjunctivae and EOM are normal.  Neck: Normal range of motion. No thyromegaly present.  Cardiovascular: Normal rate, regular rhythm, normal heart sounds and  intact distal pulses.  No murmur heard. Pulmonary/Chest: Effort normal and breath sounds normal. She has no wheezes. She has no rales. She exhibits no tenderness.  Abdominal: Soft. Bowel sounds are normal. She exhibits no distension. There is no tenderness.  Musculoskeletal: Normal range of motion. She exhibits no edema or tenderness.  Lymphadenopathy:    She has no cervical adenopathy.  Neurological: She is alert and oriented to person, place, and time. She displays normal reflexes. No cranial nerve deficit or sensory deficit. Coordination normal.  Skin: Skin is warm and dry. Capillary refill takes less than 2 seconds. No rash noted. No erythema.  Psychiatric: She has a normal mood and affect.    No results found for this or any previous visit (from the past 72 hour(s)).  Assessment/Plan:  Right facial swelling No obvious etiology due to lack of symptoms and transitory nature of swelling. Highly unlikely to be related to the LP. Could be a slight reaction to the Topomax but also unlikely. Offered patient reassurance that this is likely nothing serious or dangerous and if it occurs again to contact Neurologist who started her on Topomax and consider changing medications.   PATIENT EDUCATION PROVIDED: See AVS    Diagnosis and plan along with any newly prescribed medication(s) were discussed in detail with this patient today. The patient verbalized understanding and agreed with the plan. Patient advised if symptoms worsen return to clinic or ER.   Health Maintainance:   No orders of the defined types were placed in this encounter.  No orders of the defined types were placed in this encounter.    Harolyn Rutherford, DO 08/12/2017, 10:45 AM PGY-1, [facility]

## 2017-08-12 NOTE — Assessment & Plan Note (Signed)
No obvious etiology due to lack of symptoms and transitory nature of swelling. Highly unlikely to be related to the LP. Could be a slight reaction to the Topomax but also unlikely. Offered patient reassurance that this is likely nothing serious or dangerous and if it occurs again to contact Neurologist who started her on Topomax and consider changing medications.

## 2017-08-24 ENCOUNTER — Telehealth: Payer: Self-pay | Admitting: Family Medicine

## 2017-08-24 DIAGNOSIS — E039 Hypothyroidism, unspecified: Secondary | ICD-10-CM

## 2017-08-24 DIAGNOSIS — E038 Other specified hypothyroidism: Secondary | ICD-10-CM

## 2017-08-24 MED ORDER — LEVOTHYROXINE SODIUM 50 MCG PO TABS: 50.0000 ug | ORAL_TABLET | Freq: Every day | ORAL | 0 refills | Status: DC

## 2017-08-24 NOTE — Telephone Encounter (Signed)
I will refill but patient needs appointment to recheck labs. I will enter order and she can schedule a lab visit. Please let her know.  Thanks Leeanne Rio, MD

## 2017-08-24 NOTE — Telephone Encounter (Signed)
Pt is out of her thyroid medicine.  Can dr Ardelia Mems refill it or does she need an appt?  [facility] on [address].  Please advise

## 2017-08-24 NOTE — Telephone Encounter (Signed)
Pt informed and scheduled for an appt. Deseree Blount, CMA  

## 2017-08-25 ENCOUNTER — Other Ambulatory Visit: Payer: Medicaid Other

## 2017-08-25 DIAGNOSIS — E038 Other specified hypothyroidism: Secondary | ICD-10-CM

## 2017-08-25 DIAGNOSIS — E039 Hypothyroidism, unspecified: Secondary | ICD-10-CM

## 2017-08-25 NOTE — Progress Notes (Signed)
LABS

## 2017-08-26 ENCOUNTER — Ambulatory Visit: Payer: Medicaid Other | Admitting: Neurology

## 2017-08-26 LAB — TSH: TSH: 1.81 u[IU]/mL (ref 0.450–4.500)

## 2017-08-30 ENCOUNTER — Telehealth: Payer: Self-pay | Admitting: *Deleted

## 2017-08-30 NOTE — Telephone Encounter (Signed)
Pt informed and she said she will call back to make a follow up visit. Anna Anderson Kennon Holter, CMA

## 2017-08-30 NOTE — Telephone Encounter (Signed)
-----   Message from Leeanne Rio, MD sent at 08/27/2017  4:26 PM EDT ----- Please let patient know that thyroid test is now normal. She can stay on the medication. I would like her to schedule a follow up visit with me just to see how she's doing. Thanks!

## 2017-09-03 LAB — CSF CELL COUNT WITH DIFFERENTIAL
RBC Count, CSF: 0 cells/uL (ref 0–10)
WBC CSF: 1 {cells}/uL (ref 0–5)

## 2017-09-03 LAB — GRAM STAIN
GRAM STAIN: NONE SEEN
MICRO NUMBER:: 90572987
SPECIMEN QUALITY:: ADEQUATE

## 2017-09-03 LAB — VDRL, CSF: VDRL Quant, CSF: NONREACTIVE

## 2017-09-03 LAB — FUNGUS CULTURE W SMEAR
MICRO NUMBER: 90572988
SMEAR: NONE SEEN
SPECIMEN QUALITY: ADEQUATE

## 2017-09-03 LAB — GLUCOSE, CSF: Glucose, CSF: 61 mg/dL (ref 40–80)

## 2017-09-03 LAB — PROTEIN, CSF: TOTAL PROTEIN, CSF: 27 mg/dL (ref 15–45)

## 2017-10-08 ENCOUNTER — Other Ambulatory Visit: Payer: Self-pay | Admitting: Family Medicine

## 2017-10-08 ENCOUNTER — Telehealth: Payer: Self-pay | Admitting: Neurology

## 2017-10-08 NOTE — Telephone Encounter (Signed)
Pt is completely out of her thyroid medication. She has an appt to see dr Ardelia Mems July 23. She would like to get enough medication to last her until then. Please advise.  [facility] on [address]

## 2017-10-08 NOTE — Telephone Encounter (Signed)
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

## 2017-10-11 MED ORDER — LEVOTHYROXINE SODIUM 50 MCG PO TABS: 50.0000 ug | ORAL_TABLET | Freq: Every day | ORAL | 0 refills | Status: DC

## 2017-10-11 NOTE — Telephone Encounter (Signed)
Pt informed. Deseree Blount, CMA  

## 2017-10-11 NOTE — Telephone Encounter (Signed)
Rx sent, please let patient know, thanks! Leeanne Rio, MD

## 2017-10-19 ENCOUNTER — Ambulatory Visit: Payer: Self-pay | Admitting: Family Medicine
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
## 2017-10-22 ENCOUNTER — Ambulatory Visit (INDEPENDENT_AMBULATORY_CARE_PROVIDER_SITE_OTHER): Payer: Medicaid Other | Admitting: Family Medicine

## 2017-10-22 ENCOUNTER — Encounter: Payer: Self-pay | Admitting: Family Medicine

## 2017-10-22 ENCOUNTER — Other Ambulatory Visit: Payer: Self-pay

## 2017-10-22 VITALS — BP 120/82 | HR 67 | Temp 98.5°F | Ht 63.0 in | Wt 207.4 lb

## 2017-10-22 DIAGNOSIS — G932 Benign intracranial hypertension: Secondary | ICD-10-CM | POA: Diagnosis not present

## 2017-10-22 DIAGNOSIS — E038 Other specified hypothyroidism: Secondary | ICD-10-CM

## 2017-10-22 DIAGNOSIS — E039 Hypothyroidism, unspecified: Secondary | ICD-10-CM | POA: Diagnosis not present

## 2017-10-22 DIAGNOSIS — K5900 Constipation, unspecified: Secondary | ICD-10-CM | POA: Diagnosis not present

## 2017-10-22 MED ORDER — POLYETHYLENE GLYCOL 3350 17 G PO PACK: 17.0000 g | PACK | Freq: Every day | ORAL | 2 refills | Status: DC | PRN

## 2017-10-22 NOTE — Progress Notes (Signed)
Date of Visit: 10/22/2017   HPI:  Patient presents for routine follow up.  Subclinical hypothyroidism - currently taking levothryoxine 48mcg daily. Tolerating well. Feels much better while on this medication. Notices she feels worse if she misses a dose. Has more energy. TSH was 1.81 on 08/25/17.  Pseudotumor - continues to see ophthalmology and neurology (Dr. Evelena Leyden). Taking topamax. Overall feeling better. Has lots of questions about pseudotumor dx, advised her to discuss with Dr. Evelena Leyden.   Constipation - has small bowel movement every day but subjectively patient feels like she should be having larger bowel movement. Has changed diet and is eating healthier to lose weight. No blood in stool. No abdominal pain.  ROS: See HPI.  [place]: history of pseudotumor, subclinical hypothyroidism, hypertension, obesity, GAD, BPPV, depression, PCOS  PHYSICAL EXAM: BP 120/82   Pulse 67   Temp 98.5 F (36.9 C) (Oral)   Ht 5\' 3"  (1.6 m)   Wt 207 lb 6.4 oz (94.1 kg)   SpO2 98%   BMI 36.74 kg/m  Gen: no acute distress, pleasant, cooperative, well appearing HEENT: normocephalic, atraumatic, moist mucous membranes. Mild thyromegaly vs excess adipose tissue on anterior neck. No thyroid masses palpable. Heart: regular rate and rhythm, no murmur Lungs: clear to auscultation bilaterally, normal work of breathing Abdomen: soft, nontender to palpation  Neuro: alert, grossly nonfocal, speech normal  ASSESSMENT/PLAN:  Health maintenance:  - patient reports being UTD on pap smear, will request records from Dr. Roe Rutherford office  Pseudotumor cerebri Following regularly with ophtho and neurology.  Subclinical hypothyroidism Doing well, TSH improved on low dose levothyroxine. As patient feels better will continue this medication.   Constipation May be constipation vs just change in bowel habits from dietary changes. rx for miralax sent in for patient to try.  FOLLOW UP: Follow up in 4 mos for above  issues  [place] J. Ardelia Mems, [place]

## 2017-10-22 NOTE — Patient Instructions (Signed)
Sent in miralax for you Stay on current dose of levothyroxine and blood pressure medications   Follow up with me in 4 months, sooner if needed  [place] job with the weight loss!  Be well, Dr. Ardelia Mems

## 2017-10-25 ENCOUNTER — Encounter: Payer: Self-pay | Admitting: Nurse Practitioner

## 2017-10-25 ENCOUNTER — Ambulatory Visit: Payer: Medicaid Other | Admitting: Nurse Practitioner

## 2017-10-25 VITALS — BP 128/91 | HR 92 | Ht 63.0 in | Wt 206.6 lb

## 2017-10-25 DIAGNOSIS — G932 Benign intracranial hypertension: Secondary | ICD-10-CM | POA: Diagnosis not present

## 2017-10-25 DIAGNOSIS — F0781 Postconcussional syndrome: Secondary | ICD-10-CM

## 2017-10-25 DIAGNOSIS — K5900 Constipation, unspecified: Secondary | ICD-10-CM

## 2017-10-25 HISTORY — DX: Constipation, unspecified: K59.00

## 2017-10-25 NOTE — Patient Instructions (Signed)
Continue Topamax 100 mg twice daily Continue follow-up with ophthalmology F/U in 4 months

## 2017-10-25 NOTE — Assessment & Plan Note (Signed)
Following regularly with ophtho and neurology.

## 2017-10-25 NOTE — Assessment & Plan Note (Signed)
Doing well, TSH improved on low dose levothyroxine. As patient feels better will continue this medication.

## 2017-10-25 NOTE — Assessment & Plan Note (Signed)
May be constipation vs just change in bowel habits from dietary changes. rx for miralax sent in for patient to try.

## 2017-11-03 ENCOUNTER — Ambulatory Visit: Payer: Medicaid Other | Admitting: Nurse Practitioner

## 2017-11-10 ENCOUNTER — Other Ambulatory Visit: Payer: Self-pay | Admitting: Family Medicine

## 2017-11-18 DIAGNOSIS — G932 Benign intracranial hypertension: Secondary | ICD-10-CM | POA: Diagnosis not present

## 2018-02-16 ENCOUNTER — Other Ambulatory Visit: Payer: Self-pay | Admitting: Family Medicine
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
## 2018-03-01 ENCOUNTER — Ambulatory Visit: Payer: Medicaid Other | Admitting: Nurse Practitioner

## 2018-03-02 ENCOUNTER — Encounter: Payer: Self-pay | Admitting: Nurse Practitioner

## 2018-03-07 ENCOUNTER — Inpatient Hospital Stay (HOSPITAL_COMMUNITY)
Admission: AD | Admit: 2018-03-07 | Discharge: 2018-03-07 | Disposition: A | Discharge status: Home / Self Care | Payer: Medicaid Other | Attending: Obstetrics and Gynecology | Admitting: Obstetrics and Gynecology

## 2018-03-07 ENCOUNTER — Other Ambulatory Visit: Payer: Self-pay

## 2018-03-07 DIAGNOSIS — Z113 Encounter for screening for infections with a predominantly sexual mode of transmission: Secondary | ICD-10-CM

## 2018-03-07 DIAGNOSIS — Z880 Allergy status to penicillin: Secondary | ICD-10-CM | POA: Insufficient documentation

## 2018-03-07 DIAGNOSIS — N898 Other specified noninflammatory disorders of vagina: Secondary | ICD-10-CM | POA: Insufficient documentation

## 2018-03-07 LAB — POCT PREGNANCY, URINE: Preg Test, Ur: NEGATIVE

## 2018-03-07 LAB — URINALYSIS, ROUTINE W REFLEX MICROSCOPIC
Bilirubin Urine: NEGATIVE
GLUCOSE, UA: NEGATIVE mg/dL
HGB URINE DIPSTICK: NEGATIVE
KETONES UR: NEGATIVE mg/dL
Nitrite: NEGATIVE
PH: 7 (ref 5.0–8.0)
PROTEIN: NEGATIVE mg/dL
Specific Gravity, Urine: 1.016 (ref 1.005–1.030)

## 2018-03-07 LAB — WET PREP, GENITAL
Clue Cells Wet Prep HPF POC: NONE SEEN
Sperm: NONE SEEN
Trich, Wet Prep: NONE SEEN
Yeast Wet Prep HPF POC: NONE SEEN

## 2018-03-07 NOTE — MAU Note (Signed)
Pt presents to MAU with complaints of having a foul odor to her discharge and thinks she has a yeast infection. Boyfriend told her he has Chlamydia

## 2018-03-07 NOTE — MAU Provider Note (Addendum)
History     CSN: [id number]  Arrival date and time: 03/07/18 1110   Chief Complaint  Patient presents with  . vaginal pressure  . STD evaluation   37 y.o. female here with external vaginal irritation and itching  x1 week. Reports white thick vaginal discharge with malodor. She used monistat and a douche but it didn't help. She was told 1 week ago by her partner that he may have Chlamydia. No lumps, bumps, or lesions. She also used a new lubricating jelly recently. Feels burning of skin with urination. No hematuria, urgency, frequency. No new partner. Does not use condoms.  OB History   None     Past Medical History:  Diagnosis Date  . Concussion   . Hypertension     Past Surgical History:  Procedure Laterality Date  . CESAREAN SECTION     x3  . TUBAL LIGATION      Family History  Problem Relation Age of Onset  . Diabetes Mother   . Heart disease Mother   . Healthy Father     Social History   Tobacco Use  . Smoking status: Never Smoker  . Smokeless tobacco: Never Used  Substance Use Topics  . Alcohol use: No  . Drug use: No    Allergies:  Allergies  Allergen Reactions  . Amoxicillin Swelling  . Lisinopril Cough    Medications Prior to Admission  Medication Sig Dispense Refill Last Dose  . amLODipine (NORVASC) 10 MG tablet TAKE 1 TABLET BY MOUTH ONCE DAILY 90 tablet 3   . hydrochlorothiazide (HYDRODIURIL) 25 MG tablet TAKE 1 TABLET BY MOUTH ONCE DAILY 90 tablet 3   . levothyroxine (SYNTHROID, LEVOTHROID) 50 MCG tablet TAKE 1 TABLET(50 MCG) BY MOUTH DAILY 90 tablet 3   . polyethylene glycol (MIRALAX / GLYCOLAX) packet Take 17 g by mouth daily as needed. 30 each 2 Taking  . topiramate (TOPAMAX) 100 MG tablet Take 1 tablet (100 mg total) by mouth 2 (two) times daily. 60 tablet 11 Taking    Review of Systems  Gastrointestinal: Negative for abdominal pain.  Genitourinary: Positive for vaginal discharge.   Physical Exam   Blood pressure (!) 146/97,  pulse 77, temperature 97.9 F (36.6 C), resp. rate 16, height 5\' 1"  (1.549 m), weight 86.6 kg, last menstrual period 02/27/2018.  Physical Exam  Constitutional: She is oriented to person, place, and time. She appears well-developed and well-nourished. No distress.  HENT:  Head: Normocephalic and atraumatic.  Neck: Normal range of motion.  Cardiovascular: Normal rate.  Respiratory: Effort normal.  Musculoskeletal: Normal range of motion.  Neurological: She is alert and oriented to person, place, and time.  Psychiatric: She has a normal mood and affect.   Results for orders placed or performed during the hospital encounter of 03/07/18 (from the past 24 hour(s))  Urinalysis, Routine w reflex microscopic     Status: Abnormal   Collection Time: 03/07/18 12:00 PM  Result Value Ref Range   Color, Urine YELLOW YELLOW   APPearance CLEAR CLEAR   Specific Gravity, Urine 1.016 1.005 - 1.030   pH 7.0 5.0 - 8.0   Glucose, UA NEGATIVE NEGATIVE mg/dL   Hgb urine dipstick NEGATIVE NEGATIVE   Bilirubin Urine NEGATIVE NEGATIVE   Ketones, ur NEGATIVE NEGATIVE mg/dL   Protein, ur NEGATIVE NEGATIVE mg/dL   Nitrite NEGATIVE NEGATIVE   Leukocytes, UA TRACE (A) NEGATIVE   RBC / HPF 0-5 0 - 5 RBC/hpf   WBC, UA 0-5 0 - 5 WBC/hpf  Bacteria, UA RARE (A) NONE SEEN   Squamous Epithelial / LPF 0-5 0 - 5   Mucus PRESENT   Wet prep, genital     Status: Abnormal   Collection Time: 03/07/18 12:00 PM  Result Value Ref Range   Yeast Wet Prep HPF POC NONE SEEN NONE SEEN   Trich, Wet Prep NONE SEEN NONE SEEN   Clue Cells Wet Prep HPF POC NONE SEEN NONE SEEN   WBC, Wet Prep HPF POC FEW (A) NONE SEEN   Sperm NONE SEEN   Pregnancy, urine POC     Status: None   Collection Time: 03/07/18 12:00 PM  Result Value Ref Range   Preg Test, Ur NEGATIVE NEGATIVE   MAU Course  Procedures  MDM Labs ordered and reviewed. No evidence of UTI, yeast, BV, or trich. GC pending, declined full STD panel. Offered empiric  treatment for Chlamydia but pt declined. Recommend tub soaks with baking soda and condoms. Do not douche. Stable for discharge home   Assessment and Plan   1. Vaginal irritation   2. Screen for STD (sexually transmitted disease)    Discharge home Follow up with Dr. Melba Coon prn Tubs soaks bid  Allergies as of 03/07/2018      Reactions   Amoxicillin Swelling   Lisinopril Cough      Medication List    TAKE these medications   amLODipine 10 MG tablet Commonly known as:  NORVASC TAKE 1 TABLET BY MOUTH ONCE DAILY   hydrochlorothiazide 25 MG tablet Commonly known as:  HYDRODIURIL TAKE 1 TABLET BY MOUTH ONCE DAILY   levothyroxine 50 MCG tablet Commonly known as:  SYNTHROID, LEVOTHROID TAKE 1 TABLET(50 MCG) BY MOUTH DAILY   polyethylene glycol packet Commonly known as:  MIRALAX / GLYCOLAX Take 17 g by mouth daily as needed.   topiramate 100 MG tablet Commonly known as:  TOPAMAX Take 1 tablet (100 mg total) by mouth 2 (two) times daily.      Julianne Handler, CNM 03/07/2018, 1:28 PM

## 2018-03-07 NOTE — Discharge Instructions (Signed)

## 2018-03-09 LAB — GC/CHLAMYDIA PROBE AMP (~~LOC~~) NOT AT ARMC
Chlamydia: NEGATIVE
Neisseria Gonorrhea: NEGATIVE

## 2018-04-22 ENCOUNTER — Encounter: Payer: Self-pay | Admitting: Family Medicine

## 2018-06-16 ENCOUNTER — Telehealth: Payer: Self-pay | Admitting: Neurology

## 2018-06-16 MED ORDER — TOPIRAMATE 100 MG PO TABS: 100.0000 mg | ORAL_TABLET | Freq: Two times a day (BID) | ORAL | 1 refills | Status: DC

## 2018-06-16 NOTE — Addendum Note (Signed)
Addended by: Noberto Retort C on: 06/16/2018 01:59 PM   Modules accepted: Orders

## 2018-06-16 NOTE — Telephone Encounter (Signed)
Patient calling for a refill of Topamax to be called in to [facility].

## 2018-06-16 NOTE — Telephone Encounter (Signed)
The patient is past due for her follow up.  I called her and she was happy to schedule an appt.  She will come in on 08/11/2018.  Refills sent to the pharmacy to last her to this time.

## 2018-07-28 DIAGNOSIS — G932 Benign intracranial hypertension: Secondary | ICD-10-CM | POA: Diagnosis not present

## 2018-08-09 ENCOUNTER — Encounter: Payer: Self-pay | Admitting: Neurology

## 2018-08-09 ENCOUNTER — Ambulatory Visit (INDEPENDENT_AMBULATORY_CARE_PROVIDER_SITE_OTHER): Payer: Medicaid Other | Admitting: Neurology

## 2018-08-09 ENCOUNTER — Other Ambulatory Visit: Payer: Self-pay

## 2018-08-09 DIAGNOSIS — G932 Benign intracranial hypertension: Secondary | ICD-10-CM
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
## 2018-08-11 ENCOUNTER — Ambulatory Visit: Payer: Self-pay | Admitting: Neurology

## 2018-08-15 ENCOUNTER — Other Ambulatory Visit: Payer: Self-pay

## 2018-08-15 ENCOUNTER — Telehealth (INDEPENDENT_AMBULATORY_CARE_PROVIDER_SITE_OTHER): Payer: Medicaid Other | Admitting: Family Medicine

## 2018-08-15 DIAGNOSIS — E039 Hypothyroidism, unspecified: Secondary | ICD-10-CM | POA: Diagnosis not present

## 2018-08-15 DIAGNOSIS — I10 Essential (primary) hypertension: Secondary | ICD-10-CM

## 2018-08-15 DIAGNOSIS — E038 Other specified hypothyroidism: Secondary | ICD-10-CM

## 2018-08-15 DIAGNOSIS — G932 Benign intracranial hypertension: Secondary | ICD-10-CM

## 2018-08-15 NOTE — Assessment & Plan Note (Signed)
No recent home blood pressure readings. Advised patient of goal SBP 110-139 and goal DBP of 60-89. She will check blood pressure at work and let us know if pressures are outside of these ranges. Follow up in 3 months, will check labs at that time (delaying labs in light of COVID pandemic).

## 2018-08-15 NOTE — Assessment & Plan Note (Signed)
Doing well, weaning off topamax, has follow up in place with ophtho & neuro.

## 2018-08-15 NOTE — Progress Notes (Signed)
[facility] Telemedicine Visit  Patient consented to have virtual visit. Method of visit: Video was attempted, but technology challenges prevented patient from using video, so visit was conducted via telephone.  Encounter participants: Patient: Anna Anderson - located at home Provider: Chrisandra Netters - located at office Others (if applicable): n/a  Chief Complaint: follow up  HPI:  Pseudotumor - has followed up recently with her eye doctor and her neurologist. Eye doctor told her that swelling around her eyes has gone down. Neuro is weaning her down off topamax, which patient is excited about. Patient will be following back up with eye doctor in the next month or two to check on swelling behind eye again.  Hypertension - currently taking amlodipine 10mg  daily and HCTZ 25mg  daily. Anderson chest pain or shortness of breath. 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  Subclinical hypothyroidism - taking levothyroxine 30mcg daily, tolerating well. Last TSH check over 1 year ago.  ROS: per HPI  Pertinent PMHx: history of PCOS, BPPV, GAD, pseudotumor, obesity  Exam:  Respiratory: Patient speaking normally in full sentences throughout the encounter, without any respiratory distress evident.   Assessment/Plan:  Pseudotumor cerebri Doing well, weaning off topamax, has follow up in place with ophtho & neuro.  Primary hypertension Anderson recent home blood pressure readings. Advised patient of goal SBP 110-139 and goal DBP of 60-89. She will check blood pressure at work and let us know if pressures are outside of these ranges. Follow up in 3 months, will check labs at that time (delaying labs in light of COVID pandemic).  Subclinical hypothyroidism Continue present levothyroxine dose. Check TSH in 3 months at follow up visit.    Time spent during visit with patient: 11 minutes

## 2018-08-15 NOTE — Assessment & Plan Note (Signed)
Continue present levothyroxine dose. Check TSH in 3 months at follow up visit.

## 2018-08-19 ENCOUNTER — Telehealth: Payer: Medicaid Other | Admitting: Family Medicine

## 2018-10-03 IMAGING — DX DG CHEST 2V
2 series · 2 of 2 positions shown · non-contrast
Comparison: Chest radiograph performed 07/03/2016

CLINICAL DATA: Subacute onset of central chest pain. Dizziness and
blurred vision. Initial encounter.

EXAM:
CHEST  2 VIEW

[chest pa]
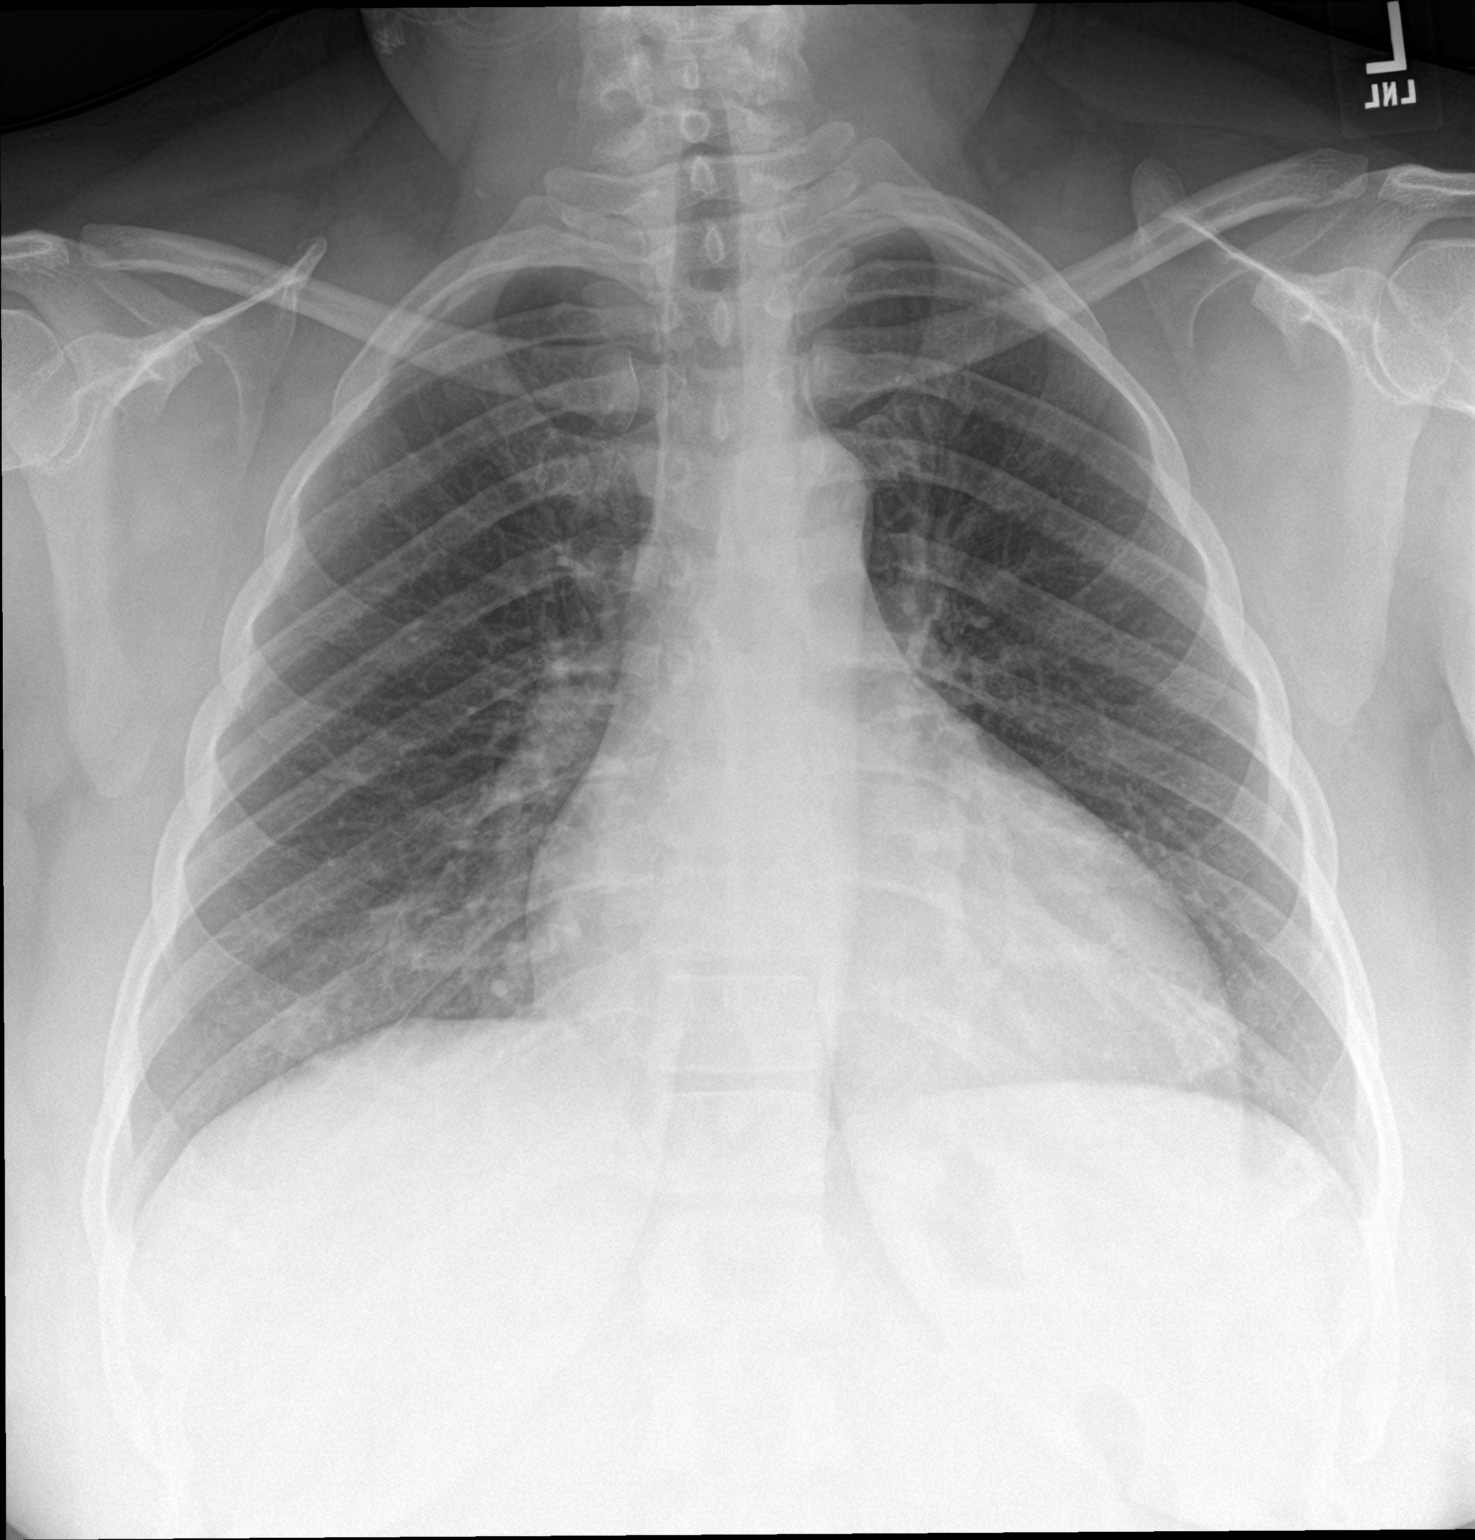

[chest lat]
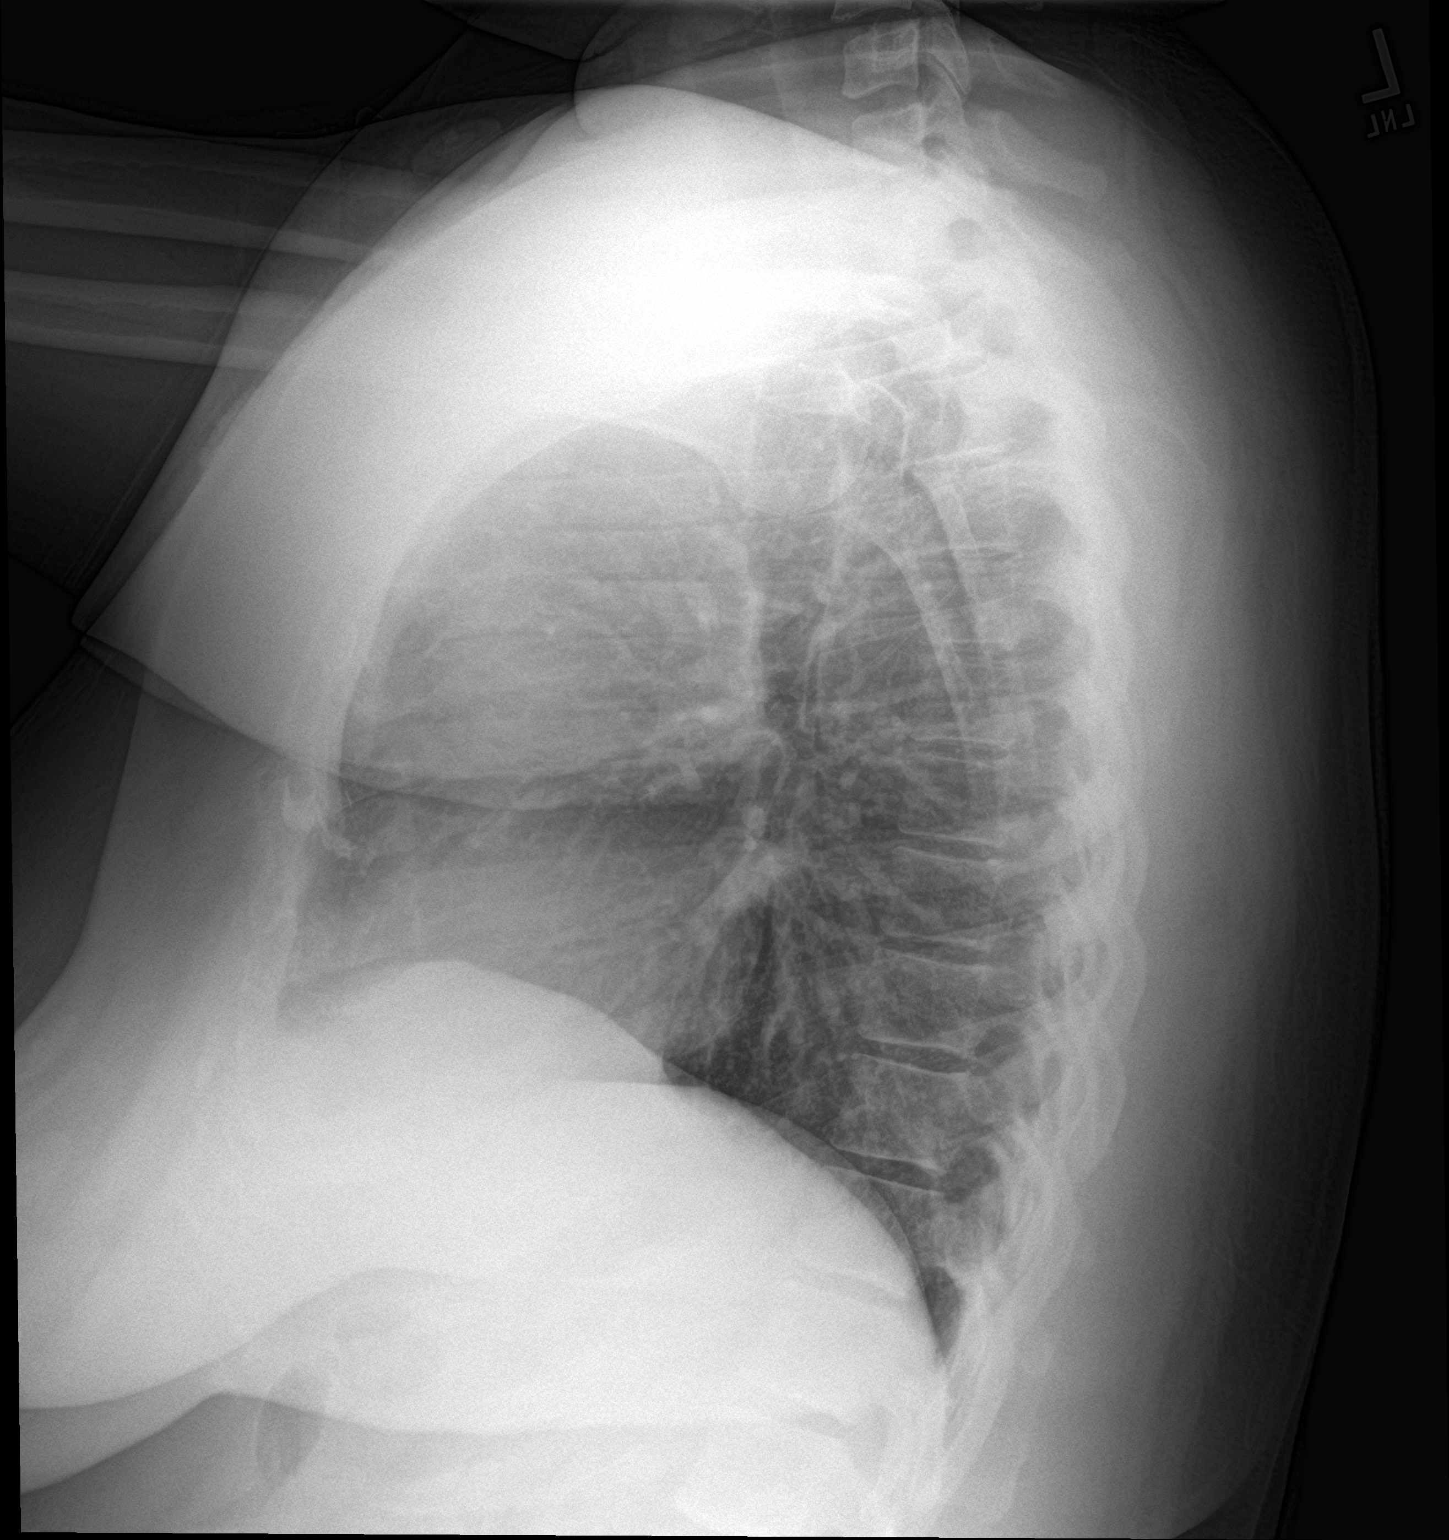

[2 of 2 positions shown; findings below may reference images not displayed]

FINDINGS: The lungs are well-aerated and clear. There is no evidence of focal
opacification, pleural effusion or pneumothorax.

The heart is mildly enlarged. No acute osseous abnormalities are
seen.
IMPRESSION: Mild cardiomegaly.  Lungs remain grossly clear.

## 2018-10-20 ENCOUNTER — Encounter: Payer: Self-pay | Admitting: Family Medicine

## 2018-10-20 ENCOUNTER — Other Ambulatory Visit: Payer: Self-pay

## 2018-10-20 ENCOUNTER — Ambulatory Visit (HOSPITAL_COMMUNITY)
Admission: RE | Admit: 2018-10-20 | Discharge: 2018-10-20 | Disposition: A | Discharge status: Home / Self Care | Payer: Medicaid Other | Source: Ambulatory Visit | Attending: Family Medicine | Admitting: Family Medicine

## 2018-10-20 ENCOUNTER — Ambulatory Visit (INDEPENDENT_AMBULATORY_CARE_PROVIDER_SITE_OTHER): Payer: Medicaid Other | Admitting: Family Medicine

## 2018-10-20 VITALS — BP 120/82 | HR 94 | Ht 63.0 in | Wt 218.4 lb

## 2018-10-20 DIAGNOSIS — R109 Unspecified abdominal pain: Secondary | ICD-10-CM | POA: Insufficient documentation

## 2018-10-20 DIAGNOSIS — D252 Subserosal leiomyoma of uterus: Secondary | ICD-10-CM | POA: Diagnosis not present

## 2018-10-20 DIAGNOSIS — R1032 Left lower quadrant pain: Secondary | ICD-10-CM

## 2018-10-20 DIAGNOSIS — D251 Intramural leiomyoma of uterus: Secondary | ICD-10-CM | POA: Diagnosis not present

## 2018-10-20 DIAGNOSIS — R110 Nausea: Secondary | ICD-10-CM | POA: Diagnosis not present

## 2018-10-20 HISTORY — DX: Left lower quadrant pain: R10.32

## 2018-10-20 LAB — POCT URINALYSIS DIP (MANUAL ENTRY)
Blood, UA: NEGATIVE
Glucose, UA: NEGATIVE mg/dL
Ketones, POC UA: NEGATIVE mg/dL
Leukocytes, UA: NEGATIVE
Nitrite, UA: NEGATIVE
Protein Ur, POC: 30 mg/dL — AB
Spec Grav, UA: 1.025 (ref 1.010–1.025)
Urobilinogen, UA: 1 E.U./dL
pH, UA: 6.5 (ref 5.0–8.0)

## 2018-10-20 LAB — POCT URINE PREGNANCY: Preg Test, Ur: NEGATIVE

## 2018-10-20 MED ORDER — PROMETHAZINE HCL 12.5 MG PO TABS: 12.5000 mg | ORAL_TABLET | Freq: Three times a day (TID) | ORAL | 0 refills | Status: DC | PRN

## 2018-10-20 NOTE — Patient Instructions (Signed)
I'm sorry that you are having so much stomach pain.  I think that we can rule out a lot of possibilities just based on your story.  I would like to get an ultrasound of your ovaries to ensure that you don't have a twisted ovary or a large cyst.    I don't think that you need to go to the ED and I think that this will get better with time.  For now, you are safe to stay home unless you get worse or if new symptoms come up.  If you begin to feel feverish and have increasing nausea with vomiting, I think you should be seen in the ED.   Continue Tylenol and motrin for your pain.

## 2018-10-20 NOTE — Assessment & Plan Note (Addendum)
Acute left lower abdominal pain, cramp-like sensation in addition to sharp pain.  Pregnancy test negative, s/p tubal ligation.  Low suspicion for kidney stone based on lack of blood in UA.  Low suspicion for UTI based on lack of nitrites and leukocytes on UA.  No history of diverticulosis/diverticulitis.  Positive history of PCOS, concern for ovarian torsion, ovarian cyst, ovarian cyst rupture.  This is potentially a worsening of her chronic painful menstruation though she notes this feels different than usual.  PID is also possible though she reports no vaginal discharge, no pain with intercourse, no fever. -Continue Tylenol and Motrin for pain control -Phenergan for nausea (12.5 mg every morning) use sparingly due to prolonged QTC -Follow-up transvaginal ultrasound to assess ovaries -Present to ED for worsening symptoms, fever, vomiting, bloody stool

## 2018-10-20 NOTE — Progress Notes (Signed)
Subjective:  Anna Anderson is a 38 y.o. female who presents to the [facility] today with a chief complaint of left lower abdominal pain.   HPI: Left lower quadrant abdominal pain Ms. Doyel's presented to clinic today with 16 hours of abdominal pain.  She reports that she first started to feel severe abdominal pain at about 6:00 last night.  Shortly after she returned home from work, she noticed severe cramping like sensation in her left lower quadrant.  This cramping sensation later turned to a sharp pain 10/10 in severity.  She tried taking Advil for the pain without significant relief.  In addition to the abdominal pain, she has been experiencing mild nausea.  She has previously experienced significant pain with menstruation but this feels different than her typical menstrual pain.   She did have a dinner of grilled chicken and salad which did not significantly affect her abdominal pain.  She reports normal bowel movements 1-2 times daily.  She reports urinating frequently overnight (getting up as many as 4 times in the night). She is sexually active.  ROS: She denies fever, decreased energy, cough, congestion, shortness of breath, chest pain, palpitations, dysuria, diarrhea, constipation, no vaginal discharge, no pain with intercourse.  Chief Complaint noted PMH - Smoking status noted.    Objective:  Physical Exam: BP 120/82   Pulse 94   Ht 5\' 3"  (1.6 m)   Wt 218 lb 6 oz (99.1 kg)   LMP 09/27/2018   SpO2 98%   BMI 38.68 kg/m    Gen: Leaning over the exam table in moderate pain throughout the interview. CV: RRR with no murmurs appreciated Pulm: NWOB, CTAB with no crackles, wheezes, or rhonchi GI: Hypoactive bowel sounds, soft, tender to superficial palpation in left lower quadrant, tender to deep palpation in remaining quadrants.  No CVA tenderness.  Results for orders placed or performed in visit on 10/20/18 (from the past 72 hour(s))  POCT urinalysis dipstick     Status:  Abnormal   Collection Time: 10/20/18 10:15 AM  Result Value Ref Range   Color, UA yellow yellow   Clarity, UA clear clear   Glucose, UA negative negative mg/dL   Bilirubin, UA small (A) negative   Ketones, POC UA negative negative mg/dL   Spec Grav, UA 1.025 1.010 - 1.025   Blood, UA negative negative   pH, UA 6.5 5.0 - 8.0   Protein Ur, POC =30 (A) negative mg/dL   Urobilinogen, UA 1.0 0.2 or 1.0 E.U./dL   Nitrite, UA Negative Negative   Leukocytes, UA Negative Negative  POCT urine pregnancy     Status: None   Collection Time: 10/20/18 10:15 AM  Result Value Ref Range   Preg Test, Ur Negative Negative    Assessment/Plan:  LLQ pain Acute left lower abdominal pain, cramp-like sensation in addition to sharp pain.  Pregnancy test negative, s/p tubal ligation.  Low suspicion for kidney stone based on lack of blood in UA.  Low suspicion for UTI based on lack of nitrites and leukocytes on UA.  No history of diverticulosis/diverticulitis.  Positive history of PCOS, concern for ovarian torsion, ovarian cyst, ovarian cyst rupture.  This is potentially a worsening of her chronic painful menstruation though she notes this feels different than usual.  PID is also possible though she reports no vaginal discharge, no pain with intercourse, no fever. -Continue Tylenol and Motrin for pain control -Phenergan for nausea (12.5 mg every morning) use sparingly due to prolonged QTC -  Follow-up transvaginal ultrasound to assess ovaries -Present to ED for worsening symptoms, fever, vomiting, bloody stool

## 2018-10-26 DIAGNOSIS — G932 Benign intracranial hypertension: Secondary | ICD-10-CM | POA: Diagnosis not present

## 2019-01-25 DIAGNOSIS — G932 Benign intracranial hypertension: Secondary | ICD-10-CM | POA: Diagnosis not present

## 2019-02-06 ENCOUNTER — Other Ambulatory Visit: Payer: Self-pay | Admitting: Family Medicine

## 2019-02-08 ENCOUNTER — Other Ambulatory Visit: Payer: Self-pay

## 2019-02-08 MED ORDER — HYDROCHLOROTHIAZIDE 25 MG PO TABS: 25.0000 mg | ORAL_TABLET | Freq: Every day | ORAL | 0 refills | Status: DC

## 2019-02-08 MED ORDER — AMLODIPINE BESYLATE 10 MG PO TABS: 10.0000 mg | ORAL_TABLET | Freq: Every day | ORAL | 0 refills | Status: DC

## 2019-02-08 NOTE — Telephone Encounter (Signed)
Please ask patient to schedule a follow up visit with me, thanks! Modelle Vollmer J Syed Zukas, MD  

## 2019-03-07 ENCOUNTER — Encounter: Payer: Self-pay | Admitting: Family Medicine

## 2019-03-07 ENCOUNTER — Ambulatory Visit (INDEPENDENT_AMBULATORY_CARE_PROVIDER_SITE_OTHER): Payer: Medicaid Other | Admitting: Family Medicine

## 2019-03-07 ENCOUNTER — Other Ambulatory Visit: Payer: Self-pay

## 2019-03-07 VITALS — BP 124/84 | HR 79 | Wt 232.2 lb

## 2019-03-07 DIAGNOSIS — I10 Essential (primary) hypertension: Secondary | ICD-10-CM | POA: Diagnosis not present

## 2019-03-07 DIAGNOSIS — R5383 Other fatigue: Secondary | ICD-10-CM

## 2019-03-07 DIAGNOSIS — E039 Hypothyroidism, unspecified: Secondary | ICD-10-CM

## 2019-03-07 DIAGNOSIS — E669 Obesity, unspecified: Secondary | ICD-10-CM

## 2019-03-07 DIAGNOSIS — E038 Other specified hypothyroidism: Secondary | ICD-10-CM

## 2019-03-07 LAB — POCT GLYCOSYLATED HEMOGLOBIN (HGB A1C): Hemoglobin A1C: 5.2 % (ref 4.0–5.6)

## 2019-03-07 NOTE — Patient Instructions (Signed)
Checking labs today. Will call if any issues, or will send letter  Call the [facility] to set up an appointment for weight management. Their number is [phone].   Follow up with me in 6 months for your blood pressure.  Be well, Dr. Ardelia Mems

## 2019-03-07 NOTE — Progress Notes (Signed)
Date of Visit: 03/07/2019   HPI:  Anna Anderson presents for follow up with concerns including:  Weight gain: Patient was recently weaned from Topamax to treat her pseudotumor. Since she stopped taking this medication she notes a 30 - 40 lb weight gain. 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 She remains conscious of her dietary choices and continues to make healthy decisions by avoiding sugar containing drinks and fried food. Interested in medication to help with weight loss. Also feeling more fatigued than usual, worse around the time of her menstrual cycle.   Hypertension - currently taking amlodipine 10mg  daily and HCTZ 25mg  daily. Tolerating well. She is fasting today.  Subclinical hypothyroidism - taking levothyrixine 18mcg daily. Fatigued as above, due for TSH check.  ROS: See HPI.  [place]: history of hypertension, pseudotomor cerebri, subclinical hypothyroidism, GAD and PCOS  PHYSICAL EXAM: BP 124/84   Pulse 79   Wt 232 lb 3.2 oz (105.3 kg)   SpO2 98%   BMI 41.13 kg/m  Gen: well appearing woman sitting upright in chair HEENT: normocephalic, atraumatic, no thyromegaly Heart: RRR, normal S1, S2 no m/r/g Lungs: Normal work of breathing, CTAB, no wheezes or crackles Neuro: alert and oriented x 3, EOM intact Extremities: No appreciable lower extremity edema bilaterally   ASSESSMENT/PLAN:  Health maintenance:  - flu shot offered, patient declined  Primary hypertension Well controlled. Continue current regimen.   Obesity (BMI 30-39.9) Weight gain struggles in setting of recently discontinuing her topamax. Labs warranted to identify if weight gain and fatigue related additional medical sequelae such as DM or unresolved hypothyroidism.  - order labs: CBC, CMP, Lipid panel, TSH, Hemoglobin A1c - discussed availability of [facility] (multidisciplinary full scope weight loss clinic). Patient seems interested.  Provided contact information for that clinic.  Subclinical hypothyroidism Compliant with levothyroxine, update TSH today  FOLLOW UP: Follow up in 6 months for hypertension, sooner if needed Schedule with [facility] [place]  Patient seen along with [facility] student Maudry Diego. I personally evaluated this patient along with the student, and verified all aspects of the history, physical exam, and medical decision making as documented by the student. I agree with the student's documentation and have made all necessary edits.  Chrisandra Netters, MD [facility]

## 2019-03-08 LAB — CMP14+EGFR
ALT: 32 IU/L (ref 0–32)
AST: 25 IU/L (ref 0–40)
Albumin/Globulin Ratio: 1.2 (ref 1.2–2.2)
Albumin: 4.1 g/dL (ref 3.8–4.8)
Alkaline Phosphatase: 59 IU/L (ref 39–117)
BUN/Creatinine Ratio: 14 (ref 9–23)
BUN: 12 mg/dL (ref 6–20)
Bilirubin Total: 0.3 mg/dL (ref 0.0–1.2)
CO2: 23 mmol/L (ref 20–29)
Calcium: 9.2 mg/dL (ref 8.7–10.2)
Chloride: 100 mmol/L (ref 96–106)
Creatinine, Ser: 0.86 mg/dL (ref 0.57–1.00)
GFR calc Af Amer: 99 mL/min/{1.73_m2} (ref >59)
GFR calc non Af Amer: 86 mL/min/{1.73_m2} (ref >59)
Globulin, Total: 3.3 g/dL (ref 1.5–4.5)
Glucose: 79 mg/dL (ref 65–99)
Potassium: 3.2 mmol/L — ABNORMAL LOW (ref 3.5–5.2)
Sodium: 139 mmol/L (ref 134–144)
Total Protein: 7.4 g/dL (ref 6.0–8.5)

## 2019-03-08 LAB — CBC
Hematocrit: 39.7 % (ref 34.0–46.6)
Hemoglobin: 13.2 g/dL (ref 11.1–15.9)
MCH: 29.2 pg (ref 26.6–33.0)
MCHC: 33.2 g/dL (ref 31.5–35.7)
MCV: 88 fL (ref 79–97)
Platelets: 265 10*3/uL (ref 150–450)
RBC: 4.52 x10E6/uL (ref 3.77–5.28)
RDW: 13.6 % (ref 11.7–15.4)
WBC: 8.4 10*3/uL (ref 3.4–10.8)

## 2019-03-08 LAB — LIPID PANEL
Chol/HDL Ratio: 5.4 ratio — ABNORMAL HIGH (ref 0.0–4.4)
Cholesterol, Total: 199 mg/dL (ref 100–199)
HDL: 37 mg/dL — ABNORMAL LOW (ref >39)
LDL Chol Calc (NIH): 137 mg/dL — ABNORMAL HIGH (ref 0–99)
Triglycerides: 137 mg/dL (ref 0–149)
VLDL Cholesterol Cal: 25 mg/dL (ref 5–40)

## 2019-03-08 LAB — TSH: TSH: 2.1 u[IU]/mL (ref 0.450–4.500)

## 2019-03-13 NOTE — Assessment & Plan Note (Signed)
Well-controlled.  Continue current regimen. 

## 2019-03-13 NOTE — Assessment & Plan Note (Signed)
Compliant with levothyroxine, update TSH today

## 2019-03-13 NOTE — Assessment & Plan Note (Signed)
Weight gain struggles in setting of recently discontinuing her topamax. Labs warranted to identify if weight gain and fatigue related additional medical sequelae such as DM or unresolved hypothyroidism.  - order labs: CBC, CMP, Lipid panel, TSH, Hemoglobin A1c - discussed availability of [facility] (multidisciplinary full scope weight loss clinic). Patient seems interested. Provided contact information for that clinic.

## 2019-03-14 ENCOUNTER — Telehealth: Payer: Self-pay | Admitting: *Deleted

## 2019-03-14 NOTE — Telephone Encounter (Signed)
Pt is calling for results of her labs.  Christen Bame, CMA

## 2019-03-22 ENCOUNTER — Other Ambulatory Visit: Payer: Self-pay | Admitting: Family Medicine

## 2019-03-22 DIAGNOSIS — E876 Hypokalemia: Secondary | ICD-10-CM

## 2019-03-22 MED ORDER — POTASSIUM CHLORIDE CRYS ER 20 MEQ PO TBCR: 40.0000 meq | EXTENDED_RELEASE_TABLET | Freq: Every day | ORAL | 2 refills | Status: DC

## 2019-03-22 NOTE — Telephone Encounter (Signed)
Spoke with patient about results Will do K supplement Recheck BMET at future lab visit Order entered & appointment scheduled Leeanne Rio, MD

## 2019-04-04 ENCOUNTER — Other Ambulatory Visit: Payer: Medicaid Other

## 2019-04-04 ENCOUNTER — Other Ambulatory Visit: Payer: Self-pay

## 2019-04-04 DIAGNOSIS — E876 Hypokalemia: Secondary | ICD-10-CM | POA: Diagnosis not present

## 2019-04-05 LAB — BASIC METABOLIC PANEL
BUN/Creatinine Ratio: 13 (ref 9–23)
BUN: 11 mg/dL (ref 6–20)
CO2: 23 mmol/L (ref 20–29)
Calcium: 9.7 mg/dL (ref 8.7–10.2)
Chloride: 104 mmol/L (ref 96–106)
Creatinine, Ser: 0.88 mg/dL (ref 0.57–1.00)
GFR calc Af Amer: 96 mL/min/{1.73_m2} (ref >59)
GFR calc non Af Amer: 84 mL/min/{1.73_m2} (ref >59)
Glucose: 105 mg/dL — ABNORMAL HIGH (ref 65–99)
Potassium: 4.1 mmol/L (ref 3.5–5.2)
Sodium: 140 mmol/L (ref 134–144)

## 2019-04-10 ENCOUNTER — Encounter: Payer: Self-pay | Admitting: Family Medicine

## 2019-05-12 ENCOUNTER — Telehealth: Payer: Self-pay

## 2019-05-12 NOTE — Telephone Encounter (Signed)
Patient calls nurse line regarding safety questions related to apple cider vinegar gummy vitamins (Goli). Per patient, she just started taking as her daily vitamin and is supposed to help with appetite suppression. Patient states that listed side effect of gummy is hypokalemia. Patient has had history of low potassium. Patient also wanted to make sure vitamin would not have any adverse reactions with her other medications.   To PCP  Please advise  Talbot Grumbling, RN

## 2019-05-12 NOTE — Telephone Encounter (Signed)
Unfortunately, as vitamins are not regulated by the FDA, their contents are not necessarily reliable and I can't recommend for or against this specific vitamin or say how it might interact with her other medications. We could certainly check a potassium level if she is concerned about hypokalemia.  Please inform patient. I can call her next week if she has more questions.  Thanks Leeanne Rio, MD

## 2019-05-16 NOTE — Telephone Encounter (Signed)
Patient contacted and informed of PCP recommendation. I informed patient if she wanted to continue taking the vitamin we can always check her potassium levels and go from there.

## 2019-06-05 ENCOUNTER — Other Ambulatory Visit: Payer: Self-pay | Admitting: Family Medicine

## 2019-07-05 ENCOUNTER — Telehealth (INDEPENDENT_AMBULATORY_CARE_PROVIDER_SITE_OTHER): Payer: Medicaid Other | Admitting: Family Medicine

## 2019-07-05 ENCOUNTER — Other Ambulatory Visit: Payer: Self-pay

## 2019-07-05 DIAGNOSIS — K5900 Constipation, unspecified: Secondary | ICD-10-CM

## 2019-07-05 MED ORDER — ONDANSETRON HCL 4 MG PO TABS: 4.0000 mg | ORAL_TABLET | Freq: Three times a day (TID) | ORAL | 0 refills | Status: DC | PRN

## 2019-07-07 NOTE — Progress Notes (Signed)
[facility] Telemedicine Visit  Patient consented to have virtual visit. Method of visit: Telephone  Encounter participants: Patient: Anna Anderson - located at home Provider: Guadalupe Dawn - located at home  Chief Complaint: fever, nausea  HPI: 39 year old female who presents for 2-3 day history of nausea, decreased appetite. She feels that she has also been having some hard to localize lower abdominal pain. The patient does report a low-grade temperature of 100.1 with oral thermometer. She has been having very infrequent bowel movements recently. She has not vomited but has dry heaved in 2 occasions. 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 She does not have any other symptoms.  Of note the patient has a history of normal pressure hydrocephalus. She was having blurry vision, severe headache. She has been doing well with no issues from this standpoint in over a year. She does not feel that her symptoms are similar to the NPH symptoms she was having before.  ROS: per HPI  Pertinent PMHx: NPH  Exam:  General: no acute distress, comfortable Resp: able to speak in clear coherent sentences, no respiratory distress  Assessment/Plan:  Constipation Patient's symptoms best explained by constipation. Considered viral infection but lack of diarrhea and instead constipation makes this less likely. No respiratory symptoms to suspect viral URI and has covid test pending from 4/5. Can try miralax, gave titration instructions. Will give short supply of zofran to assist with the nausea. Can follow up as needed, instructed to call back in 2-3 days if symptoms not improved or earlier if new or worsening symptoms.    Time spent during visit with patient: 12 minutes

## 2019-07-07 NOTE — Assessment & Plan Note (Signed)
Patient's symptoms best explained by constipation. Considered viral infection but lack of diarrhea and instead constipation makes this less likely. No respiratory symptoms to suspect viral URI and has covid test pending from 4/5. Can try miralax, gave titration instructions. Will give short supply of zofran to assist with the nausea. Can follow up as needed, instructed to call back in 2-3 days if symptoms not improved or earlier if new or worsening symptoms.

## 2019-09-19 ENCOUNTER — Other Ambulatory Visit: Payer: Self-pay | Admitting: Family Medicine

## 2019-09-20 NOTE — Telephone Encounter (Signed)
Patient calls to check status. Anna Anderson, CMA

## 2019-10-23 ENCOUNTER — Telehealth: Payer: Self-pay | Admitting: Neurology

## 2019-10-23 NOTE — Telephone Encounter (Signed)
Pt said she had been waiting on a phone call from [place]. I inform Pt I would send a message to the nurse to call her tomorrow.

## 2019-10-23 NOTE — Telephone Encounter (Signed)
I returned the call to the patient. She has noticed a return of headaches and dizziness. She is worried about her pseudotumor cerebri and would like to be seen. She would also like to start working with the weight loss center and hopes to be able to take phentermine. She has been scheduled for an appt with Sarah.

## 2019-10-23 NOTE — Telephone Encounter (Signed)
Pt has a few questions - Her Inter cranial pressure is up can she take phentermine? Can you call her she just wanted to discuss a few other things about meds. Do you want her results from her eye dr.? Earliest appt I can get was 9.21. Thank you

## 2019-10-25 ENCOUNTER — Encounter: Payer: Self-pay | Admitting: Neurology

## 2019-10-25 ENCOUNTER — Telehealth: Payer: Self-pay | Admitting: Neurology

## 2019-10-25 ENCOUNTER — Ambulatory Visit: Payer: Medicaid Other | Admitting: Neurology

## 2019-10-25 ENCOUNTER — Other Ambulatory Visit: Payer: Self-pay

## 2019-10-25 VITALS — BP 130/94 | HR 93 | Wt 246.0 lb

## 2019-10-25 DIAGNOSIS — G932 Benign intracranial hypertension: Secondary | ICD-10-CM

## 2019-10-25 MED ORDER — TOPIRAMATE 100 MG PO TABS: 100.0000 mg | ORAL_TABLET | Freq: Two times a day (BID) | ORAL | 11 refills | Status: DC
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
## 2019-10-25 NOTE — Telephone Encounter (Signed)
Please try to get most recent ophthalmology notes from Dr. Manuella Ghazi at [facility] on [address].

## 2019-10-25 NOTE — Telephone Encounter (Signed)
I called [facility] and spoke to Ivin Booty in medical Records who said she would fax the records to [phone].

## 2019-10-25 NOTE — Patient Instructions (Signed)
Start taking Topamax 100 mg twice daily  I will get your records from your eye doctor If symptoms don't improve, call back to consider LP See you back in 3 months

## 2019-10-26 NOTE — Telephone Encounter (Signed)
Received to SS/NP desk for review.

## 2019-10-30 NOTE — Telephone Encounter (Signed)
Received ophthalmology evaluation, office visit September 28, 2019, reporting headaches every day for the past week and a half, blurry vision, ringing in the years with Dr. Manuella Ghazi, no recurrence of disc edema bilaterally, recommended to discuss with neurology about restarting treatment.  Following up in 3 to 4 months with ophthalmology.  No disc edema seen in the right or left eye.  Saw the patient 10/25/2019, restarted Topamax 100 mg BID for recurrence of symptoms.

## 2019-11-07 ENCOUNTER — Telehealth: Payer: Self-pay | Admitting: Neurology

## 2019-11-07 NOTE — Telephone Encounter (Signed)
I reached out to the pt. She wanted to confirm if there was a contraindication between her taking topamax and a calcium with vit d supplement otc.  Pt was advised no interaction between the two and could proceed with taking.  Pt verbalized understanding.

## 2019-11-07 NOTE — Telephone Encounter (Signed)
Pt walked into the lobby and has questions about Topamax - pt wanted to know if she could take calcium + D3 suppliment best call back [phone]

## 2019-11-07 NOTE — Telephone Encounter (Signed)
Pt. Has a question regarding her medication. She would like to know if it is okay for her to take calcium + D3 pills to help with bone pain while taking topamax.  Pt stated it would be alright for a voicemail to be left if she does not answer.

## 2019-12-18 ENCOUNTER — Telehealth: Payer: Self-pay | Admitting: Neurology

## 2019-12-18 NOTE — Telephone Encounter (Signed)
Pt called wanting to know if her topiramate (TOPAMAX) 100 MG tablet can be dropped down to 100mg  a day. Pt states she is not feeling to well on the 200mg .

## 2019-12-18 NOTE — Telephone Encounter (Signed)
I called pt and she would like to go back to taking 100mg  po daily, as she stated that IIH is under control, she has been not feeling well for the last 4-5 days and feels like it is the topamax.  Would like to do this for 2 months.  Please advise.

## 2019-12-18 NOTE — Telephone Encounter (Signed)
I called pt and relayed that per SS/NP she can try taking 200mg  po PM vs 100mg  [po BID and see how she feels will let us know.  She wants to come off this medication at some point, she needs to lose weight.  She verbalized understanding.

## 2019-12-18 NOTE — Telephone Encounter (Signed)
When last seen 10/25/19, her symptoms of pseudotumor were returning, she had had significant weight gain, we restarted Topamax 100 mg twice a day.  She could try 200 mg Topamax at bedtime instead of twice daily, really depends on her pseudotumor symptoms, ophthalmology evaluation. Or even 150 mg daily.

## 2019-12-19 ENCOUNTER — Ambulatory Visit: Payer: Medicaid Other | Admitting: Neurology

## 2019-12-22 DIAGNOSIS — Z124 Encounter for screening for malignant neoplasm of cervix: Secondary | ICD-10-CM | POA: Diagnosis not present

## 2019-12-22 DIAGNOSIS — E282 Polycystic ovarian syndrome: Secondary | ICD-10-CM | POA: Diagnosis not present

## 2019-12-22 LAB — HM PAP SMEAR: HM Pap smear: NEGATIVE

## 2019-12-28 ENCOUNTER — Other Ambulatory Visit: Payer: Self-pay | Admitting: Family Medicine

## 2020-01-01 ENCOUNTER — Other Ambulatory Visit: Payer: Self-pay

## 2020-01-01 ENCOUNTER — Ambulatory Visit (INDEPENDENT_AMBULATORY_CARE_PROVIDER_SITE_OTHER): Payer: Medicaid Other | Admitting: Family Medicine

## 2020-01-01 ENCOUNTER — Encounter: Payer: Self-pay | Admitting: Family Medicine

## 2020-01-01 VITALS — BP 124/70 | HR 81 | Wt 229.0 lb

## 2020-01-01 DIAGNOSIS — R109 Unspecified abdominal pain: Secondary | ICD-10-CM | POA: Insufficient documentation

## 2020-01-01 DIAGNOSIS — R1032 Left lower quadrant pain: Secondary | ICD-10-CM

## 2020-01-01 HISTORY — DX: Unspecified abdominal pain: R10.9

## 2020-01-01 LAB — POCT URINALYSIS DIP (MANUAL ENTRY)
Bilirubin, UA: NEGATIVE
Blood, UA: NEGATIVE
Glucose, UA: NEGATIVE mg/dL
Ketones, POC UA: NEGATIVE mg/dL
Leukocytes, UA: NEGATIVE
Nitrite, UA: NEGATIVE
Protein Ur, POC: NEGATIVE mg/dL
Spec Grav, UA: 1.02 (ref 1.010–1.025)
Urobilinogen, UA: 0.2 E.U./dL
pH, UA: 6 (ref 5.0–8.0)

## 2020-01-01 LAB — POCT URINE PREGNANCY: Preg Test, Ur: NEGATIVE

## 2020-01-01 NOTE — Assessment & Plan Note (Addendum)
Differential Diagnosis: either ovarian cyst vs. Diverticulitis. We are trying to get CT abd/pelvis ordered for ASAP. UA normal, no evidence for UTI. Cannot rule out painful cyst on Ovary as cause of pain. Low suspicion for pregnancy as patient has Tubal ligation. PID possible, however, no vaginal discharge or pelvic pain.  1. For pain: Tylenol 500mg  (1 tablet) with Ibuprofen 200mg  (1 tablet) TOGETHER every 4-6 hours. 2. CT abdomen/pelvis ordered to rule out colitis, diverticulitis, abdominal mass. 3.  Checking CBC at today's visit as well  4. Present to ED for symptoms such as nausea, vomiting, fevers, chills, sweats, blood in stools

## 2020-01-01 NOTE — Progress Notes (Signed)
    SUBJECTIVE:   CHIEF COMPLAINT / HPI:   Left lower quadrant abdominal pain: This is a pleasant patient presenting to clinic today reporting that she has had a left lower quadrant abdominal pain that radiates to the suprapubic region that started the last week.  She reports that the pain got worse, so she called her OB/GYN, had a Pap smear performed and STI checks, all these results were normal.  Her OB/GYN schedule an appointment for the patient to have a lower abdominal ultrasound on October 11 (patient does have a history of PCOS, her OB/GYN was concerned that she was maybe having pain due to an ovarian cyst).  The patient presents today because she is concerned she cannot make it until October 11 due to the pain.  She feels an intense throbbing, pressure, soreness in the left lower quadrant of her abdomen.  She denies any flank pain.  She denies any fevers but does report having chills and nausea.  She does not notice any changes in her pain with having a bowel movement.  Denies constipation, diarrhea.  PERTINENT  PMH / PSH: PCOS  OBJECTIVE:   BP 124/70   Pulse 81   Wt 229 lb (103.9 kg)   LMP 11/30/2019   SpO2 99%   BMI 40.57 kg/m    Physical exam: General: Pleasant patient, nontoxic-appearing Respiratory: CTA bilaterally, comfortable work of breathing Cardio:, S1-S2 present, no murmurs appreciated Abdomen: Normal bowel sounds appreciated throughout, soft, masslike appreciated, significant tenderness appreciated to left lower quadrant that radiates to suprapubic region, no CVA tenderness, negative Rovsing, negative rebound tenderness   ASSESSMENT/PLAN:   Left lower quadrant abdominal pain Differential Diagnosis: either ovarian cyst vs. Diverticulitis. We are trying to get CT abd/pelvis ordered for ASAP. UA normal, no evidence for UTI. Cannot rule out painful cyst on Ovary as cause of pain. Low suspicion for pregnancy as patient has Tubal ligation. PID possible, however, no vaginal  discharge or pelvic pain.  1. For pain: Tylenol 500mg  (1 tablet) with Ibuprofen 200mg  (1 tablet) TOGETHER every 4-6 hours. 2. CT abdomen/pelvis ordered to rule out colitis, diverticulitis, abdominal mass. 3.  Checking CBC at today's visit as well  4. Present to ED for symptoms such as nausea, vomiting, fevers, chills, sweats, blood in stools     Daisy Floro, [place]

## 2020-01-01 NOTE — Patient Instructions (Addendum)
  Thank you for coming in to see [place] today! Please see below to review our plan for today's visit:  1. Differential Diagnosis: either ovarian cyst vs. Diverticulitis. We are trying to get CT abd/pelvis ordered for ASAP. In the meantime still go to the [place].  2. For pain: Tylenol 500mg  (1 tablet) with Ibuprofen 200mg  (1 tablet) TOGETHER every 4-6 hours. 3. Please let me know if your symptoms are worse: nausea, vomiting, fevers, chills, sweats   Please call the clinic at [phone] if your symptoms worsen or you have any concerns. It was our pleasure to serve you!   Dr. Milus Banister [facility]

## 2020-01-02 LAB — CBC WITH DIFFERENTIAL
Basophils Absolute: 0 10*3/uL (ref 0.0–0.2)
Basos: 0 %
EOS (ABSOLUTE): 0.1 10*3/uL (ref 0.0–0.4)
Eos: 1 %
Hematocrit: 42.1 % (ref 34.0–46.6)
Hemoglobin: 13.4 g/dL (ref 11.1–15.9)
Immature Grans (Abs): 0 10*3/uL (ref 0.0–0.1)
Immature Granulocytes: 0 %
Lymphocytes Absolute: 2.3 10*3/uL (ref 0.7–3.1)
Lymphs: 25 %
MCH: 27.7 pg (ref 26.6–33.0)
MCHC: 31.8 g/dL (ref 31.5–35.7)
MCV: 87 fL (ref 79–97)
Monocytes Absolute: 0.6 10*3/uL (ref 0.1–0.9)
Monocytes: 6 %
Neutrophils Absolute: 6.2 10*3/uL (ref 1.4–7.0)
Neutrophils: 68 %
RBC: 4.83 x10E6/uL (ref 3.77–5.28)
RDW: 14.2 % (ref 11.7–15.4)
WBC: 9.2 10*3/uL (ref 3.4–10.8)

## 2020-01-03 ENCOUNTER — Encounter: Payer: Self-pay | Admitting: Family Medicine

## 2020-01-05 ENCOUNTER — Other Ambulatory Visit: Payer: Self-pay | Admitting: Family Medicine

## 2020-01-05 DIAGNOSIS — R1031 Right lower quadrant pain: Secondary | ICD-10-CM

## 2020-01-16 ENCOUNTER — Other Ambulatory Visit: Payer: Self-pay

## 2020-01-16 ENCOUNTER — Ambulatory Visit
Admission: RE | Admit: 2020-01-16 | Discharge: 2020-01-16 | Disposition: A | Discharge status: Home / Self Care | Payer: Medicaid Other | Source: Ambulatory Visit | Attending: Family Medicine | Admitting: Family Medicine

## 2020-01-16 ENCOUNTER — Telehealth: Payer: Self-pay | Admitting: Family Medicine

## 2020-01-16 ENCOUNTER — Other Ambulatory Visit: Payer: Medicaid Other

## 2020-01-16 ENCOUNTER — Other Ambulatory Visit: Payer: Self-pay | Admitting: Family Medicine

## 2020-01-16 DIAGNOSIS — L309 Dermatitis, unspecified: Secondary | ICD-10-CM

## 2020-01-16 DIAGNOSIS — R1031 Right lower quadrant pain: Secondary | ICD-10-CM

## 2020-01-16 MED ORDER — TRIAMCINOLONE ACETONIDE 0.5 % EX OINT: 1.0000 "application " | TOPICAL_OINTMENT | Freq: Two times a day (BID) | CUTANEOUS | 3 refills | Status: DC

## 2020-01-16 NOTE — Telephone Encounter (Signed)
Telephone call  While working in the clinic I received a telephone call from [place], she has been contacted by an Database administrator at [place] per the patient Ms. M[place]sby is currently located to have her CT abdomen/pelvis with contrast performed for history of abdominal pain.  The patient had difficulty swallowing the p.o. contrast, was only able to drink 1 bottle and was hesitant to receive the IV contrast due to concern for a family member having a bad allergic reaction to the IV contrast.  I contacted the patient who was very pleasant.  The patient stated that her symptoms have completely resolved, states they resolved shortly after the January 01, 2020 appointment after she went home and took a laxative.  My suspicion for the patient having an ominous cause of her abdominal pain, such as appendicitis, are very low at this point.  No history of night sweats, fevers, unintentional weight loss, blood in stools, change in stool caliber.  This was discussed with attending, feel patient does not need CT abdomen/pelvis at this time.  Can follow-up should she have repeat abdominal pain.  Patient is in agreement with the plan.   Milus Banister, [place], PGY-3 01/16/2020 12:39 PM

## 2020-01-31 ENCOUNTER — Ambulatory Visit: Payer: Medicaid Other | Admitting: Neurology

## 2020-01-31 ENCOUNTER — Encounter: Payer: Self-pay | Admitting: Neurology

## 2020-01-31 VITALS — BP 128/84 | Ht 63.0 in | Wt 224.0 lb

## 2020-01-31 DIAGNOSIS — G932 Benign intracranial hypertension: Secondary | ICD-10-CM
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
## 2020-01-31 NOTE — Patient Instructions (Signed)
Please see Dr. Manuella Ghazi for eye evaluation  Let's continue the Topamax for now See you back in 3 months

## 2020-02-19 NOTE — Progress Notes (Signed)
I have reviewed and agreed above plan. 

## 2020-03-01 DIAGNOSIS — G930 Cerebral cysts: Secondary | ICD-10-CM | POA: Diagnosis not present

## 2020-03-11 ENCOUNTER — Telehealth: Payer: Self-pay | Admitting: Neurology

## 2020-03-11 ENCOUNTER — Encounter: Payer: Self-pay | Admitting: Family Medicine

## 2020-03-11 NOTE — Telephone Encounter (Signed)
Reviewed the chart, recent ophthalmology evaluation July 2021 showed no evidence of papilledema  It is okay to stop Topamax for intracranial hypertension, but chart also reported that Topamax has helped her frequent headaches,  To avoid the side effect of Topamax, the options are,  1. decrease Topamax to 100 mg at nighttime only 2.  even further lower dose of Topamax 50 mg every night 3.  or stop Topamax totally 4.  Call us back for worsening headaches, consider other preventive medications.

## 2020-03-11 NOTE — Telephone Encounter (Signed)
Pt called, went to my eye doctor and everything is fine. Would like for Dr. Krista Blue to take me off topiramate (TOPAMAX) 100 MG tablet. Having reaction: messing with my memory; could not remember how to get home. Can not function with this medication. Would like a call from the nurse.

## 2020-03-11 NOTE — Telephone Encounter (Signed)
I returned the call to the patient. States her headaches have not been an issue recently. She also has an appt to restart Phentermine and hopes to lose weight. She would like to wean off topiramate 100mg , two tabs QHS. She is going to take 100mg  QHS for five days, then 50mg  QHS for five days, then stop. She will call us back, if her headaches become a problem.

## 2020-04-01 ENCOUNTER — Other Ambulatory Visit: Payer: Self-pay | Admitting: Family Medicine

## 2020-04-01 NOTE — Telephone Encounter (Signed)
Called patient to ask her to schedule follow up appointment We have her scheduled to see me in a few weeks Will refill medications She declines flu shot Wants to discuss BBL surgery - will discuss at her appointment  Latrelle Dodrill, MD

## 2020-04-16 ENCOUNTER — Telehealth (INDEPENDENT_AMBULATORY_CARE_PROVIDER_SITE_OTHER): Payer: Medicaid Other | Admitting: Family Medicine

## 2020-04-16 ENCOUNTER — Encounter: Payer: Self-pay | Admitting: Family Medicine

## 2020-04-16 ENCOUNTER — Other Ambulatory Visit: Payer: Self-pay

## 2020-04-16 DIAGNOSIS — E669 Obesity, unspecified: Secondary | ICD-10-CM | POA: Diagnosis not present

## 2020-04-16 DIAGNOSIS — E038 Other specified hypothyroidism: Secondary | ICD-10-CM

## 2020-04-16 DIAGNOSIS — G932 Benign intracranial hypertension: Secondary | ICD-10-CM | POA: Diagnosis not present

## 2020-04-16 DIAGNOSIS — Z1159 Encounter for screening for other viral diseases: Secondary | ICD-10-CM | POA: Diagnosis not present

## 2020-04-16 DIAGNOSIS — I10 Essential (primary) hypertension: Secondary | ICD-10-CM

## 2020-04-16 NOTE — Assessment & Plan Note (Signed)
Scheduled lab visit Thurs to check TSH. Continue levothyroxine

## 2020-04-16 NOTE — Assessment & Plan Note (Signed)
Continues to follow up regularly with neurology and ophthalmology.

## 2020-04-16 NOTE — Progress Notes (Signed)
[facility] Telemedicine Visit  Patient consented to have virtual visit and was identified by name and date of birth. Method of visit: Video  Encounter participants: Patient: Anna Anderson - located at home Provider: Chrisandra Netters - located at home office Others (if applicable): n/a  Chief Complaint: follow up and medication refill  HPI:  ZOIEY Anderson is seen today for follow up.   Hypertension - currently taking amlodipine 10mg  daily and HCTZ 25mg  daily. Tolerating these well. Also has potassium pills which she takes just as needed if she gets cramping in her legs. Recent BP at neurology office was 128/84.  Pseudotumor - has weaned herself down off topamax as she was feeling more brain fog from it. She would like labs to check and make sure the topamax hasn't caused her any harm. Will be seeing ophtho again soon to recheck her eyes.  Subclinical hypothyroidism - taking levothryoxine 74mcg daily, tolerating well, due for TSH recheck  Obesity - is working on weight loss. Contemplating lap band surgery versus BBL surgery.   Pertinent PMHx: history of hypertension, obesity, subclinical hypothyroidism, pseudotumor  Exam:  Gen: no acute distress, pleasant, cooperative, well appearing Resp: normal work of breathing  Neuro alert, grossly nonfocal, speech normal Psych: normal range of affect, well groomed, speech normal in rate and volume, normal eye contact   Assessment/Plan:  Obesity (BMI 30-39.9) Encouraged efforts at weight loss, staying physically active Briefly discussed weight loss surgery. If she decides she wants to pursue this or BBL (cosmetic) surgery she will schedule a visit to discuss in more detail.  Primary hypertension Well controlled at last blood pressure reading in December. Continue present medications. Scheduled lab visit on Thurs to monitor CMET and lipids.  Subclinical hypothyroidism Scheduled lab visit Thurs to check  TSH. Continue levothyroxine  Pseudotumor cerebri Continues to follow up regularly with neurology and ophthalmology.    Health maintenance - due for Tdap vaccine soon (April) - scheduled nurse visit to go ahead and update this on Thursday - reminded her of COVID booster due in Feb, she plans to get this - will draw hep C screen with labs  Time spent during visit with patient: 17 minutes

## 2020-04-16 NOTE — Assessment & Plan Note (Signed)
Well controlled at last blood pressure reading in December. Continue present medications. Scheduled lab visit on Thurs to monitor CMET and lipids.

## 2020-04-16 NOTE — Assessment & Plan Note (Signed)
Encouraged efforts at weight loss, staying physically active Briefly discussed weight loss surgery. If she decides she wants to pursue this or BBL (cosmetic) surgery she will schedule a visit to discuss in more detail.

## 2020-04-18 ENCOUNTER — Other Ambulatory Visit: Payer: Medicaid Other

## 2020-04-18 ENCOUNTER — Ambulatory Visit (INDEPENDENT_AMBULATORY_CARE_PROVIDER_SITE_OTHER): Payer: Medicaid Other

## 2020-04-18 ENCOUNTER — Other Ambulatory Visit: Payer: Self-pay

## 2020-04-18 DIAGNOSIS — Z23 Encounter for immunization: Secondary | ICD-10-CM | POA: Diagnosis not present

## 2020-04-18 DIAGNOSIS — E038 Other specified hypothyroidism: Secondary | ICD-10-CM

## 2020-04-18 DIAGNOSIS — Z1159 Encounter for screening for other viral diseases: Secondary | ICD-10-CM

## 2020-04-18 DIAGNOSIS — E669 Obesity, unspecified: Secondary | ICD-10-CM

## 2020-04-18 NOTE — Progress Notes (Signed)
Tdap administered RD without complication.

## 2020-04-19 LAB — CMP14+EGFR
ALT: 26 IU/L (ref 0–32)
AST: 17 IU/L (ref 0–40)
Albumin/Globulin Ratio: 1.2 (ref 1.2–2.2)
Albumin: 4.1 g/dL (ref 3.8–4.8)
Alkaline Phosphatase: 67 IU/L (ref 44–121)
BUN/Creatinine Ratio: 15 (ref 9–23)
BUN: 15 mg/dL (ref 6–20)
Bilirubin Total: 0.2 mg/dL (ref 0.0–1.2)
CO2: 22 mmol/L (ref 20–29)
Calcium: 9.3 mg/dL (ref 8.7–10.2)
Chloride: 101 mmol/L (ref 96–106)
Creatinine, Ser: 0.97 mg/dL (ref 0.57–1.00)
GFR calc Af Amer: 85 mL/min/{1.73_m2} (ref >59)
GFR calc non Af Amer: 74 mL/min/{1.73_m2} (ref >59)
Globulin, Total: 3.5 g/dL (ref 1.5–4.5)
Glucose: 105 mg/dL — ABNORMAL HIGH (ref 65–99)
Potassium: 3.4 mmol/L — ABNORMAL LOW (ref 3.5–5.2)
Sodium: 140 mmol/L (ref 134–144)
Total Protein: 7.6 g/dL (ref 6.0–8.5)

## 2020-04-19 LAB — HCV AB W REFLEX TO QUANT PCR: HCV Ab: 0.1 s/co ratio (ref 0.0–0.9)

## 2020-04-19 LAB — CBC
Hematocrit: 39.8 % (ref 34.0–46.6)
Hemoglobin: 13 g/dL (ref 11.1–15.9)
MCH: 28.9 pg (ref 26.6–33.0)
MCHC: 32.7 g/dL (ref 31.5–35.7)
MCV: 88 fL (ref 79–97)
Platelets: 240 10*3/uL (ref 150–450)
RBC: 4.5 x10E6/uL (ref 3.77–5.28)
RDW: 13.8 % (ref 11.7–15.4)
WBC: 4.7 10*3/uL (ref 3.4–10.8)

## 2020-04-19 LAB — TSH: TSH: 2.05 u[IU]/mL (ref 0.450–4.500)

## 2020-04-19 LAB — LIPID PANEL
Chol/HDL Ratio: 4.8 ratio — ABNORMAL HIGH (ref 0.0–4.4)
Cholesterol, Total: 155 mg/dL (ref 100–199)
HDL: 32 mg/dL — ABNORMAL LOW (ref >39)
LDL Chol Calc (NIH): 99 mg/dL (ref 0–99)
Triglycerides: 131 mg/dL (ref 0–149)
VLDL Cholesterol Cal: 24 mg/dL (ref 5–40)

## 2020-04-19 LAB — HCV INTERPRETATION

## 2020-05-02 ENCOUNTER — Telehealth: Payer: Self-pay | Admitting: Family Medicine

## 2020-05-02 MED ORDER — POTASSIUM CHLORIDE CRYS ER 20 MEQ PO TBCR
40.0000 meq | EXTENDED_RELEASE_TABLET | Freq: Every day | ORAL | 2 refills | Status: DC
Start: 2020-05-02 — End: 2021-07-28

## 2020-05-02 NOTE — Addendum Note (Signed)
Addended by: Leeanne Rio on: 05/02/2020 03:00 PM   Modules accepted: Orders

## 2020-05-02 NOTE — Telephone Encounter (Signed)
Called patient to discuss results.  K mildly low. She admits to not taking K supplements prior to checking labs but has since restarted it  Will refill her K supplement Otherwise labs unremarkable  Scheduled nurse visit appts for patient and her daughter to get their COVID boosters since they are due 2/15.  Patient appreciative  Leeanne Rio, MD

## 2020-05-06 ENCOUNTER — Ambulatory Visit: Payer: Medicaid Other | Admitting: Neurology

## 2020-05-14 ENCOUNTER — Other Ambulatory Visit: Payer: Self-pay

## 2020-05-14 ENCOUNTER — Ambulatory Visit (INDEPENDENT_AMBULATORY_CARE_PROVIDER_SITE_OTHER): Payer: Medicaid Other

## 2020-05-14 DIAGNOSIS — Z23 Encounter for immunization: Secondary | ICD-10-CM | POA: Diagnosis present

## 2020-05-14 NOTE — Addendum Note (Signed)
Addended by: Talbot Grumbling on: 05/14/2020 10:53 AM   Modules accepted: Level of Service

## 2020-05-14 NOTE — Progress Notes (Signed)
   Covid-19 Vaccination Clinic  Name:  Anna Anderson    MRN: [id number] DOB: 12/19/1980  05/14/2020   Patient presents to nurse clinic for [place] booster. Precepted with Dr. Owens Shark regarding time since second COVID dose (5 months). Per CDC guidelines and Dr. Owens Shark, it is safe to proceed with booster vaccine. Administered in RD, site unremarkable, tolerated injection well.   Anna Anderson was observed post Covid-19 immunization for 15 minutes without incident. She was provided with Vaccine Information Sheet and instruction to access the V-Safe system.   Anna Anderson was instructed to call 911 with any severe reactions post vaccine: Marland Kitchen Difficulty breathing  . Swelling of face and throat  . A fast heartbeat  . A bad rash all over body  . Dizziness and weakness   Immunizations Administered    Name Date Dose VIS Date Route   PFIZER Comrnaty(Gray TOP) Covid-19 Vaccine 05/14/2020 10:30 AM 0.3 mL 03/07/2020 Intramuscular   Manufacturer: [employer]   Lot: [id number]   NDC: [id number]     Provided patient with updated immunization card.   Talbot Grumbling, RN

## 2020-07-09 ENCOUNTER — Ambulatory Visit: Payer: Medicaid Other | Admitting: Neurology

## 2020-08-01 ENCOUNTER — Other Ambulatory Visit: Payer: Self-pay | Admitting: Family Medicine

## 2020-08-05 ENCOUNTER — Other Ambulatory Visit: Payer: Self-pay | Admitting: Family Medicine

## 2020-08-06 NOTE — Telephone Encounter (Signed)
Patient calls nurse line checking on status of medication refills. Please advise.

## 2020-10-14 ENCOUNTER — Ambulatory Visit (INDEPENDENT_AMBULATORY_CARE_PROVIDER_SITE_OTHER): Payer: Medicaid Other | Admitting: Family Medicine

## 2020-10-14 ENCOUNTER — Ambulatory Visit (HOSPITAL_COMMUNITY)
Admission: RE | Admit: 2020-10-14 | Discharge: 2020-10-14 | Disposition: A | Discharge status: Home / Self Care | Payer: Medicaid Other | Source: Ambulatory Visit | Attending: Family Medicine | Admitting: Family Medicine

## 2020-10-14 ENCOUNTER — Other Ambulatory Visit: Payer: Self-pay

## 2020-10-14 VITALS — BP 133/97 | HR 71

## 2020-10-14 DIAGNOSIS — R002 Palpitations: Secondary | ICD-10-CM

## 2020-10-14 DIAGNOSIS — I10 Essential (primary) hypertension: Secondary | ICD-10-CM | POA: Diagnosis not present

## 2020-10-14 NOTE — Assessment & Plan Note (Signed)
-  EKG normal: does not denote any arrhythmias or obvious abnormality -most recent blood work earlier this year overall reassuring, TSH wnl at this time as well  -likely secondary to diet pills due to significant caffeine content  -instructed to discontinue diet pills -pending CBC, BMP, TSH, Mg -strict return precautions discussed, instructed to follow up in 2 weeks, may consider echo is persists

## 2020-10-14 NOTE — Progress Notes (Signed)
Received notification from front office staff that patient presented to clinic with complaints of shortness of breath and chest fluttering. Patient had BP obtained while at work with measurement of 144/96.   Patient ambulated to room with no difficulty. Patient is well appearing upon assessment and is able to speak in complete sentences.   Obtained VS. BP 126/91, HR 72, SpO2: 98%.   Patient also reports starting keto weight loss pill for four days. Patient noticed symptom onset shortly after starting pills.   Spoke with precepting physician (Dr. McDiarmid) regarding patient. Advised the EKG be performed. EKG completed with Normal sinus rhythm. Scheduled patient for same day access to care appointment.   Spoke with provider Donney Dice regarding patient. No new orders at this time. Patient ambulated to exam room with no difficulties.   Talbot Grumbling, RN

## 2020-10-14 NOTE — Progress Notes (Signed)
    SUBJECTIVE:   CHIEF COMPLAINT / HPI:   Patient presents with new onset heart fluttering and palpitations that started Saturday which was when it was at its worst. Noted at the time to have a BP of 144/96. Endorsing mild dizziness and intermittent dyspnea at this time. But denies chest pain at any point. Currently continues to deny chest pain, dizziness, headaches, dyspnea and any other symptoms. Has a history of pseudotumor cerebri which used to cause her to have headaches and was on topamax. She follows up regularly with neurology outpatient for her pseudotumor cerebri. Had not had a headache in awhile until recently when she gained weight which she believes is the cause to her headaches. She only feels good when she maintains her weight to a certain degree and because of this recently started taking keto slim diet pills a week ago to get her weight back on track. She was not prescribed these by a provider but decided to take them herself. She started out with one pill and has worked up to 3 pills a day at minimum. Used to have panic attacks but had not had one in 4 years, states that she does not have any new stressors at this time. Typically works about 16 hour days but this does not seem to be new. Presence of cardiac history in the family, mother had open heart surgery at the age of 33 but is doing well now.   PERTINENT  PMH / PSH:   Hypertension Compliant on amlodipine and HCTZ daily. Denies chest pain and dyspnea.   OBJECTIVE:   BP (!) 133/97   Pulse 71   LMP  (LMP Unknown)   SpO2 100%   General: Patient well-appearing, in no acute distress. HEENT: non-tender thyroid  CV: RRR, no murmurs or gallops auscultated Resp: CTAB, no wheezing or rales noted Abdomen: soft, nontender, nondistended, presence of bowel sounds OZY:[id number] along palpation of left chest wall  Ext: radial pulses strong and equal bilaterally, no LE edema noted bilaterally Neuro: normal gait Psych: mood  appropriate   ASSESSMENT/PLAN:   Heart palpitations -EKG normal: does not denote any arrhythmias or obvious abnormality -most recent blood work earlier this year overall reassuring, TSH wnl at this time as well  -likely secondary to diet pills due to significant caffeine content  -instructed to discontinue diet pills -pending CBC, BMP, TSH, Mg -strict return precautions discussed, instructed to follow up in 2 weeks, may consider echo is persists   Primary hypertension -BP 126/91 -continue amlodipine and HCTZ as previously prescribed  -extensive diet and exercise counseling       Donney Dice, [place]

## 2020-10-14 NOTE — Assessment & Plan Note (Signed)
-  BP 126/91 -continue amlodipine and HCTZ as previously prescribed  -extensive diet and exercise counseling

## 2020-10-14 NOTE — Patient Instructions (Addendum)
It was great seeing you today!  Today we discussed your palpitations which seem to be new and something you have never experienced before. I am glad you are feeling better. I believe your fluttering sensation is due to the new diet pills you are taking since they contain a large amount of caffeine. I would advise to stop taking these. Your EKG looked good, we also will get some lab work today which I will notify you of any abnormal results.  Please try to eat healthy and incorporate regular physical activity daily.   Please follow up at your next scheduled appointment in 2 weeks, if anything arises between now and then, please don't hesitate to contact our office. If your palpitations persist, please come in sooner.    Thank you for allowing [place] to be a part of your medical care!  Thank you, Dr. Larae Grooms

## 2020-10-15 LAB — CBC
Hematocrit: 38.1 % (ref 34.0–46.6)
Hemoglobin: 12.7 g/dL (ref 11.1–15.9)
MCH: 29.4 pg (ref 26.6–33.0)
MCHC: 33.3 g/dL (ref 31.5–35.7)
MCV: 88 fL (ref 79–97)
Platelets: 263 10*3/uL (ref 150–450)
RBC: 4.32 x10E6/uL (ref 3.77–5.28)
RDW: 14.2 % (ref 11.7–15.4)
WBC: 6 10*3/uL (ref 3.4–10.8)

## 2020-10-15 LAB — BASIC METABOLIC PANEL
BUN/Creatinine Ratio: 13 (ref 9–23)
BUN: 12 mg/dL (ref 6–20)
CO2: 17 mmol/L — ABNORMAL LOW (ref 20–29)
Calcium: 9.1 mg/dL (ref 8.7–10.2)
Chloride: 105 mmol/L (ref 96–106)
Creatinine, Ser: 0.92 mg/dL (ref 0.57–1.00)
Glucose: 79 mg/dL (ref 65–99)
Potassium: 4 mmol/L (ref 3.5–5.2)
Sodium: 140 mmol/L (ref 134–144)
eGFR: 81 mL/min/{1.73_m2} (ref >59)

## 2020-10-15 LAB — TSH: TSH: 2.13 u[IU]/mL (ref 0.450–4.500)

## 2020-11-25 IMAGING — US US PELVIS COMPLETE WITH TRANSVAGINAL
1 series · 13 of 25 positions shown · non-contrast
Comparison: Prior pelvic ultrasound from 08/25/2007.

CLINICAL DATA: Initial evaluation for acute left lower quadrant
pain since 6 p.m. yesterday.



[Series 1: us pelvis complete with transvaginal · 13 of 107 slices shown]
[im 1/107]
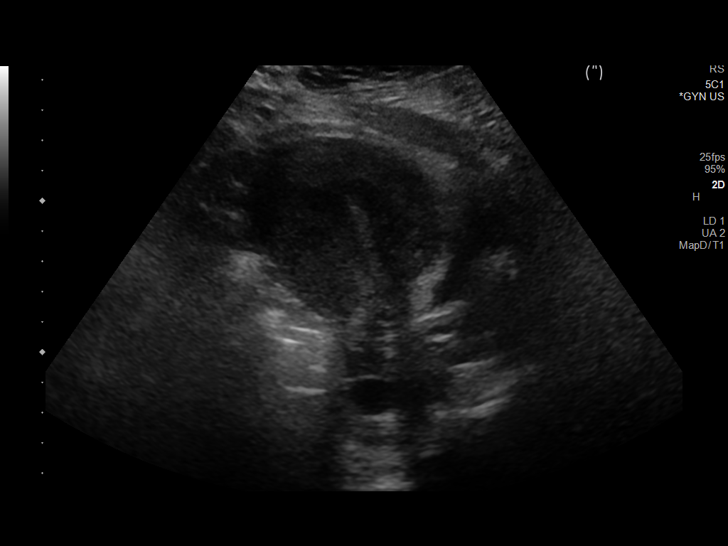
[im 9/107]
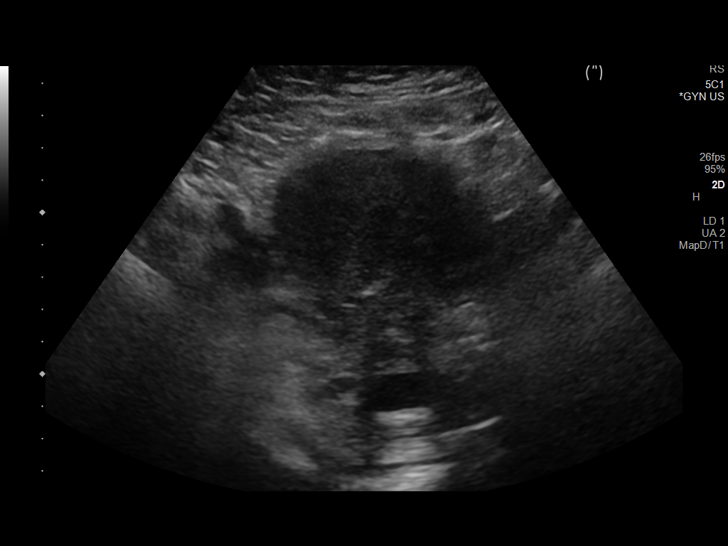
[im 18/107]
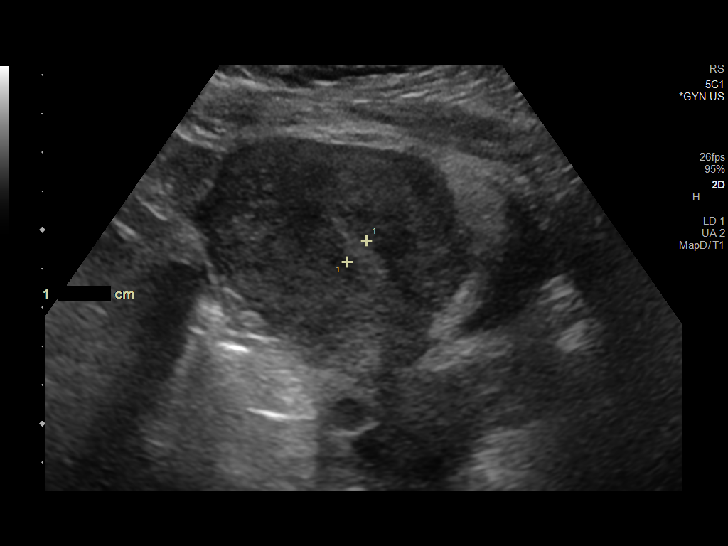
[im 27/107]
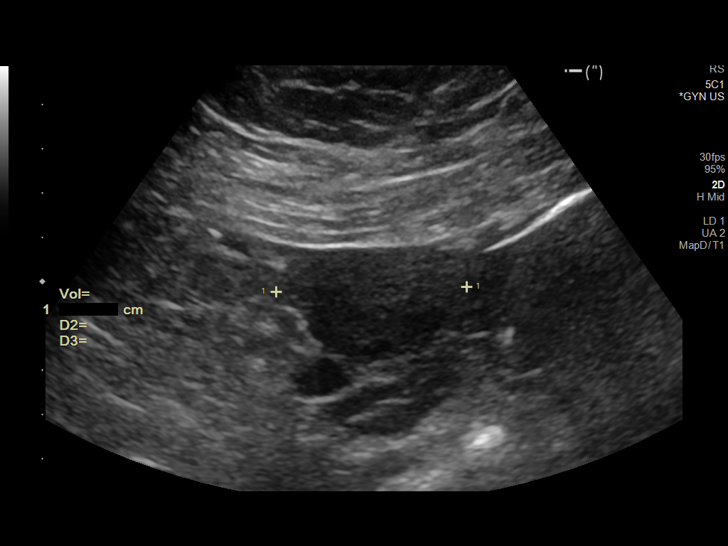
[im 36/107]
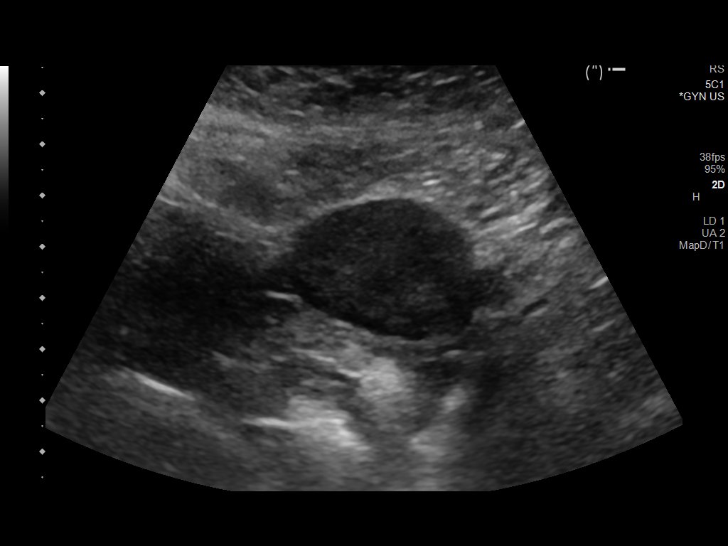
[im 45/107]
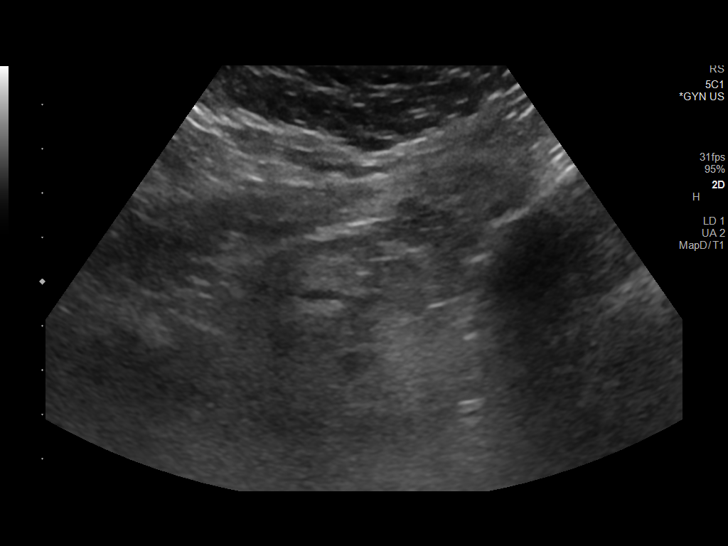
[im 54/107]
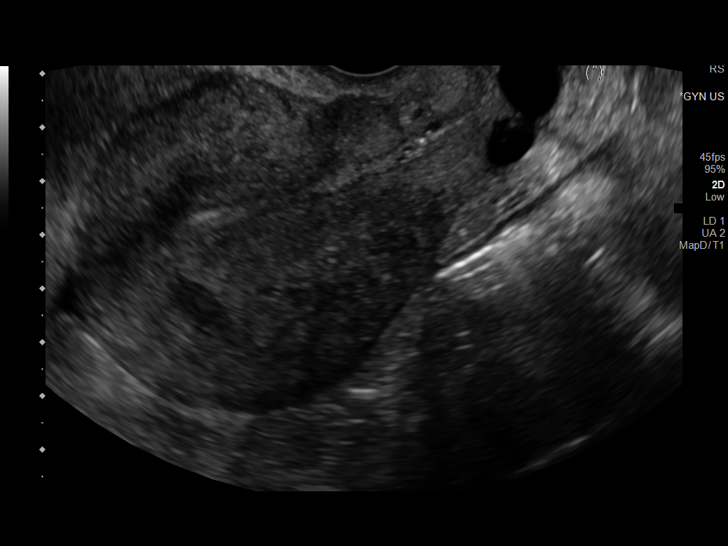
[im 62/107]
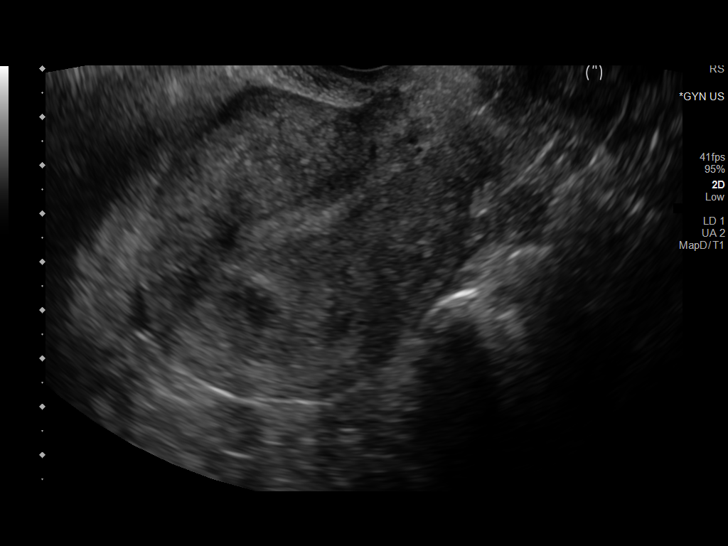
[im 71/107]
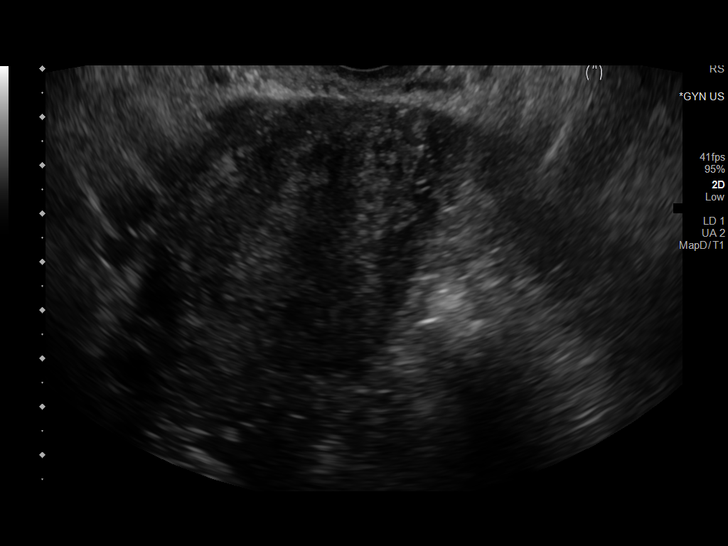
[im 80/107]
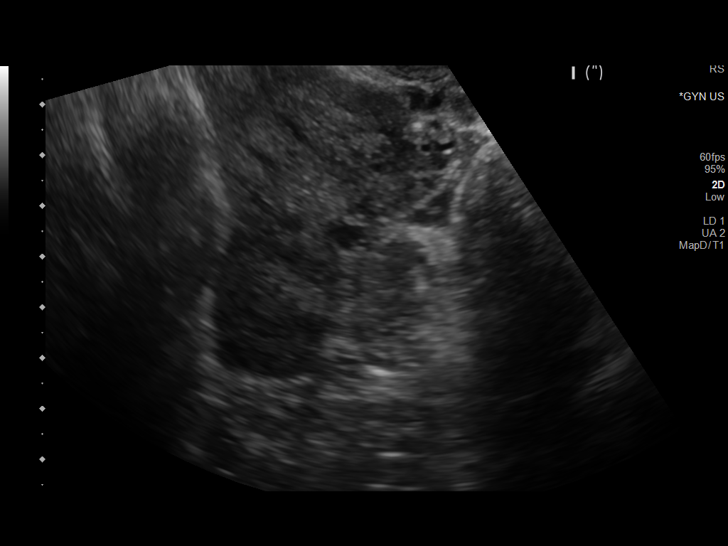
[im 89/107]
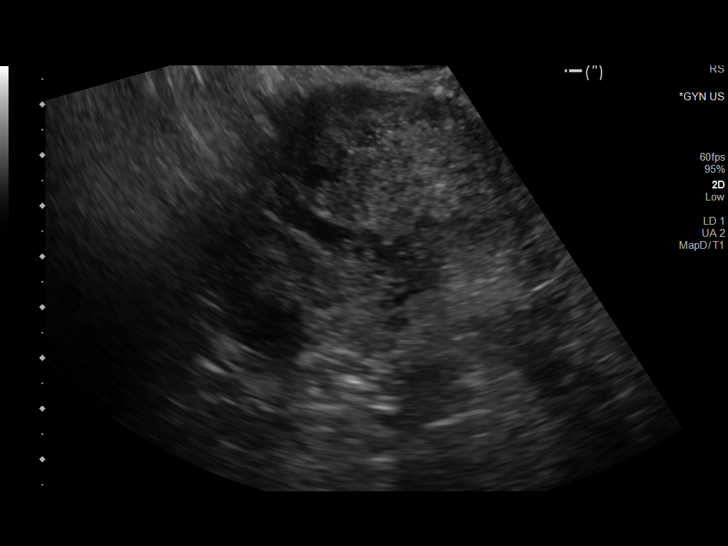
[im 98/107]
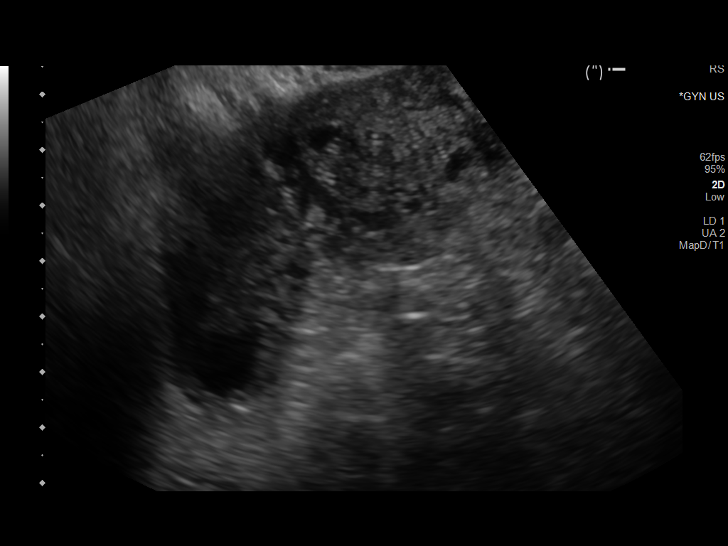
[im 107/107]
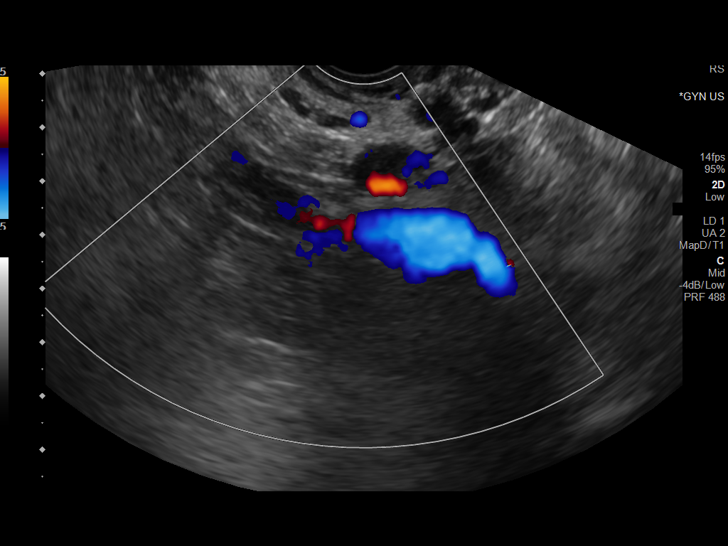

[13 of 25 positions shown; findings below may reference images not displayed]

FINDINGS: Uterus

Measurements: 10.8 x 6.0 x 6.5 cm = volume: 218.7 mL. Uterus is
anteverted with heterogeneous echotexture of the myometrium. 1.4 x
1.3 x 2.0 cm subserosal fibroid present at the mid anterior uterine
body. Additional 1.7 x 0.8 x 1.3 cm intramural fibroid present at
the left posterior uterine body. Few small nabothian cysts noted
about the cervix.

Endometrium

Thickness: 10 mm.  No focal abnormality visualized.

Right ovary

Measurements: 3.4 x 2.6 x 2.7 cm = volume: 12.5 mL. Normal
appearance/no adnexal mass.

Left ovary

Measurements: 5.4 x 2.9 x 3.0 cm = volume: 24.5 mL. Normal
appearance/no adnexal mass.

Other findings

No abnormal free fluid.
IMPRESSION: 1. Fibroid uterus as detailed above.
2. Otherwise unremarkable and normal pelvic ultrasound. No other
findings to explain patient's symptoms identified.

## 2021-01-29 ENCOUNTER — Other Ambulatory Visit: Payer: Self-pay | Admitting: Family Medicine

## 2021-03-26 ENCOUNTER — Ambulatory Visit (INDEPENDENT_AMBULATORY_CARE_PROVIDER_SITE_OTHER): Payer: Medicaid Other | Admitting: Family Medicine

## 2021-03-26 ENCOUNTER — Other Ambulatory Visit: Payer: Self-pay

## 2021-03-26 VITALS — BP 136/112 | HR 96 | Ht 63.0 in | Wt 238.6 lb

## 2021-03-26 DIAGNOSIS — A599 Trichomoniasis, unspecified: Secondary | ICD-10-CM

## 2021-03-26 DIAGNOSIS — M545 Low back pain, unspecified: Secondary | ICD-10-CM | POA: Diagnosis not present

## 2021-03-26 DIAGNOSIS — M549 Dorsalgia, unspecified: Secondary | ICD-10-CM

## 2021-03-26 HISTORY — DX: Trichomoniasis, unspecified: A59.9

## 2021-03-26 LAB — POCT URINALYSIS DIP (MANUAL ENTRY)
Bilirubin, UA: NEGATIVE
Blood, UA: NEGATIVE
Glucose, UA: NEGATIVE mg/dL
Ketones, POC UA: NEGATIVE mg/dL
Nitrite, UA: NEGATIVE
Protein Ur, POC: NEGATIVE mg/dL
Spec Grav, UA: 1.025 (ref 1.010–1.025)
Urobilinogen, UA: 0.2 E.U./dL
pH, UA: 6 (ref 5.0–8.0)

## 2021-03-26 LAB — POCT UA - MICROSCOPIC ONLY
Epithelial cells, urine per micros: >20
Trichomonas, UA: POSITIVE

## 2021-03-26 LAB — POCT URINE PREGNANCY: Preg Test, Ur: NEGATIVE

## 2021-03-26 MED ORDER — METRONIDAZOLE 500 MG PO TABS: 500.0000 mg | ORAL_TABLET | Freq: Two times a day (BID) | ORAL | 0 refills | Status: AC

## 2021-03-26 MED ORDER — DICLOFENAC SODIUM 1 % EX GEL: 2.0000 g | Freq: Four times a day (QID) | CUTANEOUS | 1 refills | Status: DC

## 2021-03-26 NOTE — Progress Notes (Signed)
SUBJECTIVE:   CHIEF COMPLAINT / HPI:   Urinary complaints - reports suprapubic pressure after urination x2 days - no frank abdominal pain - no dysuria - lower back pain, wraps around hips - no difficulty emptying bladder, no increased frequency - no fever or chills  Back pain, left leg pain - no radiographic examination of her spine on file in our system - describes as bad ache - bilateral hips and legs - one week duration, lasts all day - worst getting up out of bed after sleeping - relief with aleve - has not yet tried heating pads, tylenol, etc - feels like everything is stiff - no sharp pain down legs - subjective weakness in left leg, feels like it may give out (but hasn't) - no falls, difficulty with gait or walking - no bowel or bladder incontinence - little bit of numbness in saddle distribution - also associated with exhaustion - tried some potassium pills that gave initial improvement but then stopped working - does have potassium PO, patient takes it PRN when she gets leg cramping   PERTINENT  PMH / PSH:  Patient Active Problem List   Diagnosis Date Noted   Trichomonas infection 03/26/2021   Low back ache 03/26/2021   Heart palpitations 10/14/2020   Abdominal pain 01/01/2020   Left lower quadrant abdominal pain 10/20/2018   Constipation 10/25/2017   Right facial swelling 08/12/2017   Pseudotumor cerebri 07/28/2017   Subclinical hypothyroidism 06/21/2017   Blurry vision 05/17/2017   Benign paroxysmal positional vertigo 01/01/2017   Hypokalemia 08/28/2016   Generalized anxiety disorder 06/15/2016   Post concussive syndrome 05/18/2016   Palpitations 04/12/2015   Headache 07/24/2014   Vitamin D deficiency 07/24/2014   Fatigue 06/06/2014   Chest discomfort 06/06/2014   Depression 05/01/2013   Obesity (BMI 30-39.9) 12/15/2012   Myalgia 12/15/2012   POLYCYSTIC OVARY 05/27/2006   Primary hypertension 05/27/2006   MENSTRUATION, PAINFUL 05/27/2006    HIRSUTISM 05/27/2006    OBJECTIVE:   BP (!) 136/112    Pulse 96    Ht 5\' 3"  (1.6 m)    Wt 238 lb 9.6 oz (108.2 kg)    LMP 03/19/2021 (Approximate)    SpO2 100%    BMI 42.27 kg/m   PHQ-9:  Depression screen [facility] 2/9 03/26/2021 04/16/2020 10/20/2018  Decreased Interest 0 0 0  Down, Depressed, Hopeless 0 0 0  PHQ - 2 Score 0 0 0  Altered sleeping 0 - -  Tired, decreased energy 2 - -  Change in appetite 0 - -  Feeling bad or failure about yourself  0 - -  Trouble concentrating 0 - -  Moving slowly or fidgety/restless 0 - -  Suicidal thoughts 0 - -  PHQ-9 Score 2 - -     GAD-7:  GAD 7 : Generalized Anxiety Score 01/27/2017  Nervous, Anxious, on Edge 2  Control/stop worrying 1  Worry too much - different things 1  Trouble relaxing 2  Restless 0  Easily annoyed or irritable 0  Afraid - awful might happen 2  Total GAD 7 Score 8  Anxiety Difficulty Very difficult     Physical Exam General: Awake, alert, oriented, no acute distress Respiratory: Unlabored respirations, speaking in full sentences, no respiratory distress Extremities: Moving all extremities spontaneously MSK: No TTP over spinous processes or paraspinal regions GU: No CVA tenderness Neuro: Cranial nerves II through X grossly intact, hip/knee/ankle strength 5/5 and equal bilaterally Psych: Normal insight and judgement  ASSESSMENT/PLAN:  Trichomonas infection Acute.  Collected urine today as suspicion for UTI.  Urine sample was positive for trichomonas.  Rx metronidazole 5 mg twice daily x5 days sent.  Also provided patient with paper copy of EPT script for partner.  Return in 3 months for retesting.  Low back ache Acute, x1 week, associated with aching of bilateral legs.  No red flag symptoms.  Recommend conservative measures, provided with [facility] low back pain exercises patient handout, Salonpas sample.  See AVS for more.     Ezequiel Essex, MD [place]

## 2021-03-26 NOTE — Patient Instructions (Signed)
It was wonderful to meet you today. Thank you for allowing me to be a part of your care. Below is a short summary of what we discussed at your visit today:  Back pain Alternate Tylenol and Aleve as needed for acute pain Try heating pad for relief See attached reference for effective back stretching You may use over-the-counter pain relief patches like Salonpas or lidocaine patches Try the Voltaren gel, apply 3-4 times daily over painful areas, use at least 4 to 5 days in a row to start seeing relief  Reasons to come back to the clinic: If your pain continues to worsen and starts to affect your daily activities  Reasons to go to the hospital: If you develop bowel or bladder incontinence, severe numbness or tingling in your bottom, or inability to walk  Urine complaint You had white blood cells in your urine, which can indicate an infection.  Your urine sample was also positive for trichomonas.  This is considered a sexually transmitted infection.  I have sent in a proper medication for this, metronidazole.  I have also sent a prescription for your partner to treat him to prevent reinfection of your self.    Please bring all of your medications to every appointment!  If you have any questions or concerns, please do not hesitate to contact us via phone or MyChart message.   Ezequiel Essex, MD

## 2021-03-26 NOTE — Assessment & Plan Note (Signed)
Acute.  Collected urine today as suspicion for UTI.  Urine sample was positive for trichomonas.  Rx metronidazole 5 mg twice daily x5 days sent.  Also provided patient with paper copy of EPT script for partner.  Return in 3 months for retesting.

## 2021-03-26 NOTE — Assessment & Plan Note (Signed)
Acute, x1 week, associated with aching of bilateral legs.  No red flag symptoms.  Recommend conservative measures, provided with [facility] low back pain exercises patient handout, Salonpas sample.  See AVS for more.

## 2021-04-08 DIAGNOSIS — R300 Dysuria: Secondary | ICD-10-CM | POA: Diagnosis not present

## 2021-05-14 ENCOUNTER — Other Ambulatory Visit: Payer: Self-pay

## 2021-05-14 DIAGNOSIS — L309 Dermatitis, unspecified: Secondary | ICD-10-CM

## 2021-05-14 MED ORDER — TRIAMCINOLONE ACETONIDE 0.5 % EX OINT: 1.0000 "application " | TOPICAL_OINTMENT | Freq: Two times a day (BID) | CUTANEOUS | 3 refills | Status: DC

## 2021-06-19 DIAGNOSIS — Z202 Contact with and (suspected) exposure to infections with a predominantly sexual mode of transmission: Secondary | ICD-10-CM | POA: Diagnosis not present

## 2021-06-19 DIAGNOSIS — B009 Herpesviral infection, unspecified: Secondary | ICD-10-CM | POA: Diagnosis not present

## 2021-06-19 DIAGNOSIS — E282 Polycystic ovarian syndrome: Secondary | ICD-10-CM | POA: Diagnosis not present

## 2021-06-19 DIAGNOSIS — Z6841 Body Mass Index (BMI) 40.0 and over, adult: Secondary | ICD-10-CM | POA: Diagnosis not present

## 2021-06-19 DIAGNOSIS — Z113 Encounter for screening for infections with a predominantly sexual mode of transmission: Secondary | ICD-10-CM | POA: Diagnosis not present

## 2021-07-28 ENCOUNTER — Other Ambulatory Visit: Payer: Self-pay | Admitting: Family Medicine

## 2021-07-28 NOTE — Telephone Encounter (Signed)
Please let patient know I am refilling this medication, but she needs to schedule an appointment with me.   Thanks, Tarisha Fader J Ki Luckman, MD  

## 2021-08-01 ENCOUNTER — Other Ambulatory Visit: Payer: Self-pay | Admitting: Family Medicine

## 2021-08-04 ENCOUNTER — Ambulatory Visit (INDEPENDENT_AMBULATORY_CARE_PROVIDER_SITE_OTHER): Payer: Medicaid Other | Admitting: Family Medicine

## 2021-08-04 VITALS — BP 151/114 | HR 90 | Wt 229.6 lb

## 2021-08-04 DIAGNOSIS — I10 Essential (primary) hypertension: Secondary | ICD-10-CM

## 2021-08-04 DIAGNOSIS — R300 Dysuria: Secondary | ICD-10-CM

## 2021-08-04 DIAGNOSIS — M549 Dorsalgia, unspecified: Secondary | ICD-10-CM | POA: Insufficient documentation

## 2021-08-04 LAB — POCT UA - MICROSCOPIC ONLY
RBC, Urine, Miroscopic: NONE SEEN (ref 0–2)
WBC, Ur, HPF, POC: NONE SEEN (ref 0–5)

## 2021-08-04 MED ORDER — AMLODIPINE BESYLATE 10 MG PO TABS: ORAL_TABLET | ORAL | 0 refills | Status: DC

## 2021-08-04 MED ORDER — HYDROCHLOROTHIAZIDE 25 MG PO TABS: ORAL_TABLET | ORAL | 0 refills | Status: DC

## 2021-08-04 NOTE — Assessment & Plan Note (Addendum)
-  acute in nature, unsure of etiology but awaiting UA and urine culture. Possibly could be MSK however physical exam unremarkable. Does not appear to be due to sciatica or pyelonephritis.  ?-Given patient had exact symptoms last time she had trich, awaiting UA results. I discussed with patient that the best way to test for this would be a wet prep, she politely declines and says that this is how she was tested for it previously and would like a UA only. Encouraged her to go see her gynecologist for an STI check, she agreed. Will notify patient of results, instructed to refrain from sexual activity until 7 days after treatment should she have trich and to wait until results return.  ?

## 2021-08-04 NOTE — Assessment & Plan Note (Signed)
-  BP 129/116, on repeat 151/114 ?-she has not taken her medications recently ?-refills on HCTZ and amlodipine sent to desired pharmacy  ?

## 2021-08-04 NOTE — Patient Instructions (Signed)
It was great seeing you today! ? ?Today we discussed your pain and your blood pressure. I have refilled both of your blood pressure medications. I think you may have a urinary tract infection, I will let you know of any abnormal results from the testing today.  ? ?Please follow up at your next scheduled appointment, if anything arises between now and then, please don't hesitate to contact our office. ? ? ?Thank you for allowing [place] to be a part of your medical care! ? ?Thank you, ?Dr. Larae Grooms  ?

## 2021-08-04 NOTE — Progress Notes (Signed)
? ? ?  SUBJECTIVE:  ? ?CHIEF COMPLAINT / HPI:  ? ?Patient presents with back pain that radiates to her hips and legs bilaterally. She has had very similar symptoms before and at that time she tested positive for trichimonas 4 months ago so she would like to rule this out. Reports that she did complete treatment at the time. Sexually active, denies any new partners. Reports dysuria when her symptoms started 3-4 days ago but now is endorsing. Denies fever, chills, weakness and pelvic pain.  ? ?Hypertension ?Compliant on amlodipine 10 mg and HCTZ 25 mg daily. She says her PCP accidentally called in the wrong medication and her pharmacy gave her an emergency supply for a few days so she has not taken any of her medications for the last 5 days. This morning it was 160/100s, she feels like she has had intermittent headaches. Otherwise she remains compliant with her medications. Denies chest pain, dyspnea vision changes and weakness.  ? ?OBJECTIVE:  ? ?BP (!) 151/114   Pulse 90   Wt 229 lb 9.6 oz (104.1 kg)   LMP  (LMP Unknown)   SpO2 100%   BMI 40.67 kg/m?   ?General: Patient well-appearing, in no acute distress. ?CV: RRR, no murmurs or gallops auscultated ?Resp: CTAB, no wheezing, rales or rhonchi noted ?Abdomen: soft, nontender, nondistended, presence of bowel sounds ?MSK: negative CVA tenderness bilaterally, negative straight leg raise bilaterally, no point tenderness along back or lower extremities bilaterally  ?Neuro: gross sensation intact bilaterally, 5/5 LE strength bilaterally, normal gait  ? ?ASSESSMENT/PLAN:  ? ?Primary hypertension ?-BP 129/116, on repeat 151/114 ?-she has not taken her medications recently ?-refills on HCTZ and amlodipine sent to desired pharmacy  ? ?Back pain ?-acute in nature, unsure of etiology but awaiting UA and urine culture. Possibly could be MSK however physical exam unremarkable. Does not appear to be due to sciatica or pyelonephritis.  ?-Given patient had exact symptoms last  time she had trich, awaiting UA results. I discussed with patient that the best way to test for this would be a wet prep, she politely declines and says that this is how she was tested for it previously and would like a UA only. Encouraged her to go see her gynecologist for an STI check, she agreed. Will notify patient of results, instructed to refrain from sexual activity until 7 days after treatment should she have trich and to wait until results return.  ? ? ? ?Donney Dice, DO ?[place]  ?

## 2021-08-05 ENCOUNTER — Other Ambulatory Visit: Payer: Self-pay | Admitting: Family Medicine

## 2021-08-05 ENCOUNTER — Telehealth: Payer: Self-pay | Admitting: Family Medicine

## 2021-08-05 DIAGNOSIS — N390 Urinary tract infection, site not specified: Secondary | ICD-10-CM

## 2021-08-05 MED ORDER — NITROFURANTOIN MONOHYD MACRO 100 MG PO CAPS: 100.0000 mg | ORAL_CAPSULE | Freq: Two times a day (BID) | ORAL | 0 refills | Status: AC

## 2021-08-05 MED ORDER — CEPHALEXIN 500 MG PO CAPS: 500.0000 mg | ORAL_CAPSULE | Freq: Two times a day (BID) | ORAL | 0 refills | Status: DC

## 2021-08-05 NOTE — Telephone Encounter (Signed)
Patient came in stating that she would like to talk to someone about test results she she got yesterday. Best contact number is [phone]. ?

## 2021-08-05 NOTE — Telephone Encounter (Signed)
Patient contacted.  ? ?Results explained to patient and the need to start [place].  ? ?Patient voiced understanding.  ?

## 2021-08-06 LAB — URINE CULTURE

## 2021-08-14 ENCOUNTER — Ambulatory Visit: Payer: Medicaid Other | Admitting: Family Medicine

## 2021-09-02 ENCOUNTER — Encounter: Payer: Self-pay | Admitting: *Deleted

## 2021-10-26 ENCOUNTER — Other Ambulatory Visit: Payer: Self-pay | Admitting: Family Medicine

## 2021-10-27 NOTE — Telephone Encounter (Signed)
Please let patient know I am refilling this medication, but she needs to schedule an appointment with me.   Thanks, Jelisha Weed J Sherly Brodbeck, MD  

## 2021-12-23 ENCOUNTER — Other Ambulatory Visit: Payer: Self-pay | Admitting: Family Medicine

## 2021-12-23 DIAGNOSIS — I10 Essential (primary) hypertension: Secondary | ICD-10-CM

## 2021-12-29 ENCOUNTER — Ambulatory Visit: Payer: Medicaid Other | Admitting: Family Medicine

## 2022-01-22 ENCOUNTER — Ambulatory Visit: Payer: Medicaid Other | Admitting: Family Medicine

## 2022-02-10 ENCOUNTER — Ambulatory Visit (INDEPENDENT_AMBULATORY_CARE_PROVIDER_SITE_OTHER): Payer: Medicaid Other | Admitting: Family Medicine

## 2022-02-10 ENCOUNTER — Encounter: Payer: Self-pay | Admitting: Family Medicine

## 2022-02-10 VITALS — BP 155/104 | HR 98 | Temp 99.7°F | Ht 63.0 in | Wt 241.0 lb

## 2022-02-10 DIAGNOSIS — Z20822 Contact with and (suspected) exposure to covid-19: Secondary | ICD-10-CM | POA: Diagnosis not present

## 2022-02-10 DIAGNOSIS — R0981 Nasal congestion: Secondary | ICD-10-CM

## 2022-02-10 DIAGNOSIS — J101 Influenza due to other identified influenza virus with other respiratory manifestations: Secondary | ICD-10-CM

## 2022-02-10 DIAGNOSIS — I10 Essential (primary) hypertension: Secondary | ICD-10-CM

## 2022-02-10 LAB — POCT INFLUENZA A/B
Influenza A, POC: NEGATIVE
Influenza B, POC: POSITIVE — AB

## 2022-02-10 MED ORDER — OSELTAMIVIR PHOSPHATE 75 MG PO CAPS: 75.0000 mg | ORAL_CAPSULE | Freq: Two times a day (BID) | ORAL | 0 refills | Status: AC

## 2022-02-10 NOTE — Patient Instructions (Addendum)
It was nice seeing you today!  I recommend Tylenol and/or ibuprofen as needed for fever and body aches.  I recommend honey with or without tea for cough.  You should quarantine for 5 days.  If your COVID test is positive, you can still return to work after the 5-day quarantine but you will need to wear a mask.  Stay well, Zola Button, MD [place] [phone]  --  Make sure to check out at the front desk before you leave today.  Please arrive at least 15 minutes prior to your scheduled appointments.  If you had blood work today, I will send you a MyChart message or a letter if results are normal. Otherwise, I will give you a call.  If you had a referral placed, they will call you to set up an appointment. Please give [place] a call if you don't hear back in the next 2 weeks.  If you need additional refills before your next appointment, please call your pharmacy first.

## 2022-02-10 NOTE — Progress Notes (Signed)
    SUBJECTIVE:   CHIEF COMPLAINT / HPI:  Chief Complaint  Patient presents with   cold smxs   Sore Throat    Patient started feeling ill about 2 days ago with symptoms of fever up to 102F, chills, productive cough of thick white sputum, headaches, SOB mostly when walking, body aches, abdominal pain, sore throat, decreased appetite. She has had some chest discomfort noticed when coughing (non-pleuritic) and back discomfort. Denies diarrhea. No sick contacts. Taking Tylenol PM at home. Home COVID test negative.  She reports she did not take her blood pressure medications today.  She works as a Magazine features editor.  PERTINENT  PMH / PSH: Obesity, hypertension  Patient Care Team: Leeanne Rio, MD as PCP - General (Pediatrics) Danice Goltz, MD as Consulting Physician (Ophthalmology)   OBJECTIVE:   BP (!) 155/104   Pulse 98   Temp 99.7 F (37.6 C)   Ht '5\' 3"'$  (1.6 m)   Wt 241 lb (109.3 kg)   LMP 01/29/2022   SpO2 100%   BMI 42.69 kg/m   Physical Exam Constitutional:      General: She is not in acute distress.    Appearance: She is well-developed. She is obese.  HENT:     Head: Normocephalic and atraumatic.     Mouth/Throat:     Mouth: Mucous membranes are moist.     Pharynx: Oropharynx is clear. No oropharyngeal exudate or posterior oropharyngeal erythema.  Cardiovascular:     Rate and Rhythm: Normal rate and regular rhythm.  Pulmonary:     Effort: Pulmonary effort is normal. No respiratory distress.     Breath sounds: Normal breath sounds.  Musculoskeletal:     Cervical back: Neck supple.  Neurological:     Mental Status: She is alert.         02/10/2022    3:51 PM  Depression screen PHQ 2/9  Decreased Interest 0  Down, Depressed, Hopeless 0  PHQ - 2 Score 0  Altered sleeping 1  Tired, decreased energy 2  Change in appetite 2  Feeling bad or failure about yourself  0  Trouble concentrating 0  Suicidal thoughts 0  PHQ-9  Score 5  Difficult doing work/chores Somewhat difficult     {Show previous vital signs (optional):23777}    ASSESSMENT/PLAN:   Influenza B Viral prodrome of symptoms ongoing for 2 days with positive influenza B test.  Could consider pneumonia with fever and dyspnea, though maintaining good oxygen saturation and lungs are clear on exam. - oseltamivir - supportive care - discussed return precautions - Covid test obtained, pending - work note provided, may return 11/17  Primary hypertension Elevated today as she did not take her medications today and in the setting of acute illness.  Advised to follow-up in 1 month with PCP.    Return in about 4 weeks (around 03/10/2022), or if symptoms worsen or fail to improve, for f/u HTN.   Zola Button, MD [place]

## 2022-02-10 NOTE — Assessment & Plan Note (Signed)
Elevated today as she did not take her medications today and in the setting of acute illness.  Advised to follow-up in 1 month with PCP.

## 2022-02-11 LAB — NOVEL CORONAVIRUS, NAA: SARS-CoV-2, NAA: DETECTED — AB

## 2022-04-14 ENCOUNTER — Other Ambulatory Visit: Payer: Self-pay | Admitting: Family Medicine

## 2022-04-14 DIAGNOSIS — I10 Essential (primary) hypertension: Secondary | ICD-10-CM

## 2022-04-14 NOTE — Telephone Encounter (Signed)
Please let patient know I am refilling this medication, but she needs to schedule an appointment with me.   Thanks, Leeanne Rio, MD

## 2022-04-16 NOTE — Telephone Encounter (Signed)
Called and lvm for patient to call back and schedule appointment. Also informed medication was refilled.

## 2022-05-05 ENCOUNTER — Other Ambulatory Visit: Payer: Self-pay | Admitting: Family Medicine

## 2022-05-05 DIAGNOSIS — I10 Essential (primary) hypertension: Secondary | ICD-10-CM

## 2022-06-01 ENCOUNTER — Telehealth: Payer: Self-pay | Admitting: Family Medicine

## 2022-06-01 NOTE — Telephone Encounter (Signed)
I am willing to prescribe it but I am not sure that her insurance will cover it. I would need to see her in person for a visit to discuss first. She's only been here for acute issues for the past 2 years - needs routinely scheduled visit to discuss chronic medical issues.  Leeanne Rio, MD

## 2022-06-01 NOTE — Telephone Encounter (Signed)
Patient came stating that she would like to start taking ozempic, asking if her doctor could call her and let her know if that is something that she would be willing to prescribe. Dr. Ardelia Mems didn't have any open appts until the end of the month.

## 2022-06-02 NOTE — Telephone Encounter (Signed)
Pt scheduled for the end of the month. Xylon Croom Kennon Holter, CMA

## 2022-06-25 ENCOUNTER — Ambulatory Visit (INDEPENDENT_AMBULATORY_CARE_PROVIDER_SITE_OTHER): Payer: Medicaid Other | Admitting: Family Medicine

## 2022-06-25 ENCOUNTER — Encounter: Payer: Self-pay | Admitting: Family Medicine

## 2022-06-25 VITALS — BP 158/98 | HR 74 | Ht 63.0 in | Wt 246.0 lb

## 2022-06-25 DIAGNOSIS — E669 Obesity, unspecified: Secondary | ICD-10-CM

## 2022-06-25 DIAGNOSIS — I10 Essential (primary) hypertension: Secondary | ICD-10-CM | POA: Diagnosis not present

## 2022-06-25 DIAGNOSIS — E038 Other specified hypothyroidism: Secondary | ICD-10-CM | POA: Diagnosis not present

## 2022-06-25 DIAGNOSIS — G932 Benign intracranial hypertension: Secondary | ICD-10-CM | POA: Diagnosis not present

## 2022-06-25 LAB — POCT GLYCOSYLATED HEMOGLOBIN (HGB A1C): Hemoglobin A1C: 5.4 % (ref 4.0–5.6)

## 2022-06-25 NOTE — Assessment & Plan Note (Addendum)
No longer taking topamax.  She is planning to follow-up with ophthalmology and neurology.  Would benefit from weight loss, unfortunately Medicaid does not cover GLP-1's for weight loss at this time.  Given info on healthy weight and wellness clinic.

## 2022-06-25 NOTE — Patient Instructions (Addendum)
It was great to see you again today.  Checking labs today I'm sorry medicaid won't cover ozempic Schedule nurse visit in a couple weeks to recheck blood pressure   Call the [facility] to set up an appointment for weight management. Their number is [phone].   Be well, Dr. Ardelia Mems

## 2022-06-25 NOTE — Assessment & Plan Note (Addendum)
Unfortunately Medicaid does not cover GLP-1's for weight loss.  A1c checked today and is normal.  Discussed with patient that it is possible that Medicaid will one day cover GLP-1's for cardiovascular risk reduction as Medicare has recently announced they will cover it for this purpose.  In the interim she will call the healthy weight and wellness clinic for an appointment and continue to work on lifestyle changes.

## 2022-06-25 NOTE — Assessment & Plan Note (Addendum)
Update TSH today.  Titrate levothyroxine based on TSH result.

## 2022-06-25 NOTE — Progress Notes (Signed)
    SUBJECTIVE:   CHIEF COMPLAINT / HPI:   Anna Anderson is a 42yo F with PMH of Pseudotumor ceribri, PCOS, and obesity who presents to clinic inquiring about starting Ozempic for weight loss. She says that she has gained weight recently. She believes that losing weight could help her headaches. She has tried topirimate for the headaches but did not like the side effects and has stopped taking it. She walks a lot for her job but acknowledges that she does not eat the healthiest diet. She also says that her energy has been low recently and has been dealing with several stressors.  Hypertension: Currently taking HCTZ 25 mg daily and amlodipine 10 mg daily.  Tolerating these well.  Reports blood pressure has been high due to family related stress.  She has not been sleeping well while her mom has been in the hospital.  Subclinical hypothyroidism: Currently taking Synthroid 50 mcg daily.  Due for TSH.  PERTINENT  PMH / PSH:  Pseudotumor cerebri PCOS Obesity HTN Hypothyroidism  OBJECTIVE:   BP (!) 158/98   Pulse 74   Ht 5\' 3"  (1.6 m)   Wt 246 lb (111.6 kg)   LMP 06/20/2022   SpO2 98%   BMI 43.58 kg/m   GEN: No acute distress, pleasant, cooperative CV: normal exam, regular RR, no MRG Pulm: Normal WOB, normal breath sounds Extremities: No edema  ASSESSMENT/PLAN:   Primary hypertension BP is elevated today,, including on recheck.  Likely due to lack of sleep and acute stressors.  Will recheck in a few weeks before making medication changes.  Patient instructed to schedule nurse visit.  Subclinical hypothyroidism Update TSH today.  Titrate levothyroxine based on TSH result.  Pseudotumor cerebri No longer taking topamax.  She is planning to follow-up with ophthalmology and neurology.  Would benefit from weight loss, unfortunately Medicaid does not cover GLP-1's for weight loss at this time.  Given info on healthy weight and wellness clinic.  Obesity (BMI 30-39.9) Unfortunately  Medicaid does not cover GLP-1's for weight loss.  A1c checked today and is normal.  Discussed with patient that it is possible that Medicaid will one day cover GLP-1's for cardiovascular risk reduction as Medicare has recently announced they will cover it for this purpose.  In the interim she will call the healthy weight and wellness clinic for an appointment and continue to work on lifestyle changes.   Lamar Laundry, medical student [place]   Patient seen along with medical student Will Dabbs. I personally evaluated this patient along with the student, and verified all aspects of the history, physical exam, and medical decision making as documented by the student. I agree with the student's documentation and have made all necessary edits.  Chrisandra Netters, MD  [place]

## 2022-06-25 NOTE — Assessment & Plan Note (Addendum)
BP is elevated today,, including on recheck.  Likely due to lack of sleep and acute stressors.  Will recheck in a few weeks before making medication changes.  Patient instructed to schedule nurse visit.

## 2022-06-26 LAB — CMP14+EGFR
ALT: 31 IU/L (ref 0–32)
AST: 20 IU/L (ref 0–40)
Albumin/Globulin Ratio: 1.4 (ref 1.2–2.2)
Albumin: 4.5 g/dL (ref 3.9–4.9)
Alkaline Phosphatase: 83 IU/L (ref 44–121)
BUN/Creatinine Ratio: 16 (ref 9–23)
BUN: 12 mg/dL (ref 6–24)
Bilirubin Total: 0.2 mg/dL (ref 0.0–1.2)
CO2: 21 mmol/L (ref 20–29)
Calcium: 9.2 mg/dL (ref 8.7–10.2)
Chloride: 103 mmol/L (ref 96–106)
Creatinine, Ser: 0.75 mg/dL (ref 0.57–1.00)
Globulin, Total: 3.3 g/dL (ref 1.5–4.5)
Glucose: 85 mg/dL (ref 70–99)
Potassium: 4.2 mmol/L (ref 3.5–5.2)
Sodium: 138 mmol/L (ref 134–144)
Total Protein: 7.8 g/dL (ref 6.0–8.5)
eGFR: 103 mL/min/{1.73_m2} (ref >59)

## 2022-06-26 LAB — LIPID PANEL
Chol/HDL Ratio: 5.2 ratio — ABNORMAL HIGH (ref 0.0–4.4)
Cholesterol, Total: 203 mg/dL — ABNORMAL HIGH (ref 100–199)
HDL: 39 mg/dL — ABNORMAL LOW (ref >39)
LDL Chol Calc (NIH): 138 mg/dL — ABNORMAL HIGH (ref 0–99)
Triglycerides: 141 mg/dL (ref 0–149)
VLDL Cholesterol Cal: 26 mg/dL (ref 5–40)

## 2022-06-26 LAB — TSH: TSH: 4.04 u[IU]/mL (ref 0.450–4.500)

## 2022-07-01 ENCOUNTER — Telehealth: Payer: Self-pay

## 2022-07-01 ENCOUNTER — Other Ambulatory Visit: Payer: Self-pay | Admitting: Family Medicine

## 2022-07-01 DIAGNOSIS — L309 Dermatitis, unspecified: Secondary | ICD-10-CM

## 2022-07-01 NOTE — Telephone Encounter (Signed)
Patient calls nurse line regarding weight management medication. She states that she was told that she would not be able to get Ozempic. She is asking if she could be prescribed phentermine as an alternative.   Will forward request to PCP.   Talbot Grumbling, RN

## 2022-07-03 NOTE — Telephone Encounter (Signed)
I don't prescribe phentermine - it has potential side effects that are not worth it (like raised blood pressure) and can only be taken for 12 weeks. Please let patient know. I wish there was a better option for her.  Latrelle Dodrill, MD

## 2022-07-06 NOTE — Telephone Encounter (Signed)
Attempted to call patient to discuss.   However, VM left informing her to return my call.

## 2022-07-07 NOTE — Telephone Encounter (Signed)
Attempted to contact patient. She did not answer, LVM asking her to return call to provide with information from Dr. Pollie Meyer.   Veronda Prude, RN

## 2022-07-07 NOTE — Telephone Encounter (Signed)
Patient returns call to nurse line.   Patient advised PCP would not be prescribing Phentermine.   Patient would like a referral to [facility].   Will forward to PCP.

## 2022-07-07 NOTE — Telephone Encounter (Signed)
I don't think she needs a referral, she can just call the [facility] to set up an appointment for weight management. Their number is [phone].  I gave her the # at our recent visit but if you can give it again that would be great.  If she does indeed need a referral let me know and I can enter one. Thanks! Latrelle Dodrill, MD

## 2022-07-07 NOTE — Telephone Encounter (Signed)
Patient returns call to nurse line. Advised of message from Dr. Pollie Meyer. She will call the [facility] to schedule appointment.   Veronda Prude, RN

## 2022-10-07 ENCOUNTER — Other Ambulatory Visit: Payer: Self-pay | Admitting: Family Medicine

## 2022-10-07 DIAGNOSIS — I10 Essential (primary) hypertension: Secondary | ICD-10-CM

## 2022-10-12 ENCOUNTER — Telehealth: Payer: Self-pay | Admitting: Family Medicine

## 2022-10-12 NOTE — Telephone Encounter (Signed)
Patient walked in and requested a prescription for:   Name of Medication(s):  Contrave Last date of OV:  06/25/22 Pharmacy:  Same  Pt. Ph # [phone] Will route refill request to Clinic RN.  Discussed with patient policy to call pharmacy for future refills.  Also, discussed refills may take up to 48 hours to approve or deny.  Vilinda Blanks

## 2022-10-30 NOTE — Telephone Encounter (Signed)
Patient walked in checking on the status of message that she put in back on the 15th of July.  If there is any questions please call patient.    Please Advise.   Thanks!

## 2022-10-30 NOTE — Telephone Encounter (Signed)
Pt cam e to my desk asking about contrave and asking if she can take it/get you to prescribe it. Please advise. Melyssa Signor Bruna Potter, CMA

## 2022-11-02 ENCOUNTER — Telehealth: Payer: Self-pay

## 2022-11-02 ENCOUNTER — Telehealth: Payer: Self-pay | Admitting: Neurology

## 2022-11-02 DIAGNOSIS — G932 Benign intracranial hypertension: Secondary | ICD-10-CM | POA: Diagnosis not present

## 2022-11-02 NOTE — Telephone Encounter (Signed)
Pt asking for sooner due to Pseudotumor cerebri. Dr. Sherryll Burger will be sending over results. Could Dr. Terrace Arabia send in a prescription for Topamax. Would like a call back to discuss a work in appt.

## 2022-11-02 NOTE — Telephone Encounter (Signed)
Tried to call patient, call not going through. MyCHart message sent in regards to appt. Dr. Terrace Arabia agrees to see Wednesday 8/7 at 11am.

## 2022-11-03 ENCOUNTER — Encounter: Payer: Self-pay | Admitting: Family Medicine

## 2022-11-03 NOTE — Telephone Encounter (Signed)
Attempted to reach patient to discuss. No answer, Left HIPAA-compliant voicemail asking her to call back. She will need an appointment to discuss this in more detail. I recommend she schedules a follow up visit with me to discuss weight loss.   Medicaid is now covering medications like Wegovy, which I think would be better for her but we have to document she is being followed up for her weight.  If patient returns call, can you ask her to schedule with me? I will also send her a mychart message.  Thanks, Dr. Pollie Meyer

## 2022-11-04 ENCOUNTER — Ambulatory Visit: Payer: Medicaid Other | Admitting: Neurology

## 2022-11-04 ENCOUNTER — Encounter: Payer: Self-pay | Admitting: Neurology

## 2022-11-04 ENCOUNTER — Telehealth: Payer: Self-pay | Admitting: Neurology

## 2022-11-04 VITALS — BP 140/92 | HR 78 | Ht 61.0 in | Wt 249.5 lb

## 2022-11-04 DIAGNOSIS — G43709 Chronic migraine without aura, not intractable, without status migrainosus: Secondary | ICD-10-CM | POA: Diagnosis not present

## 2022-11-04 DIAGNOSIS — G932 Benign intracranial hypertension: Secondary | ICD-10-CM | POA: Diagnosis not present

## 2022-11-04 MED ORDER — SUMATRIPTAN SUCCINATE 100 MG PO TABS: 100.0000 mg | ORAL_TABLET | ORAL | 11 refills | Status: DC | PRN

## 2022-11-04 MED ORDER — TOPIRAMATE 100 MG PO TABS: 100.0000 mg | ORAL_TABLET | Freq: Two times a day (BID) | ORAL | 11 refills | Status: DC

## 2022-11-04 MED ORDER — ALPRAZOLAM 0.5 MG PO TABS: ORAL_TABLET | ORAL | 0 refills | Status: DC

## 2022-11-04 NOTE — Telephone Encounter (Signed)
[employer] medicaid Berkley Harvey: [id number] exp. 11/04/22-12/19/22 sent to GI [phone]

## 2022-11-04 NOTE — Telephone Encounter (Signed)
Team Member Role and Visual merchandiser Info Address Start End Comments  Anna Mocha, MD Consulting Physician (Ophthalmology) Phone: [phone] Fax: [phone] [address] [place] [address] 06/23/2017 - -   Get medical record from Dr. Sherryll Burger
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
## 2022-11-17 NOTE — Addendum Note (Signed)
Addended by: Levert Feinstein on: 11/17/2022 11:00 AM   Modules accepted: Orders

## 2022-11-21 ENCOUNTER — Ambulatory Visit
Admission: RE | Admit: 2022-11-21 | Discharge: 2022-11-21 | Disposition: A | Discharge status: Home / Self Care | Payer: Medicaid Other | Source: Ambulatory Visit | Attending: Neurology | Admitting: Neurology

## 2022-11-21 DIAGNOSIS — G932 Benign intracranial hypertension: Secondary | ICD-10-CM

## 2022-11-23 ENCOUNTER — Telehealth: Payer: Self-pay | Admitting: Neurology

## 2022-11-23 ENCOUNTER — Other Ambulatory Visit: Payer: Self-pay | Admitting: Family Medicine

## 2022-11-23 DIAGNOSIS — I10 Essential (primary) hypertension: Secondary | ICD-10-CM

## 2022-11-23 NOTE — Telephone Encounter (Signed)
  IMPRESSION: This MRI of the brain without contrast shows the following: 2-3 punctate T2/FLAIR hyperintense foci in the cerebral hemispheres.  This is a nonspecific finding and could be incidental or due to sequela of migraine or very minimal chronic microvascular ischemic change. The pituitary gland has a reduced height within a normal size sella turcica.  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  These findings could be incidental or related to a history of elevated intracranial pressure.  The pituitary gland appears unchanged compared to the CT scan from 2018. No acute findings.  On schedule for LP on August 27th 2024.

## 2022-11-24 ENCOUNTER — Inpatient Hospital Stay: Admission: RE | Admit: 2022-11-24 | Payer: Medicaid Other | Source: Ambulatory Visit

## 2022-11-24 ENCOUNTER — Ambulatory Visit: Admission: RE | Admit: 2022-11-24 | Payer: Medicaid Other | Source: Ambulatory Visit

## 2022-11-24 VITALS — BP 172/97 | HR 60

## 2022-11-24 DIAGNOSIS — G932 Benign intracranial hypertension: Secondary | ICD-10-CM

## 2022-11-24 DIAGNOSIS — G43709 Chronic migraine without aura, not intractable, without status migrainosus: Secondary | ICD-10-CM | POA: Diagnosis not present

## 2022-11-24 NOTE — Discharge Instructions (Signed)

## 2022-11-25 ENCOUNTER — Telehealth: Payer: Self-pay | Admitting: Neurology

## 2022-11-25 NOTE — Telephone Encounter (Signed)
error 

## 2022-11-26 ENCOUNTER — Telehealth: Payer: Self-pay

## 2022-11-26 DIAGNOSIS — G932 Benign intracranial hypertension: Secondary | ICD-10-CM

## 2022-11-26 DIAGNOSIS — G43709 Chronic migraine without aura, not intractable, without status migrainosus: Secondary | ICD-10-CM

## 2022-11-26 MED ORDER — BUTALBITAL-APAP-CAFFEINE 50-325-40 MG PO TABS: 1.0000 | ORAL_TABLET | Freq: Four times a day (QID) | ORAL | 3 refills | Status: DC | PRN

## 2022-11-26 NOTE — Telephone Encounter (Signed)
I called patient,   She had LP on August 27th 2024, open pressure 44 cm H2O.  She complains of frequent headaches despite increased water intake, frequent caffeine containing beverage, worsening headache standing up for 30 minutes, improved by lying flat,  Meds ordered this encounter  Medications   butalbital-acetaminophen-caffeine (FIORICET) 50-325-40 MG tablet    Sig: Take 1 tablet by mouth every 6 (six) hours as needed for headache.    Dispense:  30 tablet    Refill:  3       Work excuse until Sept 4th

## 2022-11-26 NOTE — Telephone Encounter (Signed)
Patient came into the office to inform the provider she had her lumbar puncture on Tuesday and she is having headaches now. Please advise

## 2022-11-26 NOTE — Addendum Note (Signed)
Addended by: Levert Feinstein on: 11/26/2022 04:25 PM   Modules accepted: Orders

## 2022-11-28 ENCOUNTER — Encounter: Payer: Self-pay | Admitting: Neurology

## 2022-12-01 NOTE — Telephone Encounter (Signed)
I failed to reach patient, left message for her to cal back,

## 2022-12-02 NOTE — Telephone Encounter (Signed)
Called and spoke to patient who reports she had body aches, a low grade fever and headaches throughout the weekend. She is now feeling much better with no headache and just wanting to check in to make sure this is normal and if she should change anything.

## 2022-12-17 ENCOUNTER — Ambulatory Visit: Payer: Medicaid Other | Admitting: Neurology

## 2022-12-18 ENCOUNTER — Telehealth: Payer: Self-pay | Admitting: Pharmacist

## 2022-12-18 NOTE — Telephone Encounter (Signed)
Attempted to contact patient for follow-up of blood pressure/hypertension control.   Left HIPAA compliant voice mail requesting call back to direct phone: [phone] for scheduling blood pressure visit with clinic pharmacist.   Total time with patient call and documentation of interaction: 3 minutes.

## 2022-12-24 LAB — FUNGUS CULTURE W SMEAR
CULTURE:: NO GROWTH
MICRO NUMBER:: 15387227
SMEAR:: NONE SEEN
SPECIMEN QUALITY:: ADEQUATE

## 2022-12-24 LAB — GLUCOSE, CSF: Glucose, CSF: 59 mg/dL (ref 40–80)

## 2022-12-24 LAB — GRAM STAIN
MICRO NUMBER:: 15387226
SPECIMEN QUALITY:: ADEQUATE

## 2022-12-24 LAB — CSF CELL COUNT WITH DIFFERENTIAL
RBC Count, CSF: 0 cells/uL
TOTAL NUCLEATED CELL: 0 cells/uL (ref 0–5)

## 2022-12-24 LAB — PROTEIN, CSF: Total Protein, CSF: 36 mg/dL (ref 15–45)

## 2022-12-24 LAB — VDRL, CSF: VDRL Quant, CSF: NONREACTIVE

## 2023-01-12 ENCOUNTER — Telehealth: Payer: Self-pay | Admitting: Neurology

## 2023-01-12 NOTE — Telephone Encounter (Signed)
LVM and sent mychart msg informing pt of need to reschedule 04/06/23 appt - NP out

## 2023-02-04 ENCOUNTER — Other Ambulatory Visit: Payer: Self-pay | Admitting: Family Medicine

## 2023-02-04 DIAGNOSIS — I10 Essential (primary) hypertension: Secondary | ICD-10-CM

## 2023-02-05 ENCOUNTER — Other Ambulatory Visit: Payer: Self-pay | Admitting: Family Medicine

## 2023-02-05 DIAGNOSIS — I10 Essential (primary) hypertension: Secondary | ICD-10-CM

## 2023-02-18 DIAGNOSIS — N898 Other specified noninflammatory disorders of vagina: Secondary | ICD-10-CM | POA: Diagnosis not present

## 2023-02-18 DIAGNOSIS — G932 Benign intracranial hypertension: Secondary | ICD-10-CM | POA: Diagnosis not present

## 2023-02-18 DIAGNOSIS — N760 Acute vaginitis: Secondary | ICD-10-CM | POA: Diagnosis not present

## 2023-02-18 DIAGNOSIS — B009 Herpesviral infection, unspecified: Secondary | ICD-10-CM | POA: Diagnosis not present

## 2023-03-01 ENCOUNTER — Other Ambulatory Visit: Payer: Self-pay | Admitting: Family Medicine

## 2023-03-17 ENCOUNTER — Encounter: Payer: Self-pay | Admitting: Neurology

## 2023-03-17 ENCOUNTER — Telehealth: Payer: Self-pay

## 2023-03-17 ENCOUNTER — Ambulatory Visit (INDEPENDENT_AMBULATORY_CARE_PROVIDER_SITE_OTHER): Payer: Medicaid Other | Admitting: Neurology

## 2023-03-17 VITALS — BP 157/101 | HR 72 | Ht 61.0 in | Wt 224.5 lb

## 2023-03-17 DIAGNOSIS — G43709 Chronic migraine without aura, not intractable, without status migrainosus: Secondary | ICD-10-CM

## 2023-03-17 DIAGNOSIS — G932 Benign intracranial hypertension: Secondary | ICD-10-CM | POA: Diagnosis not present

## 2023-03-17 DIAGNOSIS — R109 Unspecified abdominal pain: Secondary | ICD-10-CM

## 2023-03-17 NOTE — Telephone Encounter (Signed)
Pt states her entire stomach is hurting and she knows it has to do with the Topamax. Please when able return call to pt.

## 2023-03-17 NOTE — Telephone Encounter (Signed)
Patient called the phone lines are down she came into the lobby to see if she could get an appointment. Her original appointment cancelled 1/7 since Maralyn Sago is out of the office. She would like to speak to someone about her symptoms, with her stomach pain because this happened in the past and she has a headache. She never misses a does but thinks that something she eats sets off the pain. She would like to be seen today if possible.  [phone]
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
## 2023-03-17 NOTE — Telephone Encounter (Signed)
Call to patient, she reports stomach pain while taking the topamax, and this has happened previously while taking topamax. She is nauseated and vomiting describes pressure in her stomach.She can't sleep due to the pain. She has taken a laxative to help bowels move and reports moving small amount. She denies a blockage and says she has seen the GI doctor the last time this happened. She states she needs something for the abdominal pain and if she should stop the topamax or decrease the dose. She take 200 mg nightly. Advised I would send to provider and reach back out with advice/recommendations. She was appreciative of call.

## 2023-03-17 NOTE — Patient Instructions (Signed)
Cut back Topamax to 1 tablet at bedtime for 3 nights, restart dosing once feeling better 200 mg. Check labs. Follow up with primary care doctor.

## 2023-03-17 NOTE — Telephone Encounter (Signed)
Called and spoke to pt and was able to get her in at 345 today. Pt voiced gratitude and understanding

## 2023-03-18 ENCOUNTER — Encounter (HOSPITAL_COMMUNITY): Payer: Self-pay | Admitting: General Surgery

## 2023-03-18 ENCOUNTER — Emergency Department (HOSPITAL_COMMUNITY): Payer: Medicaid Other

## 2023-03-18 ENCOUNTER — Inpatient Hospital Stay (HOSPITAL_COMMUNITY)
Admission: EM | Admit: 2023-03-18 | Discharge: 2023-03-21 | DRG: 418 | Disposition: A | Discharge status: Home / Self Care | Payer: Medicaid Other | Attending: Family Medicine | Admitting: Family Medicine

## 2023-03-18 ENCOUNTER — Inpatient Hospital Stay (HOSPITAL_COMMUNITY): Payer: Medicaid Other

## 2023-03-18 ENCOUNTER — Telehealth: Payer: Self-pay | Admitting: Neurology

## 2023-03-18 ENCOUNTER — Other Ambulatory Visit: Payer: Self-pay

## 2023-03-18 DIAGNOSIS — G932 Benign intracranial hypertension: Secondary | ICD-10-CM | POA: Diagnosis present

## 2023-03-18 DIAGNOSIS — K760 Fatty (change of) liver, not elsewhere classified: Secondary | ICD-10-CM | POA: Diagnosis not present

## 2023-03-18 DIAGNOSIS — I10 Essential (primary) hypertension: Secondary | ICD-10-CM | POA: Diagnosis not present

## 2023-03-18 DIAGNOSIS — R1010 Upper abdominal pain, unspecified: Secondary | ICD-10-CM | POA: Diagnosis not present

## 2023-03-18 DIAGNOSIS — K7200 Acute and subacute hepatic failure without coma: Secondary | ICD-10-CM | POA: Diagnosis not present

## 2023-03-18 DIAGNOSIS — K8044 Calculus of bile duct with chronic cholecystitis without obstruction: Principal | ICD-10-CM | POA: Diagnosis present

## 2023-03-18 DIAGNOSIS — K8020 Calculus of gallbladder without cholecystitis without obstruction: Secondary | ICD-10-CM | POA: Diagnosis not present

## 2023-03-18 DIAGNOSIS — Z8249 Family history of ischemic heart disease and other diseases of the circulatory system: Secondary | ICD-10-CM | POA: Diagnosis not present

## 2023-03-18 DIAGNOSIS — N179 Acute kidney failure, unspecified: Secondary | ICD-10-CM

## 2023-03-18 DIAGNOSIS — Z79899 Other long term (current) drug therapy: Secondary | ICD-10-CM

## 2023-03-18 DIAGNOSIS — Z79890 Hormone replacement therapy (postmenopausal): Secondary | ICD-10-CM

## 2023-03-18 DIAGNOSIS — R7401 Elevation of levels of liver transaminase levels: Principal | ICD-10-CM

## 2023-03-18 DIAGNOSIS — K862 Cyst of pancreas: Secondary | ICD-10-CM | POA: Diagnosis not present

## 2023-03-18 DIAGNOSIS — Z880 Allergy status to penicillin: Secondary | ICD-10-CM

## 2023-03-18 DIAGNOSIS — K5730 Diverticulosis of large intestine without perforation or abscess without bleeding: Secondary | ICD-10-CM | POA: Diagnosis not present

## 2023-03-18 DIAGNOSIS — K8065 Calculus of gallbladder and bile duct with chronic cholecystitis with obstruction: Principal | ICD-10-CM | POA: Diagnosis present

## 2023-03-18 DIAGNOSIS — E038 Other specified hypothyroidism: Secondary | ICD-10-CM | POA: Diagnosis present

## 2023-03-18 DIAGNOSIS — G43909 Migraine, unspecified, not intractable, without status migrainosus: Secondary | ICD-10-CM | POA: Diagnosis not present

## 2023-03-18 DIAGNOSIS — K8050 Calculus of bile duct without cholangitis or cholecystitis without obstruction: Secondary | ICD-10-CM | POA: Diagnosis not present

## 2023-03-18 DIAGNOSIS — Z23 Encounter for immunization: Secondary | ICD-10-CM | POA: Diagnosis not present

## 2023-03-18 DIAGNOSIS — N281 Cyst of kidney, acquired: Secondary | ICD-10-CM | POA: Diagnosis not present

## 2023-03-18 DIAGNOSIS — E876 Hypokalemia: Secondary | ICD-10-CM | POA: Diagnosis present

## 2023-03-18 DIAGNOSIS — Z888 Allergy status to other drugs, medicaments and biological substances status: Secondary | ICD-10-CM | POA: Diagnosis not present

## 2023-03-18 DIAGNOSIS — Z9851 Tubal ligation status: Secondary | ICD-10-CM | POA: Diagnosis not present

## 2023-03-18 DIAGNOSIS — R748 Abnormal levels of other serum enzymes: Secondary | ICD-10-CM | POA: Diagnosis not present

## 2023-03-18 DIAGNOSIS — R1011 Right upper quadrant pain: Secondary | ICD-10-CM

## 2023-03-18 DIAGNOSIS — R110 Nausea: Secondary | ICD-10-CM | POA: Diagnosis not present

## 2023-03-18 DIAGNOSIS — Z6841 Body Mass Index (BMI) 40.0 and over, adult: Secondary | ICD-10-CM | POA: Diagnosis not present

## 2023-03-18 DIAGNOSIS — R160 Hepatomegaly, not elsewhere classified: Secondary | ICD-10-CM | POA: Diagnosis not present

## 2023-03-18 DIAGNOSIS — R7989 Other specified abnormal findings of blood chemistry: Secondary | ICD-10-CM | POA: Diagnosis not present

## 2023-03-18 HISTORY — DX: Acute kidney failure, unspecified: N17.9

## 2023-03-18 LAB — CBC WITH DIFFERENTIAL/PLATELET
Basophils Absolute: 0 10*3/uL (ref 0.0–0.2)
Basos: 0 %
EOS (ABSOLUTE): 0 10*3/uL (ref 0.0–0.4)
Eos: 1 %
Hematocrit: 41.4 % (ref 34.0–46.6)
Hemoglobin: 13.8 g/dL (ref 11.1–15.9)
Immature Grans (Abs): 0 10*3/uL (ref 0.0–0.1)
Immature Granulocytes: 1 %
Lymphocytes Absolute: 1.9 10*3/uL (ref 0.7–3.1)
Lymphs: 25 %
MCH: 29.4 pg (ref 26.6–33.0)
MCHC: 33.3 g/dL (ref 31.5–35.7)
MCV: 88 fL (ref 79–97)
Monocytes Absolute: 0.6 10*3/uL (ref 0.1–0.9)
Monocytes: 8 %
Neutrophils Absolute: 5.2 10*3/uL (ref 1.4–7.0)
Neutrophils: 65 %
Platelets: 298 10*3/uL (ref 150–450)
RBC: 4.69 x10E6/uL (ref 3.77–5.28)
RDW: 13.6 % (ref 11.7–15.4)
WBC: 7.8 10*3/uL (ref 3.4–10.8)

## 2023-03-18 LAB — CBC
HCT: 42.9 % (ref 36.0–46.0)
Hemoglobin: 13.4 g/dL (ref 12.0–15.0)
MCH: 28.7 pg (ref 26.0–34.0)
MCHC: 31.2 g/dL (ref 30.0–36.0)
MCV: 91.9 fL (ref 80.0–100.0)
Platelets: 286 10*3/uL (ref 150–400)
RBC: 4.67 MIL/uL (ref 3.87–5.11)
RDW: 14.5 % (ref 11.5–15.5)
WBC: 5.9 10*3/uL (ref 4.0–10.5)
nRBC: 0 % (ref 0.0–0.2)

## 2023-03-18 LAB — HEPATITIS A ANTIBODY, TOTAL: hep A Total Ab: NONREACTIVE

## 2023-03-18 LAB — URINALYSIS, ROUTINE W REFLEX MICROSCOPIC
Glucose, UA: NEGATIVE mg/dL
Hgb urine dipstick: NEGATIVE
Ketones, ur: NEGATIVE mg/dL
Leukocytes,Ua: NEGATIVE
Nitrite: NEGATIVE
Protein, ur: NEGATIVE mg/dL
Specific Gravity, Urine: 1.019 (ref 1.005–1.030)
pH: 5 (ref 5.0–8.0)

## 2023-03-18 LAB — HEPATITIS PANEL, ACUTE
HCV Ab: NONREACTIVE
Hep A IgM: NONREACTIVE
Hep B C IgM: NONREACTIVE
Hepatitis B Surface Ag: NONREACTIVE

## 2023-03-18 LAB — COMPREHENSIVE METABOLIC PANEL
ALT: 793 [IU]/L (ref 0–32)
ALT: 741 U/L — ABNORMAL HIGH (ref 0–44)
AST: 447 [IU]/L — ABNORMAL HIGH (ref 0–40)
AST: 302 U/L — ABNORMAL HIGH (ref 15–41)
Albumin: 4.6 g/dL (ref 3.9–4.9)
Albumin: 3.5 g/dL (ref 3.5–5.0)
Alkaline Phosphatase: 188 [IU]/L — ABNORMAL HIGH (ref 44–121)
Alkaline Phosphatase: 152 U/L — ABNORMAL HIGH (ref 38–126)
Anion gap: 7 (ref 5–15)
BUN/Creatinine Ratio: 9 (ref 9–23)
BUN: 10 mg/dL (ref 6–24)
BUN: 9 mg/dL (ref 6–20)
Bilirubin Total: 3.1 mg/dL — ABNORMAL HIGH (ref 0.0–1.2)
CO2: 16 mmol/L — ABNORMAL LOW (ref 20–29)
CO2: 18 mmol/L — ABNORMAL LOW (ref 22–32)
Calcium: 9.7 mg/dL (ref 8.7–10.2)
Calcium: 8.8 mg/dL — ABNORMAL LOW (ref 8.9–10.3)
Chloride: 107 mmol/L — ABNORMAL HIGH (ref 96–106)
Chloride: 112 mmol/L — ABNORMAL HIGH (ref 98–111)
Creatinine, Ser: 1.08 mg/dL — ABNORMAL HIGH (ref 0.57–1.00)
Creatinine, Ser: 1.05 mg/dL — ABNORMAL HIGH (ref 0.44–1.00)
GFR, Estimated: >60 mL/min (ref >60)
Globulin, Total: 3.7 g/dL (ref 1.5–4.5)
Glucose, Bld: 97 mg/dL (ref 70–99)
Glucose: 93 mg/dL (ref 70–99)
Potassium: 3.4 mmol/L — ABNORMAL LOW (ref 3.5–5.2)
Potassium: 3.3 mmol/L — ABNORMAL LOW (ref 3.5–5.1)
Sodium: 139 mmol/L (ref 134–144)
Sodium: 137 mmol/L (ref 135–145)
Total Bilirubin: 4.4 mg/dL — ABNORMAL HIGH (ref <1.2)
Total Protein: 8.3 g/dL (ref 6.0–8.5)
Total Protein: 7.9 g/dL (ref 6.5–8.1)
eGFR: 66 mL/min/{1.73_m2} (ref >59)

## 2023-03-18 LAB — ACETAMINOPHEN LEVEL: Acetaminophen (Tylenol), Serum: <10 ug/mL — ABNORMAL LOW (ref 10–30)

## 2023-03-18 LAB — CK: Total CK: 180 U/L (ref 38–234)

## 2023-03-18 LAB — IRON: Iron: 66 ug/dL (ref 28–170)

## 2023-03-18 LAB — HIV ANTIBODY (ROUTINE TESTING W REFLEX): HIV Screen 4th Generation wRfx: NONREACTIVE

## 2023-03-18 LAB — FERRITIN: Ferritin: 105 ng/mL (ref 11–307)

## 2023-03-18 LAB — PROTIME-INR
INR: 1.2 (ref 0.8–1.2)
Prothrombin Time: 15.9 s — ABNORMAL HIGH (ref 11.4–15.2)

## 2023-03-18 LAB — LIPASE, BLOOD: Lipase: 132 U/L — ABNORMAL HIGH (ref 11–51)

## 2023-03-18 LAB — HEPATITIS B CORE ANTIBODY, TOTAL: Hep B Core Total Ab: NONREACTIVE

## 2023-03-18 LAB — HCG, SERUM, QUALITATIVE: Preg, Serum: NEGATIVE

## 2023-03-18 LAB — HEPATITIS B SURFACE ANTIBODY,QUALITATIVE: Hep B S Ab: NONREACTIVE

## 2023-03-18 MED ORDER — AMLODIPINE BESYLATE 10 MG PO TABS
10.0000 mg | ORAL_TABLET | Freq: Every day | ORAL | Status: DC
  Administered 2023-03-18 – 2023-03-21 (×3): 10 mg via ORAL
  Filled 2023-03-18 (×3): qty 1

## 2023-03-18 MED ORDER — LACTATED RINGERS IV BOLUS
500.0000 mL | Freq: Once | INTRAVENOUS | Status: AC
  Administered 2023-03-18: 500 mL via INTRAVENOUS

## 2023-03-18 MED ORDER — SODIUM CHLORIDE 0.9 % IV BOLUS: 500.0000 mL | Freq: Once | INTRAVENOUS | Status: DC

## 2023-03-18 MED ORDER — LORAZEPAM 2 MG/ML IJ SOLN
1.0000 mg | Freq: Once | INTRAMUSCULAR | Status: AC | PRN
  Administered 2023-03-18: 1 mg via INTRAVENOUS
  Filled 2023-03-18: qty 1

## 2023-03-18 MED ORDER — HYDROCHLOROTHIAZIDE 25 MG PO TABS
25.0000 mg | ORAL_TABLET | Freq: Every day | ORAL | Status: DC
  Administered 2023-03-18: 25 mg via ORAL
  Filled 2023-03-18: qty 1

## 2023-03-18 MED ORDER — ENOXAPARIN SODIUM 40 MG/0.4ML IJ SOSY
40.0000 mg | PREFILLED_SYRINGE | INTRAMUSCULAR | Status: DC
  Administered 2023-03-18: 40 mg via SUBCUTANEOUS
  Filled 2023-03-18 (×2): qty 0.4

## 2023-03-18 MED ORDER — LEVOTHYROXINE SODIUM 50 MCG PO TABS
50.0000 ug | ORAL_TABLET | Freq: Every day | ORAL | Status: AC
Start: 2023-03-18 — End: ?
  Administered 2023-03-18 – 2023-03-21 (×4): 50 ug via ORAL
  Filled 2023-03-18 (×4): qty 1

## 2023-03-18 MED ORDER — GADOBUTROL 1 MMOL/ML IV SOLN
10.0000 mL | Freq: Once | INTRAVENOUS | Status: AC | PRN
  Administered 2023-03-18: 10 mL via INTRAVENOUS

## 2023-03-18 NOTE — Progress Notes (Signed)
NEW ADMISSION NOTE New Admission Note:   Arrival Method: Patient arrived from ED Mental Orientation: Alert and oriented x 4. Telemetry: N/A Assessment: Completed Skin: warm, dry and intact. IV: L FA SL Pain: Denies any pain. Tubes: N/A Safety Measures: Safety Fall Prevention Plan has been given, discussed and implemented. Admission: Completed [facility] Orientation: Patient has been orientated to the room, unit and staff.  Family: None at bedsid3  Orders have been reviewed and implemented. Will continue to monitor the patient. Call light has been placed within reach and bed alarm has been activated.   Arvilla Meres, RN

## 2023-03-18 NOTE — H&P (Addendum)
Hospital Admission History and Physical Service Pager: [phone]  Patient name: Anna Anderson Medical record number: [id number] Date of Birth: November 28, 1980 Age: 42 y.o. Gender: female  Primary Care Provider: Latrelle Dodrill, MD Consultants: GI, General Surgery Code Status: FULL which was confirmed with family if patient unable to confirm   Preferred Emergency Contact: Extended Emergency Contact Information Primary Emergency Contact: Westly Pam [place] Home Phone: [phone] Relation: Mother Secondary Emergency Contact: Burkes,Lisa Mobile Phone: [phone] Relation: Sister   Chief Complaint: Abdominal pain, nausea, vomiting and transaminitis  Assessment and Plan: Anna Anderson is a 41 y.o. female presenting with acute transaminitis, unclear etiology at this time . Differential for presentation of this includes fatty liver disease, possible cholelithiasis (needs further workup with MRCP), hepatitis (pending panel), pancreatitis, drug or toxin induced liver injury, malignancy.  Reassuringly hemodynamically stable with no signs of fulminant liver failure (skin without jaundice, hepatic encephalopathy. However, she does have symptoms consistent with acute liver failure - RUQ pain, nausea, loss of appetite.  Assessment & Plan Transaminitis General surgery and GI consulted in the ED. RUQ nonspecific for intraabdominal cause. MRCP has been ordered for further eval of possible cholelithiasis. Patient does fatty liver infiltrate, cannot rule out acute hepatitis at this time. Lipase is also elevated, but given rise in Alk phos, AST and ALT, less concerning for pancreatitis at this time.  -Admit to FMTS, Dr. Manson Passey attending -Med-surg -Vitals per floor -MRCP per GI and Surgery  -Surgery following, but no interventions at this time -hepatitis panel negative -AM CMP, CBC RUQ abdominal pain Shadowing seen on RUQ [place], could be stones in the GB, plan as  above with the following additions: - PRN zofran for nausea/vomiting - consider pain management if needed/desired.  AKI (acute kidney injury) (HCC) Mild creatinine elevation to 1.05, was 1.08 earlier this week. Previously normal at 0.75 in March. Likely elevated in the setting of poor PO intake. -500 mL bolus of LR while NPO -encourage PO intake following procedures.   Chronic and Stable Problems:  HTN: Continue home amlodipine 10 mg, HCTZ 25 mg Subclinical hypothyroidism: Continue home levothyroxine 50 mcg Pseudotumor cerebri: Holding home Topamax due to elevated transaminases   FEN/GI: NPO for now VTE Prophylaxis: Lovenox  Disposition: Med-surg  History of Present Illness:  Anna Anderson is a 42 y.o. female presenting with abdominal pain, nausea, vomiting for the past 2 days.  She reports 1 previous episode similar to this 1 2 months ago where she got bandlike pain across the bottom of her diaphragm and epigastric region that felt like someone was squeezing her she felt very ill she vomited and she could not poop.  She said that the episode resolved after she drank electrolyte water and was able to have a bowel movement.  This time she says the pain is worse but in the same pattern she has not had any issues with bowel movements and it came on after she ate [employer] yesterday.  She is felt nauseous but had no vomiting this time.  She denies any alcohol or excess Tylenol use, no new changes in medications no recent travel outside of the country, no sick contacts and no new pets.  She has had 1 new sexual partner in the last 6 months but they have both been tested for STDs and practice safe sex.  In the ED, she was made NPO and RUQ ultrasound obtained. General surgery and GI consulted who recommended MRCP and admission for  transaminitis work up.   Review Of Systems: Per HPI   Pertinent Past Medical History: Vitamin D deficiency Pseudotumor cerebri Remainder reviewed in history  tab.   Pertinent Past Surgical History: Past Surgical History:  Procedure Laterality Date   CESAREAN SECTION     x3   TUBAL LIGATION       Remainder reviewed in history tab.  Pertinent Social History: Tobacco use: No Alcohol use: None Other Substance use: None Lives with self  Pertinent Family History: Mother with diabetes and hypertension  Remainder reviewed in history tab.   Important Outpatient Medications: Amlodipine  Topamax HCTZ Synthroid  Remainder reviewed in medication history.   Objective: BP (!) 151/91   Pulse (!) 56   Temp 97.9 F (36.6 C) (Oral)   Resp 17   LMP 02/15/2023 (Approximate)   SpO2 100%  Exam: General: Alert, well-appearing female no apparent distress Eyes: PERRLA EOMI, anicteric sclera Cardiovascular: RRR, no M/R/G Respiratory: CTAB, no increased work of breathing Gastrointestinal: Flat, soft, minimal tenderness to deep palpation in the left lower quadrant, sore across epigastric region but no sharp or focal pain at this time. MSK: Moves all 4 limbs symmetrically and appropriately   Labs:  CBC BMET  Recent Labs  Lab 03/18/23 0737  WBC 5.9  HGB 13.4  HCT 42.9  PLT 286   Recent Labs  Lab 03/18/23 0737  NA 137  K 3.3*  CL 112*  CO2 18*  BUN 9  CREATININE 1.05*  GLUCOSE 97  CALCIUM 8.8*    Lipase-132 AST-302 ALT-741  T. bili-4.4  EKG: No EKG   Imaging Studies Performed:  US abdomen limited: Fatty liver, shadowing echogenic area near the gallbladder could be stones MRCP recommended  Gerrit Heck, DO 03/18/2023, 3:37 PM PGY-1, [facility] Family Medicine  FPTS Intern pager: [phone], text pages welcome Secure chat group [facility] Teaching Service

## 2023-03-18 NOTE — Assessment & Plan Note (Addendum)
General surgery and GI consulted in the ED. RUQ nonspecific for intraabdominal cause. MRCP has been ordered for further eval of possible cholelithiasis. Patient does fatty liver infiltrate, cannot rule out acute hepatitis at this time. Lipase is also elevated, but given rise in Alk phos, AST and ALT, less concerning for pancreatitis at this time.  -Admit to FMTS, Dr. Manson Passey attending -Med-surg -Vitals per floor -MRCP per GI and Surgery  -Surgery following, but no interventions at this time -hepatitis panel negative -AM CMP, CBC

## 2023-03-18 NOTE — Assessment & Plan Note (Addendum)
Mild creatinine elevation to 1.05, was 1.08 earlier this week. Previously normal at 0.75 in March. Likely elevated in the setting of poor PO intake. -500 mL bolus of LR while NPO -encourage PO intake following procedures.

## 2023-03-18 NOTE — Plan of Care (Signed)
FMTS Brief Progress Note  S: To bedside for night rounds.  Patient denies concerns at this time, understands plan of care.  States her abdominal pain has largely improved.   O: BP (!) 153/99 (BP Location: Right Arm)   Pulse (!) 58   Temp (!) 97.4 F (36.3 C) (Oral)   Resp 20   Ht 5\' 1"  (1.549 m)   Wt 100.6 kg   LMP 02/15/2023 (Approximate)   SpO2 96%   BMI 41.89 kg/m    General: NAD, pleasant, able to participate in exam Respiratory: No respiratory distress Skin: warm and dry, no rashes noted Psych: Normal affect and mood  EKG showed NSR with no acute ST changes  A/P:  RUQ pain, acute transaminitis Stable -Appreciate GI and surgery involvement, follow-up results of serologic workup as noted -Clear liquid diet  Remainder per H&P  - Orders reviewed. Labs for AM ordered, which was adjusted as needed.  - If condition changes, plan includes page primary team.   Vonna Drafts, MD 03/18/2023, 10:10 PM PGY-2, [facility] Family [facility] Night Resident  Please page [phone] with questions.

## 2023-03-18 NOTE — Hospital Course (Addendum)
Anna Anderson is a 42 y.o. female who was admitted to the [facility] Medicine Teaching Service at [facility] for transaminitis and epigastric/RUQ pain. Hospital course is outlined below by problem.   Transaminitis  RUQ pain Significantly elevated AST/ALT and right upper quadrant pain and found to have possible gallstones on RUQ [place].  GI was consulted and recommended MRCP that confirmed cholelithiasis without ductal dilation.  Autoimmune hepatitis workup negative.  General surgery recommended ***  AKI Likely prerenal secondary to decreased PO intake.  Given fluid resuscitation with improvement. ***  Other conditions that were chronic and stable managed with home medications with substitutions where appropriate.  Issues for follow up: Recommend EUS evaluation outpatient given incidental sidebranch IPMN

## 2023-03-18 NOTE — Consult Note (Signed)
Anna Anderson March 31, 1980  [id number].    Requesting MD: Dr. Elayne Snare Chief Complaint/Reason for Consult: Elevated LFT's  HPI: Anna Anderson is a 42 y.o. female who presented to the ED for abnormal labs.  Patient was recently seen by neurology on 12/18 for follow-up of pseudotumor cerebri and chronic migraine headaches on Topamax.  She called their office and was seen because she thought the Topamax was causing her abdominal pain, nausea and vomiting.  She reports on 12/17 after she ate fried food, mac & cheese she began having upper epigastric/ruq abdominal pain, nausea, vomiting and developed an associated fever.  This recurred last night after she had dinner. She tried taking a laxative but this did not help her pain. She notes similar episode 2 months ago that resolved without intervention. Outpatient labs were done that showed elevated LFTs prompting recommendation to visit the ED for evaluation.  In the ED patient has been afebrile without tachycardia or hypotension.  WBC 5.9.  Lipase 132.  Alk phos 152, AST 302, ALT 741, T. bili 4.4.  RUQ [place] with poor visualization of the gallbladder, CBD 3 mm.  Patient admitted to family medicine.  GI and general surgery consulted.  MRCP pending.  To note, patient reports she has had a 25 pound weight loss since August.  Past Medical History: Hypertension, pseudotumor cerebri, migraine headaches Prior Abdominal Surgeries: Tubal ligation, C-section x3 Blood Thinners: None Allergies: Amoxicillin, lisinopril Tobacco Use: None Alcohol Use: None Substance use: None Employment: [employer]   ROS: ROS As above  Family History  Problem Relation Age of Onset   Diabetes Mother    Heart disease Mother    Healthy Father     Past Medical History:  Diagnosis Date   Abdominal pain 01/01/2020   Concussion    Constipation 10/25/2017   Hypertension    Left lower quadrant abdominal pain 10/20/2018   Post concussive syndrome 05/18/2016    Right facial swelling 08/12/2017   Vitamin D deficiency 07/24/2014    Past Surgical History:  Procedure Laterality Date   CESAREAN SECTION     x3   TUBAL LIGATION      Social History:  reports that she has never smoked. She has never used smokeless tobacco. She reports that she does not drink alcohol and does not use drugs.  Allergies:  Allergies  Allergen Reactions   Amoxicillin Swelling   Lisinopril Cough    (Not in a hospital admission)    Physical Exam: Blood pressure (!) 151/91, pulse (!) 56, temperature 97.9 F (36.6 C), temperature source Oral, resp. rate 17, last menstrual period 02/15/2023, SpO2 100%.  General: pleasant, WD/WN female who is laying in bed in NAD HEENT: head is normocephalic, atraumatic.  Sclera are non-icteric. Ears and nose without any obvious masses or lesions.  Mouth is pink and moist. Dentition fair Heart: regular, rate, and rhythm.   Lungs: CTAB, no wheezes, rhonchi, or rales noted.  Respiratory effort nonlabored Abd: Soft, ND, +BS. No masses, hernias, or organomegaly. Mild upper abdominal TTP in the epigastric and RUQ MS: no BUE or BLE edema Skin: warm and dry  Psych: A&Ox4 with an appropriate affect Neuro: normal speech, thought process intact, moves all extremities, gait not assessed   Results for orders placed or performed during the hospital encounter of 03/18/23 (from the past 48 hours)  Lipase, blood     Status: Abnormal   Collection Time: 03/18/23  7:37 AM  Result Value Ref Range  Lipase 132 (H) 11 - 51 U/L    Comment: Performed at [facility], [address] [place], [place] [address]  Comprehensive metabolic panel     Status: Abnormal   Collection Time: 03/18/23  7:37 AM  Result Value Ref Range   Sodium 137 135 - 145 mmol/L   Potassium 3.3 (L) 3.5 - 5.1 mmol/L   Chloride 112 (H) 98 - 111 mmol/L   CO2 18 (L) 22 - 32 mmol/L   Glucose, Bld 97 70 - 99 mg/dL    Comment: Glucose reference range applies only to samples  taken after fasting for at least 8 hours.   BUN 9 6 - 20 mg/dL   Creatinine, Ser 6.04 (H) 0.44 - 1.00 mg/dL   Calcium 8.8 (L) 8.9 - 10.3 mg/dL   Total Protein 7.9 6.5 - 8.1 g/dL   Albumin 3.5 3.5 - 5.0 g/dL   AST 540 (H) 15 - 41 U/L   ALT 741 (H) 0 - 44 U/L   Alkaline Phosphatase 152 (H) 38 - 126 U/L   Total Bilirubin 4.4 (H) <1.2 mg/dL   GFR, Estimated >98 >11 mL/min    Comment: (NOTE) Calculated using the CKD-EPI Creatinine Equation (2021)    Anion gap 7 5 - 15    Comment: Performed at [facility], [address] [place], [place] [address]  CBC     Status: None   Collection Time: 03/18/23  7:37 AM  Result Value Ref Range   WBC 5.9 4.0 - 10.5 K/uL   RBC 4.67 3.87 - 5.11 MIL/uL   Hemoglobin 13.4 12.0 - 15.0 g/dL   HCT 29.5 62.1 - 30.8 %   MCV 91.9 80.0 - 100.0 fL   MCH 28.7 26.0 - 34.0 pg   MCHC 31.2 30.0 - 36.0 g/dL   RDW 65.7 84.6 - 96.2 %   Platelets 286 150 - 400 K/uL   nRBC 0.0 0.0 - 0.2 %    Comment: Performed at [facility], [address] [place], [place] [address]  hCG, serum, qualitative     Status: None   Collection Time: 03/18/23  7:37 AM  Result Value Ref Range   Preg, Serum NEGATIVE NEGATIVE    Comment:        THE SENSITIVITY OF THIS METHODOLOGY IS >10 mIU/mL. Performed at [facility], [address] [place], [place] [address]   Urinalysis, Routine w reflex microscopic -Urine, Clean Catch     Status: Abnormal   Collection Time: 03/18/23  7:52 AM  Result Value Ref Range   Color, Urine AMBER (A) YELLOW    Comment: BIOCHEMICALS MAY BE AFFECTED BY COLOR   APPearance HAZY (A) CLEAR   Specific Gravity, Urine 1.019 1.005 - 1.030   pH 5.0 5.0 - 8.0   Glucose, UA NEGATIVE NEGATIVE mg/dL   Hgb urine dipstick NEGATIVE NEGATIVE   Bilirubin Urine MODERATE (A) NEGATIVE   Ketones, ur NEGATIVE NEGATIVE mg/dL   Protein, ur NEGATIVE NEGATIVE mg/dL   Nitrite NEGATIVE NEGATIVE   Leukocytes,Ua NEGATIVE NEGATIVE    Comment: Performed at [facility], [address], [place], [place] [address]   US Abdomen Limited RUQ (LIVER/GB) Result Date: 03/18/2023 CLINICAL DATA:  Elevated liver function tests EXAM: ULTRASOUND ABDOMEN LIMITED RIGHT UPPER QUADRANT COMPARISON:  None Available. FINDINGS: Gallbladder: Gallbladder itself is poorly seen. There is a echogenic shadowing area in the expected location of the gallbladder. This could be a gallbladder filled  with stones but poorly defined. Common bile duct: Diameter: 3 mm Liver: Echogenic hepatic parenchyma consistent with fatty liver infiltration. With this level of echogenicity evaluation for underlying mass lesion is limited and if needed follow-up contrast CT or MRI as clinically directed. Portal vein is patent on color Doppler imaging with normal direction of blood flow towards the liver. Other: Evaluation limited by overlapping bowel gas and soft tissue as per the sonographer. IMPRESSION: Fatty liver infiltration. In the location of the gallbladder is a shadowing echogenic area. This could be a gallbladder filled with stones but difficult to confirm on this examination. Recommend further workup such as MRCP. No biliary ductal dilatation. Electronically Signed   By: Karen Kays M.D.   On: 03/18/2023 11:13    Anti-infectives (From admission, onward)    None       Assessment/Plan Elevated LFTs Upper abdominal pain This is a 42 year old female who presented with upper abdominal pain, nausea and vomiting with outpatient labs demonstrating elevated LFTs.  Her RUQ [place] with poor visualization of the gallbladder.  Lipase and LFTs elevated as above.  Agree with plan for GI consult and MRCP to further evaluate.  No plans for surgery at this time but we will follow along closely as further workup undergoes.  FEN - NPO VTE - SCDs, okay for chem ppx from our standpoint ID - None Foley - None Dispo - Admit to medicine.   I reviewed ED provider notes, last 24 h vitals and pain scores, last 24 h  labs and trends, and last 24 h imaging results.   Carlena Bjornstad, PA-C [facility] 03/18/2023, 12:43 PM Please see Amion for pager number during day hours 7:00am-4:30pm

## 2023-03-18 NOTE — ED Triage Notes (Signed)
Pt. Stated, My liver labs are abnormal and Ive got orange urine and my stomach is sore . My Dr. Catalina Pizza me to come here.

## 2023-03-18 NOTE — Progress Notes (Signed)
Chart reviewed, agree above plan ?

## 2023-03-18 NOTE — Telephone Encounter (Signed)
I called the patient, advised her to go to the ER for further evaluation. Significant elevation of liver enzymes AST 447, ALT 793. CBC was normal.

## 2023-03-18 NOTE — Assessment & Plan Note (Signed)
Shadowing seen on RUQ [place], could be stones in the GB, plan as above with the following additions: - PRN zofran for nausea/vomiting - consider pain management if needed/desired.

## 2023-03-18 NOTE — ED Notes (Signed)
ED TO INPATIENT HANDOFF REPORT  ED Nurse Name and Phone #: Melburn Popper Name/Age/Gender Anna Anderson 41 y.o. female Room/Bed: H015C/H015C  Code Status   Code Status: Full Code  Home/SNF/Other Home Patient oriented to: self, place, time, and situation Is this baseline? Yes   Triage Complete: Triage complete  Chief Complaint Transaminitis [R74.01]  Triage Note Pt. Stated, My liver labs are abnormal and Ive got orange urine and my stomach is sore . My Dr. Catalina Pizza me to come here.   Allergies Allergies  Allergen Reactions   Amoxicillin Swelling   Lisinopril Cough    Level of Care/Admitting Diagnosis ED Disposition     ED Disposition  Admit   Condition  --   Comment  Hospital [facility]: [facility] [[id number]]  Level of Care: Med-Surg [16]  May admit patient to [facility] or [facility] if equivalent level of care is available:: No  Covid Evaluation: Asymptomatic - no recent exposure (last 10 days) testing not required  Diagnosis: Transaminitis [865784]  Admitting Physician: Gerrit Heck [[id number]]  Attending Physician: Westley Chandler [[id number]]  Certification:: I certify this patient will need inpatient services for at least 2 midnights  Expected Medical Readiness: 03/20/2023          B Medical/Surgery History Past Medical History:  Diagnosis Date   Abdominal pain 01/01/2020   Concussion    Constipation 10/25/2017   Hypertension    Left lower quadrant abdominal pain 10/20/2018   Post concussive syndrome 05/18/2016   Right facial swelling 08/12/2017   Vitamin D deficiency 07/24/2014   Past Surgical History:  Procedure Laterality Date   CESAREAN SECTION     x3   TUBAL LIGATION       A IV Location/Drains/Wounds Patient Lines/Drains/Airways Status     Active Line/Drains/Airways     Name Placement date Placement time Site Days   Peripheral IV 03/18/23 20 G 1.88" Anterior;Left Forearm 03/18/23  1338  Forearm  less than 1             Intake/Output Last 24 hours No intake or output data in the 24 hours ending 03/18/23 1421  Labs/Imaging Results for orders placed or performed during the hospital encounter of 03/18/23 (from the past 48 hours)  Lipase, blood     Status: Abnormal   Collection Time: 03/18/23  7:37 AM  Result Value Ref Range   Lipase 132 (H) 11 - 51 U/L    Comment: Performed at [facility], [address], [place], [place] [address]  Comprehensive metabolic panel     Status: Abnormal   Collection Time: 03/18/23  7:37 AM  Result Value Ref Range   Sodium 137 135 - 145 mmol/L   Potassium 3.3 (L) 3.5 - 5.1 mmol/L   Chloride 112 (H) 98 - 111 mmol/L   CO2 18 (L) 22 - 32 mmol/L   Glucose, Bld 97 70 - 99 mg/dL    Comment: Glucose reference range applies only to samples taken after fasting for at least 8 hours.   BUN 9 6 - 20 mg/dL   Creatinine, Ser 2.53 (H) 0.44 - 1.00 mg/dL   Calcium 8.8 (L) 8.9 - 10.3 mg/dL   Total Protein 7.9 6.5 - 8.1 g/dL   Albumin 3.5 3.5 - 5.0 g/dL   AST 664 (H) 15 - 41 U/L   ALT 741 (H) 0 - 44 U/L   Alkaline Phosphatase 152 (H) 38 - 126 U/L   Total Bilirubin 4.4 (H) <  1.2 mg/dL   GFR, Estimated >13 >08 mL/min    Comment: (NOTE) Calculated using the CKD-EPI Creatinine Equation (2021)    Anion gap 7 5 - 15    Comment: Performed at [facility], [address] [place], [place] [address]  CBC     Status: None   Collection Time: 03/18/23  7:37 AM  Result Value Ref Range   WBC 5.9 4.0 - 10.5 K/uL   RBC 4.67 3.87 - 5.11 MIL/uL   Hemoglobin 13.4 12.0 - 15.0 g/dL   HCT 69.6 29.5 - 28.4 %   MCV 91.9 80.0 - 100.0 fL   MCH 28.7 26.0 - 34.0 pg   MCHC 31.2 30.0 - 36.0 g/dL   RDW 13.2 44.0 - 10.2 %   Platelets 286 150 - 400 K/uL   nRBC 0.0 0.0 - 0.2 %    Comment: Performed at [facility], [address], [place], [place] [address]  hCG, serum, qualitative     Status: None   Collection Time: 03/18/23  7:37 AM  Result Value Ref Range   Preg, Serum NEGATIVE  NEGATIVE    Comment:        THE SENSITIVITY OF THIS METHODOLOGY IS >10 mIU/mL. Performed at [facility], [address] [place], [place] [address]   Urinalysis, Routine w reflex microscopic -Urine, Clean Catch     Status: Abnormal   Collection Time: 03/18/23  7:52 AM  Result Value Ref Range   Color, Urine AMBER (A) YELLOW    Comment: BIOCHEMICALS MAY BE AFFECTED BY COLOR   APPearance HAZY (A) CLEAR   Specific Gravity, Urine 1.019 1.005 - 1.030   pH 5.0 5.0 - 8.0   Glucose, UA NEGATIVE NEGATIVE mg/dL   Hgb urine dipstick NEGATIVE NEGATIVE   Bilirubin Urine MODERATE (A) NEGATIVE   Ketones, ur NEGATIVE NEGATIVE mg/dL   Protein, ur NEGATIVE NEGATIVE mg/dL   Nitrite NEGATIVE NEGATIVE   Leukocytes,Ua NEGATIVE NEGATIVE    Comment: Performed at [facility], [address], [place], [place] [address]  Acetaminophen level     Status: Abnormal   Collection Time: 03/18/23 11:34 AM  Result Value Ref Range   Acetaminophen (Tylenol), Serum <10 (L) 10 - 30 ug/mL    Comment: (NOTE) Therapeutic concentrations vary significantly. A range of 10-30 ug/mL  may be an effective concentration for many patients. However, some  are best treated at concentrations outside of this range. Acetaminophen concentrations >150 ug/mL at 4 hours after ingestion  and >50 ug/mL at 12 hours after ingestion are often associated with  toxic reactions.  Performed at [facility], [address], [place], [place] [address]   Hepatitis panel, acute     Status: None   Collection Time: 03/18/23 11:34 AM  Result Value Ref Range   Hepatitis B Surface Ag NON REACTIVE NON REACTIVE   HCV Ab NON REACTIVE NON REACTIVE    Comment: (NOTE) Nonreactive HCV antibody screen is consistent with no HCV infections,  unless recent infection is suspected or other evidence exists to indicate HCV infection.     Hep A IgM NON REACTIVE NON REACTIVE   Hep B C IgM NON REACTIVE NON REACTIVE    Comment: Performed at  [facility], [address], [place], [place] [address]   US Abdomen Limited RUQ (LIVER/GB) Result Date: 03/18/2023 CLINICAL DATA:  Elevated liver function tests EXAM: ULTRASOUND ABDOMEN LIMITED RIGHT UPPER QUADRANT COMPARISON:  None Available.  FINDINGS: Gallbladder: Gallbladder itself is poorly seen. There is a echogenic shadowing area in the expected location of the gallbladder. This could be a gallbladder filled with stones but poorly defined. Common bile duct: Diameter: 3 mm Liver: Echogenic hepatic parenchyma consistent with fatty liver infiltration. With this level of echogenicity evaluation for underlying mass lesion is limited and if needed follow-up contrast CT or MRI as clinically directed. Portal vein is patent on color Doppler imaging with normal direction of blood flow towards the liver. Other: Evaluation limited by overlapping bowel gas and soft tissue as per the sonographer. IMPRESSION: Fatty liver infiltration. In the location of the gallbladder is a shadowing echogenic area. This could be a gallbladder filled with stones but difficult to confirm on this examination. Recommend further workup such as MRCP. No biliary ductal dilatation. Electronically Signed   By: Karen Kays M.D.   On: 03/18/2023 11:13    Pending Labs Unresulted Labs (From admission, onward)     Start     Ordered   03/19/23 0500  Comprehensive metabolic panel  Tomorrow morning,   R        03/18/23 1411   03/19/23 0500  CBC  Tomorrow morning,   R        03/18/23 1411   03/18/23 1405  HIV Antibody (routine testing w rflx)  (HIV Antibody (Routine testing w reflex) panel)  Once,   R        03/18/23 1411   03/18/23 1302  Protime-INR  Once,   STAT        03/18/23 1301            Vitals/Pain Today's Vitals   03/18/23 0657 03/18/23 0714 03/18/23 1158  BP: (!) 136/102  (!) 151/91  Pulse: 74  (!) 56  Resp: 16  17  Temp: 98 F (36.7 C)  97.9 F (36.6 C)  TempSrc: Oral  Oral  SpO2: 100%  100%  PainSc:   3      Isolation Precautions No active isolations  Medications Medications  amLODipine (NORVASC) tablet 10 mg (has no administration in time range)  hydrochlorothiazide (HYDRODIURIL) tablet 25 mg (has no administration in time range)  levothyroxine (SYNTHROID) tablet 50 mcg (has no administration in time range)  enoxaparin (LOVENOX) injection 40 mg (has no administration in time range)  sodium chloride 0.9 % bolus 500 mL (has no administration in time range)  LORazepam (ATIVAN) injection 1 mg (1 mg Intravenous Given 03/18/23 1416)    Mobility walks     Focused Assessments     R Recommendations: See Admitting Provider Note  Report given to:   Additional Notes:

## 2023-03-18 NOTE — ED Provider Notes (Signed)
[place] EMERGENCY DEPARTMENT AT [facility] Provider Note   CSN: [id number] Arrival date & time: 03/18/23  5284     History  Chief Complaint  Patient presents with   abnormal labs   Abdominal Pain   Nausea    Anna Anderson is a 42 y.o. female with a past medical history significant for hypertension, vitamin D deficiency, anxiety, pseudotumor cerebri who presents to the ED due to abnormal LFTs.  Patient had labs drawn with neurology yesterday and was told to report to the ED due to transaminitis.  Patient states yesterday after eating [employer] she developed sudden onset of right upper quadrant and epigastric pain.  She admits to a similar episode roughly 3 months ago.  Gallbladder intact.  Admits to nausea and emesis.  She ate a small amount of food last night and developed the upper abdominal pain again.  Patient is currently pain-free however, has not eaten since last night.  No chest pain or shortness of breath.  Patient also states her urine is "orange". Denies alcohol use. No chronic Tylenol use.   History obtained from patient and past medical records. No interpreter used during encounter.       Home Medications Prior to Admission medications   Medication Sig Start Date End Date Taking? Authorizing Provider  amLODipine (NORVASC) 10 MG tablet TAKE 1 TABLET(10 MG) BY MOUTH DAILY. Need MD appt before further refills. 02/05/23  Yes Pray, Milus Mallick, MD  hydrochlorothiazide (HYDRODIURIL) 25 MG tablet TAKE 1 TABLET(25 MG) BY MOUTH DAILY 10/07/22  Yes Pray, Milus Mallick, MD  levothyroxine (SYNTHROID) 50 MCG tablet TAKE 1 TABLET BY MOUTH EVERY DAY 03/03/23  Yes Latrelle Dodrill, MD  potassium chloride SA (KLOR-CON M) 20 MEQ tablet TAKE 2 TABLETS(40 MEQ) BY MOUTH DAILY Patient taking differently: Take 20 mEq by mouth daily as needed. 07/28/21  Yes Latrelle Dodrill, MD  topiramate (TOPAMAX) 100 MG tablet Take 1 tablet (100 mg total) by mouth 2 (two) times  daily. Patient taking differently: Take 200 mg by mouth at bedtime. 11/04/22  Yes Levert Feinstein, MD  triamcinolone ointment (KENALOG) 0.5 % APPLY TOPICALLY TO THE AFFECTED AREA TWICE DAILY FOR MODERATE TO SEVERE ECZEMA. DO NOT USE FOR MORE THAN 1 WEEK AT A TIME Patient taking differently: Apply 1 Application topically daily as needed (eczema). 07/03/22  Yes Latrelle Dodrill, MD      Allergies    Amoxicillin and Lisinopril    Review of Systems   Review of Systems  Respiratory:  Negative for shortness of breath.   Cardiovascular:  Negative for chest pain.  Gastrointestinal:  Positive for abdominal pain, nausea and vomiting. Negative for diarrhea.    Physical Exam Updated Vital Signs BP (!) 151/91   Pulse (!) 56   Temp 97.9 F (36.6 C) (Oral)   Resp 17   LMP 02/15/2023 (Approximate)   SpO2 100%  Physical Exam Vitals and nursing note reviewed.  Constitutional:      General: She is not in acute distress.    Appearance: She is not ill-appearing.  HENT:     Head: Normocephalic.  Eyes:     Pupils: Pupils are equal, round, and reactive to light.  Cardiovascular:     Rate and Rhythm: Normal rate and regular rhythm.     Pulses: Normal pulses.     Heart sounds: Normal heart sounds. No murmur heard.    No friction rub. No gallop.  Pulmonary:     Effort: Pulmonary effort  is normal.     Breath sounds: Normal breath sounds.  Abdominal:     General: Abdomen is flat. There is no distension.     Palpations: Abdomen is soft.     Tenderness: There is no abdominal tenderness. There is no guarding or rebound.  Musculoskeletal:        General: Normal range of motion.     Cervical back: Neck supple.  Skin:    General: Skin is warm and dry.  Neurological:     General: No focal deficit present.     Mental Status: She is alert.  Psychiatric:        Mood and Affect: Mood normal.        Behavior: Behavior normal.     ED Results / Procedures / Treatments   Labs (all labs ordered are  listed, but only abnormal results are displayed) Labs Reviewed  LIPASE, BLOOD - Abnormal; Notable for the following components:      Result Value   Lipase 132 (*)    All other components within normal limits  COMPREHENSIVE METABOLIC PANEL - Abnormal; Notable for the following components:   Potassium 3.3 (*)    Chloride 112 (*)    CO2 18 (*)    Creatinine, Ser 1.05 (*)    Calcium 8.8 (*)    AST 302 (*)    ALT 741 (*)    Alkaline Phosphatase 152 (*)    Total Bilirubin 4.4 (*)    All other components within normal limits  URINALYSIS, ROUTINE W REFLEX MICROSCOPIC - Abnormal; Notable for the following components:   Color, Urine AMBER (*)    APPearance HAZY (*)    Bilirubin Urine MODERATE (*)    All other components within normal limits  ACETAMINOPHEN LEVEL - Abnormal; Notable for the following components:   Acetaminophen (Tylenol), Serum <10 (*)    All other components within normal limits  CBC  HCG, SERUM, QUALITATIVE  HEPATITIS PANEL, ACUTE  PROTIME-INR    EKG None  Radiology US Abdomen Limited RUQ (LIVER/GB) Result Date: 03/18/2023 CLINICAL DATA:  Elevated liver function tests EXAM: ULTRASOUND ABDOMEN LIMITED RIGHT UPPER QUADRANT COMPARISON:  None Available. FINDINGS: Gallbladder: Gallbladder itself is poorly seen. There is a echogenic shadowing area in the expected location of the gallbladder. This could be a gallbladder filled with stones but poorly defined. Common bile duct: Diameter: 3 mm Liver: Echogenic hepatic parenchyma consistent with fatty liver infiltration. With this level of echogenicity evaluation for underlying mass lesion is limited and if needed follow-up contrast CT or MRI as clinically directed. Portal vein is patent on color Doppler imaging with normal direction of blood flow towards the liver. Other: Evaluation limited by overlapping bowel gas and soft tissue as per the sonographer. IMPRESSION: Fatty liver infiltration. In the location of the gallbladder is a  shadowing echogenic area. This could be a gallbladder filled with stones but difficult to confirm on this examination. Recommend further workup such as MRCP. No biliary ductal dilatation. Electronically Signed   By: Karen Kays M.D.   On: 03/18/2023 11:13    Procedures Procedures    Medications Ordered in ED Medications - No data to display  ED Course/ Medical Decision Making/ A&P Clinical Course as of 03/18/23 1329  Thu Mar 18, 2023  1129 Discussed with Remi Haggard with general surgery who will see patient. Likely needs GI work-up.  [CA]  1301 Discussed with Marchelle Folks with GI who will see patient.  [CA]    Clinical Course  User Index [CA] Mannie Stabile, PA-C                                 Medical Decision Making Amount and/or Complexity of Data Reviewed External Data Reviewed: notes.    Details: Neurology note Labs: ordered. Decision-making details documented in ED Course. Radiology: ordered and independent interpretation performed. Decision-making details documented in ED Course. ECG/medicine tests: ordered and independent interpretation performed. Decision-making details documented in ED Course.  Risk Decision regarding hospitalization.   This patient presents to the ED for concern of transaminitis, this involves an extensive number of treatment options, and is a complaint that carries with it a high risk of complications and morbidity.  The differential diagnosis includes acute cholecystitis,   42 year old female presents to the ED due to transaminitis.  Had labs drawn yesterday and found to have a significant transaminitis.  Had [employer] yesterday and developed sudden onset of right upper quadrant and epigastric pain.  Gallbladder intact.  Similar episode 3 months ago.  Upon arrival patient afebrile, not tachycardic or hypoxic.  Patient in no acute distress.  Abdomen soft, nondistended, nontender.  Patient well-appearing on exam.  Routine labs ordered in triage which  confirms transaminitis.  AST 302, ALT 741, alk phos 152, total bilirubin 4.4.  Right upper quadrant ultrasound ordered.  Added acetaminophen level and hepatitis panel.  Lipase elevated 132.  CBC with no leukocytosis and normal hemoglobin.  UA significant for bilirubin however, no signs of infection.  CMP with transaminitis.  AST at 302, ALT 741, alk phos 152.  Total bilirubin 4.4.  Acetaminophen level within normal limits.  Right upper quadrant ultrasound demonstrates shadowing echogenic area which could be gallstone filled with stones. Discussed with GI and general surgery as noted above.  Patient will require admission for transaminitis and further workup. MRCP ordered per GI.   1:22 PM Discussed with Dr. Hyacinth Meeker with family practice who agrees to admit patient.   Co morbidities that complicate the patient evaluation  HTN, anxiety  Social Determinants of Health:  Has PCP  Test / Admission - Considered:  Patient will require admission to trend LFTs.         Final Clinical Impression(s) / ED Diagnoses Final diagnoses:  Transaminitis    Rx / DC Orders ED Discharge Orders     None         Mannie Stabile, PA-C 03/18/23 1331    [place], [place] 03/18/23 1537

## 2023-03-18 NOTE — Telephone Encounter (Signed)
Noted  

## 2023-03-18 NOTE — ED Notes (Signed)
Pt states she feels find, just "sluggish"

## 2023-03-18 NOTE — Consult Note (Addendum)
Referring Provider: Dr. Terisa Starr  Primary Care Physician:  Latrelle Dodrill, MD Primary Gastroenterologist: Gentry Fitz  Reason for Consultation: Elevated LFTs, upper abdominal pain  HPI: Anna Anderson is a 42 y.o. female with a past medical history of anxiety, hypertension, vitamin D deficiency, pseudotumor cerebri and migraine headaches. Past C-section x 3 and tubal ligation.  She developed N/V and epigastric pain on Monday 03/15/2023 after eating [employer]. Emesis was food she had eaten, no hematemesis. She also notice her urine was orange. Tuesday 12/17, she continued to have N/V and epigastric pain. She felt constipated so she took magnesium citrate which resulted in passing a small amount of nonbloody stool without symptom relief.  She was seen by her neurologist on 03/17/2023 who did labs which showed acute transaminitis and she continued to have N/V and epigastric pain therefore she was instructed to go to the ED for further evaluation. She initially thought her GI upset was secondary to Topamax which she takes for migraine headaches. She endorses losing 25 pounds over the past 4 months.  No fevers.  No alcohol use.  No drug use.  No recent acetaminophen use. She stated taking an antibiotic for vaginitis approximately 3 weeks ago, no other new medications.  No family history of liver disease.  Labs 03/17/2023 done by her PCP showed a WBC count of 7.8.  Hemoglobin 13.8.  Total bili 3.1.  Alk phos 188.  AST 447.  ALT 793.  Labs in the ED today showed a WBC count of 5.9.  Hemoglobin 13.4.  Platelet 286.  Potassium 3.3.  BUN 9.  Creatinine 1.05.  Calcium 8.8.  Total bili 4.4.  Alk phos 152.  AST 302.  ALT 741.  Lipase 132.  hCG negative.  Acetaminophen level < 10.  Acute hepatitis panel negative.  Urinalysis with moderate bilirubin.  RUQ sonogram showed evidence of fatty liver, and a shadowing echogenic area in the location of the gallbladder without biliary ductal dilatation.   She was evaluated by general surgery who did not assess the patient did not require surgical intervention. A GI consult was requested for further evaluation.   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  Past Medical History:  Diagnosis Date   Abdominal pain 01/01/2020   Concussion    Constipation 10/25/2017   Hypertension    Left lower quadrant abdominal pain 10/20/2018   Post concussive syndrome 05/18/2016   Right facial swelling 08/12/2017   Vitamin D deficiency 07/24/2014    Past Surgical History:  Procedure Laterality Date   CESAREAN SECTION     x3   TUBAL LIGATION      Prior to Admission medications   Medication Sig Start Date End Date Taking? Authorizing Provider  amLODipine (NORVASC) 10 MG tablet TAKE 1 TABLET(10 MG) BY MOUTH DAILY. Need MD appt before further refills. 02/05/23  Yes Pray, Milus Mallick, MD  hydrochlorothiazide (HYDRODIURIL) 25 MG tablet TAKE 1 TABLET(25 MG) BY MOUTH DAILY 10/07/22  Yes Pray, Milus Mallick, MD  levothyroxine (SYNTHROID) 50 MCG tablet TAKE 1 TABLET BY MOUTH EVERY DAY 03/03/23  Yes Latrelle Dodrill, MD  potassium chloride SA (KLOR-CON M) 20 MEQ tablet TAKE 2 TABLETS(40 MEQ) BY MOUTH  DAILY Patient taking differently: Take 20 mEq by mouth daily as needed. 07/28/21  Yes Latrelle Dodrill, MD  topiramate (TOPAMAX) 100 MG tablet Take 1 tablet (100 mg total) by mouth 2 (two) times daily. Patient taking differently: Take 200 mg by mouth at bedtime. 11/04/22  Yes Levert Feinstein, MD  triamcinolone ointment (KENALOG) 0.5 % APPLY TOPICALLY TO THE AFFECTED AREA TWICE DAILY FOR MODERATE TO SEVERE ECZEMA.  DO NOT USE FOR MORE THAN 1 WEEK AT A TIME Patient taking differently: Apply 1 Application topically daily as needed (eczema). 07/03/22   Latrelle Dodrill, MD    Current Facility-Administered Medications  Medication Dose Route Frequency Provider Last Rate Last Admin   amLODipine (NORVASC) tablet 10 mg  10 mg Oral Daily Gerrit Heck, DO       enoxaparin (LOVENOX) injection 40 mg  40 mg Subcutaneous Q24H Hyacinth Meeker, Samantha, DO       hydrochlorothiazide (HYDRODIURIL) tablet 25 mg  25 mg Oral Daily Gerrit Heck, DO       levothyroxine (SYNTHROID) tablet 50 mcg  50 mcg Oral Daily Hyacinth Meeker, Samantha, DO       sodium chloride 0.9 % bolus 500 mL  500 mL Intravenous Once Gerrit Heck, DO       Current Outpatient Medications  Medication Sig Dispense Refill   amLODipine (NORVASC) 10 MG tablet TAKE 1 TABLET(10 MG) BY MOUTH DAILY. Need MD appt before further refills. 30 tablet 0   hydrochlorothiazide (HYDRODIURIL) 25 MG tablet TAKE 1 TABLET(25 MG) BY MOUTH DAILY 90 tablet 0   levothyroxine (SYNTHROID) 50 MCG tablet TAKE 1 TABLET BY MOUTH EVERY DAY 90 tablet 1   potassium chloride SA (KLOR-CON M) 20 MEQ tablet TAKE 2 TABLETS(40 MEQ) BY MOUTH DAILY (Patient taking differently: Take 20 mEq by mouth daily as needed.) 180 tablet 0   topiramate (TOPAMAX) 100 MG tablet Take 1 tablet (100 mg total) by mouth 2 (two) times daily. (Patient taking differently: Take 200 mg by mouth at bedtime.) 60 tablet 11   triamcinolone ointment (KENALOG) 0.5 % APPLY TOPICALLY TO THE AFFECTED AREA TWICE DAILY FOR MODERATE TO SEVERE ECZEMA. DO NOT USE FOR MORE THAN 1 WEEK AT A TIME (Patient taking differently: Apply 1 Application topically daily as needed (eczema).) 60 g 3    Allergies as of 03/18/2023 - Review Complete 03/18/2023  Allergen Reaction Noted   Amoxicillin Swelling 10/19/2007   Lisinopril Cough 07/24/2014    Family History  Problem Relation Age of Onset   Diabetes Mother    Heart disease Mother     Healthy Father     Social History   Socioeconomic History   Marital status: Single    Spouse name: Not on file   Number of children: 4   Years of education: GED   Highest education level: Not on file  Occupational History   Occupation: Dietary Aid  Tobacco Use   Smoking status: Never   Smokeless tobacco: Never  Vaping Use   Vaping status: Never Used  Substance and Sexual Activity   Alcohol use: No   Drug use: No   Sexual activity: Yes    Birth control/protection: Surgical  Other Topics Concern   Not on file  Social History Narrative   Lives at home with her four children.   Right-handed.   3-4 cups caffeine some days.   Social Drivers of Corporate investment banker Strain: Not on file  Food Insecurity: Not on file  Transportation Needs: Not on file  Physical Activity: Not on file  Stress: Not on file  Social Connections: Not on file  Intimate Partner Violence: Not on file   Review of Systems: Gen: See HPI. CV: Denies chest pain, palpitations or edema. Resp: Denies cough, shortness of breath of hemoptysis.  GI: See HPI. GU : Denies urinary burning, blood in urine, increased urinary frequency or incontinence. MS: Denies joint pain, muscles aches or weakness. Derm: Denies rash, itchiness, skin lesions or unhealing ulcers. Psych: Denies depression, anxiety, memory loss or confusion. Heme: Denies easy bruising, bleeding. Neuro: + Paresthesias. Endo:  Denies any problems with DM, thyroid or adrenal function.  Physical Exam: Vital signs in last 24 hours: Temp:  [97.9 F (36.6 C)-98 F (36.7 C)] 97.9 F (36.6 C) (12/19 1158) Pulse Rate:  [56-74] 56 (12/19 1158) Resp:  [16-17] 17 (12/19 1158) BP: (136-157)/(91-102) 151/91 (12/19 1158) SpO2:  [100 %] 100 % (12/19 1158) Weight:  [101.8 kg] 101.8 kg (12/18 1547)   General:  Alert 42 year old female in no acute distress. Head:  Normocephalic and atraumatic. Eyes:  Trace scleral icterus. Conjunctiva pink. Ears:   Normal auditory acuity. Nose:  No deformity, discharge or lesions. Mouth:  Dentition intact. No ulcers or lesions.  Neck:  Supple. No lymphadenopathy or thyromegaly.  Lungs: Breath sounds clear throughout. No wheezes, rhonchi or crackles.  Heart: Regular rate and rhythm, no murmurs. Abdomen: Obese abdomen, soft.  Mild tenderness to the epigastric area without rebound or guarding.  Positive bowel sounds all 4 quadrants.  No palpable mass.  No hepatosplenomegaly.  No bruit. Rectal: Deferred. Musculoskeletal:  Symmetrical without gross deformities.  Pulses:  Normal pulses noted. Extremities:  Without clubbing or edema. Neurologic:  Alert and  oriented x 4. No focal deficits.  Skin:  Intact without significant lesions or rashes. Psych:  Alert and cooperative. Normal mood and affect.  Intake/Output from previous day: No intake/output data recorded. Intake/Output this shift: No intake/output data recorded.  Lab Results: Recent Labs    03/17/23 1619 03/18/23 0737  WBC 7.8 5.9  HGB 13.8 13.4  HCT 41.4 42.9  PLT 298 286   BMET Recent Labs    03/17/23 1619 03/18/23 0737  NA 139 137  K 3.4* 3.3*  CL 107* 112*  CO2 16* 18*  GLUCOSE 93 97  BUN 10 9  CREATININE 1.08* 1.05*  CALCIUM 9.7 8.8*   LFT Recent Labs    03/18/23 0737  PROT 7.9  ALBUMIN 3.5  AST 302*  ALT 741*  ALKPHOS 152*  BILITOT 4.4*   PT/INR No results for input(s): "LABPROT", "INR" in the last 72 hours. Hepatitis Panel Recent Labs    03/18/23 1134  HEPBSAG NON REACTIVE  HCVAB NON REACTIVE  HEPAIGM NON REACTIVE  HEPBIGM NON REACTIVE      Studies/Results: US Abdomen Limited RUQ (LIVER/GB) Result Date: 03/18/2023 CLINICAL DATA:  Elevated liver function tests EXAM: ULTRASOUND ABDOMEN LIMITED RIGHT UPPER QUADRANT COMPARISON:  None Available. FINDINGS: Gallbladder: Gallbladder itself is poorly seen. There is a echogenic shadowing area in the expected location of the gallbladder. This could be a  gallbladder filled with stones but poorly defined. Common bile duct: Diameter: 3 mm Liver: Echogenic hepatic parenchyma consistent with fatty liver infiltration. With this level of echogenicity evaluation for underlying mass lesion is limited and if needed follow-up contrast CT or MRI as clinically directed. Portal vein is patent on color Doppler imaging with normal direction of blood flow towards the liver. Other: Evaluation limited by overlapping bowel gas and soft  tissue as per the sonographer. IMPRESSION: Fatty liver infiltration. In the location of the gallbladder is a shadowing echogenic area. This could be a gallbladder filled with stones but difficult to confirm on this examination. Recommend further workup such as MRCP. No biliary ductal dilatation. Electronically Signed   By: Karen Kays M.D.   On: 03/18/2023 11:13    IMPRESSION/PLAN:  42 year old female admitted to the hospital with acute transaminitis with N/V and epigastric pain x 3 days. Total bili 4.4. Alk phos 152. AST 302. ALT 741. Lipase 132. Acute hepatitis panel negative. RUQ sonogram showed evidence of fatty liver and a shadowing echogenic area in the location of the gallbladder without biliary ductal dilatation. Abdominal MRI/MRCP showed cholelithiasis without evidence of acute cholecystitis or biliary ductal dilatation, hepatomegaly and hepatic steatosis.  Unlikely DILI from Topiramate, hepatotoxicity with this medication is rare.  Patient could not recall which antibiotic she took for 7 days approximately 3 weeks ago for vaginitis, possibly Metronidazole.  -Hep A total antibody, Hep B surface antibody, Hep B core total antibody, Hep C RNA quant, ANA, SMA, AMA, IgG, A1AT, ceruloplasmin, iron, ferritin and CK -PT/INR and HIV ordered by the medical service  -Hepatic panel in a.m. -Clear liquid diet liquid diet -IV fluids and pain management per the hospitalist -Ondansetron 4 mg p.o. 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 Leone Payor to review MRI/MRCP, verify if future EUS warranted    Arnaldo Natal  03/18/2023, 4:10 PM      [facility] Attending   I have taken a history, reviewed the chart and examined the patient. I agree with the Advanced Practitioner's note, impression and recommendations with the following additions:  Acute syndrome of nausea vomiting jaundice and marked transaminitis without evidence for biliary pathology causing it.  She does have gallstones but these are incidental we think.  Fatty liver not the primary issue either.  LFTs normal previously.  Serologic workup as above.  I wonder about autoimmune hepatitis.  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  There is a mildly elevated lipase that is probably unrelated to that.  At some point this needs follow-up which could be an EUS electively.  I do not think it is playing a role in her current acute health problem.  We will follow.  Await results of serologic workup.  The medical decision making is high complexity and the majority of the decision making was performed by me.  Iva Boop, MD, [facility] See Loretha Stapler on call - gastroenterology for best contact person 03/18/2023 5:09 PM

## 2023-03-19 DIAGNOSIS — N179 Acute kidney failure, unspecified: Secondary | ICD-10-CM

## 2023-03-19 DIAGNOSIS — E876 Hypokalemia: Secondary | ICD-10-CM | POA: Diagnosis not present

## 2023-03-19 DIAGNOSIS — R748 Abnormal levels of other serum enzymes: Secondary | ICD-10-CM | POA: Diagnosis not present

## 2023-03-19 DIAGNOSIS — R7401 Elevation of levels of liver transaminase levels: Secondary | ICD-10-CM

## 2023-03-19 DIAGNOSIS — K760 Fatty (change of) liver, not elsewhere classified: Secondary | ICD-10-CM | POA: Diagnosis not present

## 2023-03-19 DIAGNOSIS — K862 Cyst of pancreas: Secondary | ICD-10-CM | POA: Diagnosis not present

## 2023-03-19 DIAGNOSIS — R110 Nausea: Secondary | ICD-10-CM

## 2023-03-19 DIAGNOSIS — K8020 Calculus of gallbladder without cholecystitis without obstruction: Secondary | ICD-10-CM | POA: Diagnosis not present

## 2023-03-19 DIAGNOSIS — K8050 Calculus of bile duct without cholangitis or cholecystitis without obstruction: Secondary | ICD-10-CM

## 2023-03-19 LAB — COMPREHENSIVE METABOLIC PANEL
ALT: 523 U/L — ABNORMAL HIGH (ref 0–44)
AST: 119 U/L — ABNORMAL HIGH (ref 15–41)
Albumin: 3.2 g/dL — ABNORMAL LOW (ref 3.5–5.0)
Alkaline Phosphatase: 129 U/L — ABNORMAL HIGH (ref 38–126)
Anion gap: 9 (ref 5–15)
BUN: 6 mg/dL (ref 6–20)
CO2: 17 mmol/L — ABNORMAL LOW (ref 22–32)
Calcium: 8.8 mg/dL — ABNORMAL LOW (ref 8.9–10.3)
Chloride: 110 mmol/L (ref 98–111)
Creatinine, Ser: 1.26 mg/dL — ABNORMAL HIGH (ref 0.44–1.00)
GFR, Estimated: 55 mL/min — ABNORMAL LOW (ref >60)
Glucose, Bld: 89 mg/dL (ref 70–99)
Potassium: 3.1 mmol/L — ABNORMAL LOW (ref 3.5–5.1)
Sodium: 136 mmol/L (ref 135–145)
Total Bilirubin: 1.6 mg/dL — ABNORMAL HIGH (ref <1.2)
Total Protein: 7.2 g/dL (ref 6.5–8.1)

## 2023-03-19 LAB — CERULOPLASMIN: Ceruloplasmin: 38.5 mg/dL (ref 19.0–39.0)

## 2023-03-19 LAB — SPECIMEN STATUS REPORT

## 2023-03-19 LAB — HCV RNA QUANT: HCV Quantitative: NOT DETECTED [IU]/mL (ref >50)

## 2023-03-19 LAB — LIPASE, BLOOD: Lipase: 69 U/L — ABNORMAL HIGH (ref 11–51)

## 2023-03-19 LAB — CBC
HCT: 39.2 % (ref 36.0–46.0)
Hemoglobin: 12.9 g/dL (ref 12.0–15.0)
MCH: 29.3 pg (ref 26.0–34.0)
MCHC: 32.9 g/dL (ref 30.0–36.0)
MCV: 89.1 fL (ref 80.0–100.0)
Platelets: 267 10*3/uL (ref 150–400)
RBC: 4.4 MIL/uL (ref 3.87–5.11)
RDW: 14.5 % (ref 11.5–15.5)
WBC: 5.5 10*3/uL (ref 4.0–10.5)
nRBC: 0 % (ref 0.0–0.2)

## 2023-03-19 LAB — IGG: IgG (Immunoglobin G), Serum: 1814 mg/dL — ABNORMAL HIGH (ref 586–1602)

## 2023-03-19 LAB — ANA: Anti Nuclear Antibody (ANA): NEGATIVE

## 2023-03-19 LAB — ALPHA-1-ANTITRYPSIN: A-1 Antitrypsin, Ser: 159 mg/dL (ref 101–187)

## 2023-03-19 LAB — LIPASE: Lipase: 180 U/L — ABNORMAL HIGH (ref 14–72)

## 2023-03-19 MED ORDER — METRONIDAZOLE 500 MG/100ML IV SOLN
500.0000 mg | Freq: Two times a day (BID) | INTRAVENOUS | Status: DC
  Administered 2023-03-19 – 2023-03-20 (×2): 500 mg via INTRAVENOUS
  Filled 2023-03-19 (×2): qty 100

## 2023-03-19 MED ORDER — ONDANSETRON HCL 4 MG PO TABS
4.0000 mg | ORAL_TABLET | Freq: Three times a day (TID) | ORAL | Status: DC | PRN
  Administered 2023-03-19: 4 mg via ORAL
  Filled 2023-03-19: qty 1

## 2023-03-19 MED ORDER — POTASSIUM CHLORIDE CRYS ER 20 MEQ PO TBCR
40.0000 meq | EXTENDED_RELEASE_TABLET | Freq: Once | ORAL | Status: AC
  Administered 2023-03-19: 40 meq via ORAL
  Filled 2023-03-19: qty 2

## 2023-03-19 MED ORDER — SODIUM CHLORIDE 0.9 % IV SOLN: 2.0000 g | Freq: Once | INTRAVENOUS | Status: DC

## 2023-03-19 MED ORDER — LACTATED RINGERS IV BOLUS
1000.0000 mL | Freq: Once | INTRAVENOUS | Status: AC
  Administered 2023-03-19: 1000 mL via INTRAVENOUS

## 2023-03-19 MED ORDER — CIPROFLOXACIN IN D5W 400 MG/200ML IV SOLN
400.0000 mg | Freq: Two times a day (BID) | INTRAVENOUS | Status: DC
  Administered 2023-03-19 – 2023-03-20 (×2): 400 mg via INTRAVENOUS
  Filled 2023-03-19 (×2): qty 200

## 2023-03-19 NOTE — Plan of Care (Signed)
  Problem: Education: Goal: Knowledge of General Education information will improve Description: Including pain rating scale, medication(s)/side effects and non-pharmacologic comfort measures Outcome: Progressing   Problem: Clinical Measurements: Goal: Ability to maintain clinical measurements within normal limits will improve Outcome: Progressing   Problem: Clinical Measurements: Goal: Diagnostic test results will improve Outcome: Progressing   Problem: Nutrition: Goal: Adequate nutrition will be maintained Outcome: Progressing

## 2023-03-19 NOTE — Plan of Care (Signed)

## 2023-03-19 NOTE — Progress Notes (Signed)
     Daily Progress Note Intern Pager: [phone]  Patient name: Anna Anderson Medical record number: [id number] Date of birth: 05-May-1980 Age: 42 y.o. Gender: female  Primary Care Provider: Latrelle Dodrill, MD Consultants: GI, General Surgery Code Status: Full  Pt Overview and Major Events to Date:  12/19: Admitted  Assessment and Plan:  Anna Anderson is a 42 year old lady presenting with a second episode of right upper quadrant and epigastric pain with nausea and vomiting.  She has a history of hypertension, subclinical hypothyroid and pseudotumor cerebri.  She is currently stable and awaiting recommendations from GI and surgery for treatment. Assessment & Plan Transaminitis Thus far further workup from GI has revealed only confirmation of stones in the gallbladder, and pancreatic head cyst which they do not feel is contributory to her current presentation.  Labs have improved significantly this morning.  Autoimmune hepatitis workup pending per -Med-surg -GI following, awaiting results of autoimmune hepatitis workup -Surgery following, no new recommendations at this time -AM CMP, CBC RUQ abdominal pain Continues to improve: Patient with some nausea after addition of liquid diet today. - PRN zofran for nausea/vomiting - consider pain management if needed/desired.  AKI (acute kidney injury) (HCC) Creatinine continues to rise likely in the setting of poor p.o. intake.  Value was 1.26 today - one-time bolus of 1 L normal saline -encourage PO intake following procedures.  -Continue to monitor a.m. labs -Replete electrolytes as appropriate  Chronic and Stable Problems:  HTN: Continue home amlodipine 10 mg, HCTZ 25 mg Subclinical hypothyroidism: Continue home levothyroxine 50 mcg Pseudotumor cerebri: holding home Topamax, could restart as GI does not feel this is the cause of her transaminitis  FEN/GI: Liquid diet PPx: Lovenox Dispo:Home pending clinical improvement .     Subjective:  Patient awake resting comfortably in bed on exam this morning.  She does endorse some nausea with addition of diet today.  She is concerned about remaining off of her Topamax as she had issues with discontinuation in the past.  Objective: Temp:  [97.4 F (36.3 C)-98.7 F (37.1 C)] 98.7 F (37.1 C) (12/20 0447) Pulse Rate:  [56-62] 58 (12/20 0447) Resp:  [17-20] 20 (12/20 0447) BP: (124-153)/(80-99) 124/80 (12/20 0447) SpO2:  [96 %-100 %] 100 % (12/20 0447) Weight:  [100.6 kg] 100.6 kg (12/19 1600) Physical Exam: General: Well-appearing female no distress Cardiovascular: RRR, no M/R/G Respiratory: CTAB, no increased work of breathing Abdomen: Soft, nontender.  No guarding or masses Extremities: No evidence of peripheral edema  Laboratory: Most recent CBC Lab Results  Component Value Date   WBC 5.5 03/19/2023   HGB 12.9 03/19/2023   HCT 39.2 03/19/2023   MCV 89.1 03/19/2023   PLT 267 03/19/2023   Most recent BMP    Latest Ref Rng & Units 03/19/2023    6:10 AM  BMP  Glucose 70 - 99 mg/dL 89   BUN 6 - 20 mg/dL 6   Creatinine 6.57 - 8.46 mg/dL 9.62   Sodium 952 - 841 mmol/L 136   Potassium 3.5 - 5.1 mmol/L 3.1   Chloride 98 - 111 mmol/L 110   CO2 22 - 32 mmol/L 17   Calcium 8.9 - 10.3 mg/dL 8.8    Gerrit Heck, DO 03/19/2023, 7:52 AM  PGY-1, [facility] FPTS Intern pager: [phone], text pages welcome Secure chat group [facility] Teaching Service

## 2023-03-19 NOTE — Assessment & Plan Note (Addendum)
Thus far further workup from GI has revealed only confirmation of stones in the gallbladder, and pancreatic head cyst which they do not feel is contributory to her current presentation.  Labs have improved significantly this morning.  Autoimmune hepatitis workup pending per -Med-surg -GI following, awaiting results of autoimmune hepatitis workup -Surgery following, no new recommendations at this time -AM CMP, CBC

## 2023-03-19 NOTE — Assessment & Plan Note (Addendum)
Continues to improve: Patient with some nausea after addition of liquid diet today. - PRN zofran for nausea/vomiting - consider pain management if needed/desired.

## 2023-03-19 NOTE — Progress Notes (Addendum)
Gastroenterology Progress Note  CC:  Elevated LFTs, upper abdominal pain     Subjective: She has mild nausea this morning, prefers not to take an antiemetic at this time.  No vomiting.  No abdominal pain.  No chest pain or shortness of breath.   Objective:  Vital signs in last 24 hours: Temp:  [97.4 F (36.3 C)-98.7 F (37.1 C)] 98.6 F (37 C) (12/20 0903) Pulse Rate:  [56-83] 83 (12/20 0903) Resp:  [17-20] 19 (12/20 0903) BP: (124-153)/(80-99) 128/91 (12/20 0903) SpO2:  [96 %-100 %] 100 % (12/20 0903) Weight:  [100.6 kg] 100.6 kg (12/19 1600) Last BM Date : 03/18/23 General: Alert 42 year old female in no acute distress. Heart: Regular rate and rhythm, no murmurs. Pulm: Few crackles LLL. Abdomen: Soft.  Nondistended.  Nontender.  Positive bowel sounds to all 4 quadrants.  No palpable hepatosplenomegaly in setting of obese abdomen. Extremities: No lower extremity edema. Neurologic:  Alert and  oriented x 4. Grossly normal neurologically. Psych:  Alert and cooperative. Normal mood and affect.  Intake/Output from previous day: 12/19 0701 - 12/20 0700 In: 360 [P.O.:360] Out: 1 [Stool:1] Intake/Output this shift: No intake/output data recorded.  Lab Results: Recent Labs    03/17/23 1619 03/18/23 0737 03/19/23 0610  WBC 7.8 5.9 5.5  HGB 13.8 13.4 12.9  HCT 41.4 42.9 39.2  PLT 298 286 267   BMET Recent Labs    03/17/23 1619 03/18/23 0737 03/19/23 0610  NA 139 137 136  K 3.4* 3.3* 3.1*  CL 107* 112* 110  CO2 16* 18* 17*  GLUCOSE 93 97 89  BUN 10 9 6   CREATININE 1.08* 1.05* 1.26*  CALCIUM 9.7 8.8* 8.8*   LFT Recent Labs    03/19/23 0610  PROT 7.2  ALBUMIN 3.2*  AST 119*  ALT 523*  ALKPHOS 129*  BILITOT 1.6*   PT/INR Recent Labs    03/18/23 1634  LABPROT 15.9*  INR 1.2   Hepatitis Panel Recent Labs    03/18/23 1134  HEPBSAG NON REACTIVE  HCVAB NON REACTIVE  HEPAIGM NON REACTIVE  HEPBIGM NON REACTIVE    MR ABDOMEN MRCP W  WO CONTAST Result Date: 03/18/2023 CLINICAL DATA:  Right upper quadrant abdominal pain, cholelithiasis suspected EXAM: MRI ABDOMEN WITHOUT AND WITH CONTRAST (INCLUDING MRCP) TECHNIQUE: Multiplanar multisequence MR imaging of the abdomen was performed both before and after the administration of intravenous contrast. Heavily T2-weighted images of the biliary and pancreatic ducts were obtained, and three-dimensional MRCP images were rendered by post processing. CONTRAST:  10mL GADAVIST GADOBUTROL 1 MMOL/ML IV SOLN COMPARISON:  Right upper quadrant ultrasound, 03/18/2023 FINDINGS: Lower chest: No acute abnormality.  Cardiomegaly. Hepatobiliary: No solid liver abnormality is seen. Hepatomegaly, maximum coronal span 21.7 cm. Hepatic steatosis. Numerous gallstones. No gallbladder wall thickening or biliary ductal dilatation. Pancreas: Thinly septated fluid signal cystic lesion within the central pancreatic head anterior to the common bile duct, probably communicating with the adjacent main pancreatic duct, measuring 1.3 x 1.2 cm (series 5,). No solid component or suspicious contrast enhancement. No pancreatic ductal dilatation or surrounding inflammatory changes. Spleen: Normal in size without significant abnormality. Adrenals/Urinary Tract: Adrenal glands are unremarkable. Simple, benign bilateral renal cortical cysts, for which no further follow-up or characterization. Kidneys are otherwise normal, without renal calculi, solid lesion, or hydronephrosis. Stomach/Bowel: Stomach is within normal limits. No evidence of bowel wall thickening, distention, or inflammatory changes. Descending colonic diverticulosis. Vascular/Lymphatic: No significant vascular findings are present. No enlarged abdominal lymph nodes.  Other: No abdominal wall hernia or abnormality. No ascites. Musculoskeletal: No acute or significant osseous findings. IMPRESSION: 1. Cholelithiasis without evidence of acute cholecystitis. No biliary ductal  dilatation. 2. Thinly septated fluid signal cystic lesion within the central pancreatic head anterior to the common bile duct, probably communicating with the adjacent main pancreatic duct, measuring 1.3 x 1.2 cm. No solid component or suspicious contrast enhancement. No pancreatic ductal dilatation or surrounding inflammatory changes. 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 3. Hepatomegaly and hepatic steatosis. 4. Descending colonic diverticulosis. 5. Cardiomegaly. Electronically Signed   By: Jearld Lesch M.D.   On: 03/18/2023 15:21   MR 3D Recon At Scanner Result Date: 03/18/2023 CLINICAL DATA:  Right upper quadrant abdominal pain, cholelithiasis suspected EXAM: MRI ABDOMEN WITHOUT AND WITH CONTRAST (INCLUDING MRCP) TECHNIQUE: Multiplanar multisequence MR imaging of the abdomen was performed both before and after the administration of intravenous contrast. Heavily T2-weighted images of the biliary and pancreatic ducts were obtained, and three-dimensional MRCP images were rendered by post processing. CONTRAST:  10mL GADAVIST GADOBUTROL 1 MMOL/ML IV SOLN COMPARISON:  Right upper quadrant ultrasound, 03/18/2023 FINDINGS: Lower chest: No acute abnormality.  Cardiomegaly. Hepatobiliary: No solid liver abnormality is seen. Hepatomegaly, maximum coronal span 21.7 cm. Hepatic steatosis. Numerous gallstones. No gallbladder wall thickening or biliary ductal dilatation. Pancreas: Thinly septated fluid signal cystic lesion within the central pancreatic head anterior to the common bile duct, probably communicating with the adjacent main pancreatic duct, measuring 1.3 x 1.2 cm (series 5,). No solid component or suspicious contrast enhancement. No pancreatic ductal dilatation or surrounding inflammatory changes. Spleen: Normal in size without significant abnormality. Adrenals/Urinary Tract: Adrenal glands are unremarkable.  Simple, benign bilateral renal cortical cysts, for which no further follow-up or characterization. Kidneys are otherwise normal, without renal calculi, solid lesion, or hydronephrosis. Stomach/Bowel: Stomach is within normal limits. No evidence of bowel wall thickening, distention, or inflammatory changes. Descending colonic diverticulosis. Vascular/Lymphatic: No significant vascular findings are present. No enlarged abdominal lymph nodes. Other: No abdominal wall hernia or abnormality. No ascites. Musculoskeletal: No acute or significant osseous findings. IMPRESSION: 1. Cholelithiasis without evidence of acute cholecystitis. No biliary ductal dilatation. 2. Thinly septated fluid signal cystic lesion within the central pancreatic head anterior to the common bile duct, probably communicating with the adjacent main pancreatic duct, measuring 1.3 x 1.2 cm. No solid component or suspicious contrast enhancement. No pancreatic ductal dilatation or surrounding inflammatory changes. 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 3. Hepatomegaly and hepatic steatosis. 4. Descending colonic diverticulosis. 5. Cardiomegaly. Electronically Signed   By: Jearld Lesch M.D.   On: 03/18/2023 15:21   US Abdomen Limited RUQ (LIVER/GB) Result Date: 03/18/2023 CLINICAL DATA:  Elevated liver function tests EXAM: ULTRASOUND ABDOMEN LIMITED RIGHT UPPER QUADRANT COMPARISON:  None Available. FINDINGS: Gallbladder: Gallbladder itself is poorly seen. There is a echogenic shadowing area in the expected location of the gallbladder. This could be a gallbladder filled with stones but poorly defined. Common bile duct: Diameter: 3 mm Liver: Echogenic hepatic parenchyma consistent with fatty liver infiltration. With this level of echogenicity evaluation for underlying mass lesion is limited and if needed follow-up contrast CT or MRI as clinically directed.  Portal vein is patent on color Doppler imaging with normal direction of blood flow towards the liver. Other: Evaluation limited by overlapping bowel gas and soft tissue  as per the sonographer. IMPRESSION: Fatty liver infiltration. In the location of the gallbladder is a shadowing echogenic area. This could be a gallbladder filled with stones but difficult to confirm on this examination. Recommend further workup such as MRCP. No biliary ductal dilatation. Electronically Signed   By: Karen Kays M.D.   On: 03/18/2023 11:13   Patient profile:  Anna Anderson is a 42 y.o. female with a past medical history of anxiety, hypertension, vitamin D deficiency, pseudotumor cerebri and migraine headaches. Past C-section x 3 and tubal ligation. Admitted to the hospital 12/19 with N/V, epigastric pain and elevated LFTS.  Assessment / Plan:  42 year old female admitted to the hospital with acute transaminitis with N/V and epigastric pain x 3 days. Total bili 4.4. Alk phos 152. AST 302. ALT 741. Lipase 132 -> 69. Acute hepatitis panel negative.  HIV negative.  Iron 66. Ferritin 105. CK1 80.  INR 1.2.  ANA, SMA, ANA, IgG, alpha 1 antitrypsin and HCV RNA levels pending.  RUQ sonogram showed evidence of fatty liver and a shadowing echogenic area in the location of the gallbladder without biliary ductal dilatation. Abdominal MRI/MRCP showed cholelithiasis without evidence of acute cholecystitis or biliary ductal dilatation, hepatomegaly and hepatic steatosis. Unlikely DILI from Topiramate, hepatotoxicity with this medication is rare.  LFTS drifting downward. Persistent mild nausea without further vomiting. She possibly had CBD stones which passed spontaneously prior to MRCP, patient noted having similar N/V and upper abdominal pain for a few days 2 months ago.  -Await further hepatology serologies -Hepatic panel in a.m. -Clear liquid diet liquid diet for now -Await further general surgery recommendations -IV fluids and  pain management per the hospitalist -Ondansetron 4 mg p.o. 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 No pancreatic inflammation to suggest acute pancreatitis. Lipase 132 -> 69. -Eventual EUS as an outpatient    Principal Problem:   Transaminitis Active Problems:   RUQ abdominal pain   AKI (acute kidney injury) (HCC)   Gallstones   Pancreatic cyst     LOS: 1 day   Arnaldo Natal  03/19/2023, 10:32 AM

## 2023-03-19 NOTE — Assessment & Plan Note (Addendum)
Creatinine continues to rise likely in the setting of poor p.o. intake.  Value was 1.26 today - one-time bolus of 1 L normal saline -encourage PO intake following procedures.  -Continue to monitor a.m. labs -Replete electrolytes as appropriate

## 2023-03-20 ENCOUNTER — Inpatient Hospital Stay (HOSPITAL_COMMUNITY): Payer: Medicaid Other

## 2023-03-20 ENCOUNTER — Encounter (HOSPITAL_COMMUNITY)
Admission: EM | Disposition: A | Discharge status: Home / Self Care | Payer: Self-pay | Source: Home / Self Care | Attending: Family Medicine

## 2023-03-20 ENCOUNTER — Other Ambulatory Visit: Payer: Self-pay

## 2023-03-20 ENCOUNTER — Inpatient Hospital Stay (HOSPITAL_COMMUNITY): Payer: Medicaid Other | Admitting: Anesthesiology

## 2023-03-20 ENCOUNTER — Encounter (HOSPITAL_COMMUNITY): Payer: Self-pay | Admitting: Family Medicine

## 2023-03-20 DIAGNOSIS — K8044 Calculus of bile duct with chronic cholecystitis without obstruction: Secondary | ICD-10-CM | POA: Diagnosis not present

## 2023-03-20 DIAGNOSIS — K8010 Calculus of gallbladder with chronic cholecystitis without obstruction: Secondary | ICD-10-CM | POA: Diagnosis not present

## 2023-03-20 DIAGNOSIS — R7401 Elevation of levels of liver transaminase levels: Secondary | ICD-10-CM | POA: Diagnosis not present

## 2023-03-20 DIAGNOSIS — K8050 Calculus of bile duct without cholangitis or cholecystitis without obstruction: Secondary | ICD-10-CM | POA: Diagnosis not present

## 2023-03-20 HISTORY — PX: CHOLECYSTECTOMY: SHX55

## 2023-03-20 LAB — COMPREHENSIVE METABOLIC PANEL
ALT: 354 U/L — ABNORMAL HIGH (ref 0–44)
AST: 62 U/L — ABNORMAL HIGH (ref 15–41)
Albumin: 3 g/dL — ABNORMAL LOW (ref 3.5–5.0)
Alkaline Phosphatase: 120 U/L (ref 38–126)
Anion gap: 6 (ref 5–15)
BUN: 6 mg/dL (ref 6–20)
CO2: 21 mmol/L — ABNORMAL LOW (ref 22–32)
Calcium: 8.5 mg/dL — ABNORMAL LOW (ref 8.9–10.3)
Chloride: 107 mmol/L (ref 98–111)
Creatinine, Ser: 0.95 mg/dL (ref 0.44–1.00)
GFR, Estimated: >60 mL/min (ref >60)
Glucose, Bld: 94 mg/dL (ref 70–99)
Potassium: 3 mmol/L — ABNORMAL LOW (ref 3.5–5.1)
Sodium: 134 mmol/L — ABNORMAL LOW (ref 135–145)
Total Bilirubin: 1 mg/dL (ref <1.2)
Total Protein: 7 g/dL (ref 6.5–8.1)

## 2023-03-20 SURGERY — LAPAROSCOPIC CHOLECYSTECTOMY WITH INTRAOPERATIVE CHOLANGIOGRAM
Anesthesia: General

## 2023-03-20 MED ORDER — BUPIVACAINE-EPINEPHRINE 0.25% -1:200000 IJ SOLN
INTRAMUSCULAR | Status: DC | PRN
  Administered 2023-03-20: 30 mL

## 2023-03-20 MED ORDER — OXYCODONE HCL 5 MG/5ML PO SOLN: 5.0000 mg | Freq: Once | ORAL | Status: AC | PRN

## 2023-03-20 MED ORDER — FENTANYL CITRATE (PF) 100 MCG/2ML IJ SOLN
25.0000 ug | INTRAMUSCULAR | Status: DC | PRN
  Administered 2023-03-20: 50 ug via INTRAVENOUS

## 2023-03-20 MED ORDER — ROCURONIUM BROMIDE 10 MG/ML (PF) SYRINGE
PREFILLED_SYRINGE | INTRAVENOUS | Status: AC
  Filled 2023-03-20: qty 10

## 2023-03-20 MED ORDER — FENTANYL CITRATE (PF) 250 MCG/5ML IJ SOLN
INTRAMUSCULAR | Status: DC | PRN
  Administered 2023-03-20 (×2): 100 ug via INTRAVENOUS
  Administered 2023-03-20: 50 ug via INTRAVENOUS

## 2023-03-20 MED ORDER — LACTATED RINGERS IV SOLN: INTRAVENOUS | Status: DC

## 2023-03-20 MED ORDER — DEXAMETHASONE SODIUM PHOSPHATE 10 MG/ML IJ SOLN
INTRAMUSCULAR | Status: AC
  Filled 2023-03-20: qty 2

## 2023-03-20 MED ORDER — 0.9 % SODIUM CHLORIDE (POUR BTL) OPTIME
TOPICAL | Status: DC | PRN
  Administered 2023-03-20: 1000 mL

## 2023-03-20 MED ORDER — CHLORHEXIDINE GLUCONATE 0.12 % MT SOLN
15.0000 mL | Freq: Once | OROMUCOSAL | Status: AC
  Administered 2023-03-20: 15 mL via OROMUCOSAL

## 2023-03-20 MED ORDER — MIDAZOLAM HCL 2 MG/2ML IJ SOLN
INTRAMUSCULAR | Status: AC
Start: 2023-03-20 — End: ?
  Filled 2023-03-20: qty 2

## 2023-03-20 MED ORDER — SODIUM CHLORIDE 0.9 % IR SOLN
Status: DC | PRN
  Administered 2023-03-20: 1000 mL

## 2023-03-20 MED ORDER — NALOXONE HCL 0.4 MG/ML IJ SOLN
INTRAMUSCULAR | Status: AC
  Filled 2023-03-20: qty 1

## 2023-03-20 MED ORDER — ONDANSETRON HCL 4 MG/2ML IJ SOLN
INTRAMUSCULAR | Status: DC | PRN
  Administered 2023-03-20: 4 mg via INTRAVENOUS

## 2023-03-20 MED ORDER — SUCCINYLCHOLINE CHLORIDE 200 MG/10ML IV SOSY
PREFILLED_SYRINGE | INTRAVENOUS | Status: AC
  Filled 2023-03-20: qty 10

## 2023-03-20 MED ORDER — POTASSIUM CHLORIDE CRYS ER 20 MEQ PO TBCR
40.0000 meq | EXTENDED_RELEASE_TABLET | Freq: Four times a day (QID) | ORAL | Status: AC
  Administered 2023-03-20 (×2): 40 meq via ORAL
  Filled 2023-03-20 (×2): qty 2

## 2023-03-20 MED ORDER — ROCURONIUM BROMIDE 10 MG/ML (PF) SYRINGE
PREFILLED_SYRINGE | INTRAVENOUS | Status: DC | PRN
  Administered 2023-03-20: 50 mg via INTRAVENOUS

## 2023-03-20 MED ORDER — ONDANSETRON HCL 4 MG/2ML IJ SOLN
4.0000 mg | Freq: Three times a day (TID) | INTRAMUSCULAR | Status: DC | PRN
  Administered 2023-03-20 (×2): 4 mg via INTRAVENOUS
  Filled 2023-03-20 (×2): qty 2

## 2023-03-20 MED ORDER — ONDANSETRON HCL 4 MG/2ML IJ SOLN
INTRAMUSCULAR | Status: AC
  Filled 2023-03-20: qty 2

## 2023-03-20 MED ORDER — PROPOFOL 10 MG/ML IV BOLUS
INTRAVENOUS | Status: AC
  Filled 2023-03-20: qty 20

## 2023-03-20 MED ORDER — TOPIRAMATE 100 MG PO TABS
100.0000 mg | ORAL_TABLET | Freq: Two times a day (BID) | ORAL | Status: DC
  Administered 2023-03-20: 100 mg via ORAL
  Filled 2023-03-20: qty 1

## 2023-03-20 MED ORDER — ONDANSETRON HCL 4 MG/2ML IJ SOLN: 4.0000 mg | Freq: Four times a day (QID) | INTRAMUSCULAR | Status: DC | PRN

## 2023-03-20 MED ORDER — SUGAMMADEX SODIUM 200 MG/2ML IV SOLN
INTRAVENOUS | Status: DC | PRN
  Administered 2023-03-20: 200 mg via INTRAVENOUS

## 2023-03-20 MED ORDER — OXYCODONE HCL 5 MG PO TABS
5.0000 mg | ORAL_TABLET | Freq: Once | ORAL | Status: AC | PRN
  Administered 2023-03-20: 5 mg via ORAL

## 2023-03-20 MED ORDER — OXYCODONE HCL 5 MG PO TABS
ORAL_TABLET | ORAL | Status: AC
  Filled 2023-03-20: qty 1

## 2023-03-20 MED ORDER — POLYETHYLENE GLYCOL 3350 17 G PO PACK
17.0000 g | PACK | Freq: Every day | ORAL | Status: DC
  Administered 2023-03-21: 17 g via ORAL
  Filled 2023-03-20: qty 1

## 2023-03-20 MED ORDER — MORPHINE SULFATE (PF) 2 MG/ML IV SOLN
2.0000 mg | INTRAVENOUS | Status: DC | PRN
  Administered 2023-03-20: 2 mg via INTRAVENOUS
  Administered 2023-03-20: 4 mg via INTRAVENOUS
  Administered 2023-03-20 (×2): 2 mg via INTRAVENOUS
  Filled 2023-03-20: qty 2
  Filled 2023-03-20 (×3): qty 1

## 2023-03-20 MED ORDER — FENTANYL CITRATE (PF) 250 MCG/5ML IJ SOLN
INTRAMUSCULAR | Status: AC
  Filled 2023-03-20: qty 5

## 2023-03-20 MED ORDER — POTASSIUM CHLORIDE 10 MEQ/100ML IV SOLN: 10.0000 meq | INTRAVENOUS | Status: DC

## 2023-03-20 MED ORDER — LIDOCAINE 2% (20 MG/ML) 5 ML SYRINGE
INTRAMUSCULAR | Status: DC | PRN
  Administered 2023-03-20: 60 mg via INTRAVENOUS

## 2023-03-20 MED ORDER — SENNA 8.6 MG PO TABS
1.0000 | ORAL_TABLET | Freq: Every day | ORAL | Status: DC
  Administered 2023-03-20: 8.6 mg via ORAL
  Filled 2023-03-20: qty 1

## 2023-03-20 MED ORDER — CEFAZOLIN SODIUM-DEXTROSE 2-4 GM/100ML-% IV SOLN
INTRAVENOUS | Status: AC
  Filled 2023-03-20: qty 100

## 2023-03-20 MED ORDER — ORAL CARE MOUTH RINSE
15.0000 mL | Freq: Once | OROMUCOSAL | Status: AC
Start: 2023-03-20 — End: 2023-03-20

## 2023-03-20 MED ORDER — DEXAMETHASONE SODIUM PHOSPHATE 10 MG/ML IJ SOLN
INTRAMUSCULAR | Status: DC | PRN
  Administered 2023-03-20: 10 mg via INTRAVENOUS

## 2023-03-20 MED ORDER — PROPOFOL 10 MG/ML IV BOLUS
INTRAVENOUS | Status: DC | PRN
  Administered 2023-03-20: 200 mg via INTRAVENOUS

## 2023-03-20 MED ORDER — FENTANYL CITRATE (PF) 100 MCG/2ML IJ SOLN
INTRAMUSCULAR | Status: AC
  Filled 2023-03-20: qty 2

## 2023-03-20 MED ORDER — CHLORHEXIDINE GLUCONATE 0.12 % MT SOLN
OROMUCOSAL | Status: AC
  Filled 2023-03-20: qty 15

## 2023-03-20 MED ORDER — MIDAZOLAM HCL 2 MG/2ML IJ SOLN
INTRAMUSCULAR | Status: DC | PRN
  Administered 2023-03-20: 2 mg via INTRAVENOUS

## 2023-03-20 MED ORDER — BUPIVACAINE-EPINEPHRINE (PF) 0.25% -1:200000 IJ SOLN
INTRAMUSCULAR | Status: AC
  Filled 2023-03-20: qty 30

## 2023-03-20 SURGICAL SUPPLY — 42 items
APPLIER CLIP ROT 10 11.4 M/L (STAPLE) IMPLANT
BAG COUNTER SPONGE SURGICOUNT (BAG) ×2 IMPLANT
BLADE CLIPPER SURG (BLADE) IMPLANT
CANISTER SUCT 3000ML PPV (MISCELLANEOUS) ×2 IMPLANT
CATH CHOLANG 76X19 KUMAR (CATHETERS) IMPLANT
CHLORAPREP W/TINT 26 (MISCELLANEOUS) ×2 IMPLANT
CLIP APPLIE ROT 10 11.4 M/L (STAPLE) IMPLANT
CLIP LIGATING HEMO O LOK GREEN (MISCELLANEOUS) IMPLANT
COVER MAYO STAND STRL (DRAPES) ×2 IMPLANT
COVER SURGICAL LIGHT HANDLE (MISCELLANEOUS) ×2 IMPLANT
DERMABOND ADVANCED .7 DNX12 (GAUZE/BANDAGES/DRESSINGS) ×2 IMPLANT
DRAPE C-ARM 42X120 X-RAY (DRAPES) ×2 IMPLANT
ELECT REM PT RETURN 9FT ADLT (ELECTROSURGICAL) ×1 IMPLANT
ELECTRODE REM PT RTRN 9FT ADLT (ELECTROSURGICAL) ×2 IMPLANT
GLOVE BIOGEL PI IND STRL 7.0 (GLOVE) ×2 IMPLANT
GLOVE SURG SS PI 7.0 STRL IVOR (GLOVE) ×2 IMPLANT
GOWN STRL REUS W/ TWL LRG LVL3 (GOWN DISPOSABLE) ×6 IMPLANT
GRASPER SUT TROCAR 14GX15 (MISCELLANEOUS) ×2 IMPLANT
IRRIG SUCT STRYKERFLOW 2 WTIP (MISCELLANEOUS) ×1 IMPLANT
IRRIGATION SUCT STRKRFLW 2 WTP (MISCELLANEOUS) ×2 IMPLANT
KIT BASIN OR (CUSTOM PROCEDURE TRAY) ×2 IMPLANT
KIT IMAGING PINPOINTPAQ (MISCELLANEOUS) IMPLANT
KIT TURNOVER KIT B (KITS) ×2 IMPLANT
NDL 22X1.5 STRL (OR ONLY) (MISCELLANEOUS) ×2 IMPLANT
NEEDLE 22X1.5 STRL (OR ONLY) (MISCELLANEOUS) ×1 IMPLANT
NS IRRIG 1000ML POUR BTL (IV SOLUTION) ×2 IMPLANT
PAD ARMBOARD 7.5X6 YLW CONV (MISCELLANEOUS) ×2 IMPLANT
POUCH RETRIEVAL ECOSAC 10 (ENDOMECHANICALS) ×2 IMPLANT
SCISSORS LAP 5X35 DISP (ENDOMECHANICALS) ×2 IMPLANT
SET CHOLANGIOGRAPH 5 50 .035 (SET/KITS/TRAYS/PACK) ×2 IMPLANT
SET TUBE SMOKE EVAC HIGH FLOW (TUBING) ×2 IMPLANT
SLEEVE Z-THREAD 5X100MM (TROCAR) ×4 IMPLANT
SPECIMEN JAR SMALL (MISCELLANEOUS) ×2 IMPLANT
STOPCOCK 4 WAY LG BORE MALE ST (IV SETS) IMPLANT
SUT MNCRL AB 4-0 PS2 18 (SUTURE) ×2 IMPLANT
TOWEL GREEN STERILE (TOWEL DISPOSABLE) ×2 IMPLANT
TOWEL GREEN STERILE FF (TOWEL DISPOSABLE) ×2 IMPLANT
TRAY LAPAROSCOPIC MC (CUSTOM PROCEDURE TRAY) ×2 IMPLANT
TROCAR Z THREAD OPTICAL 12X100 (TROCAR) ×2 IMPLANT
TROCAR Z-THREAD OPTICAL 5X100M (TROCAR) ×2 IMPLANT
WARMER LAPAROSCOPE (MISCELLANEOUS) ×2 IMPLANT
WATER STERILE IRR 1000ML POUR (IV SOLUTION) ×2 IMPLANT

## 2023-03-20 NOTE — Addendum Note (Signed)
Addendum  created 03/20/23 1107 by Gloris Ham, CRNA   Attestation recorded in [place], Intraprocedure Attestations filed

## 2023-03-20 NOTE — Anesthesia Preprocedure Evaluation (Signed)
Anesthesia Evaluation  Patient identified by MRN, date of birth, ID band Patient awake    Reviewed: Allergy & Precautions, H&P , NPO status , Patient's Chart, lab work & pertinent test results  Airway Mallampati: II   Neck ROM: full    Dental   Pulmonary neg pulmonary ROS   breath sounds clear to auscultation       Cardiovascular hypertension,  Rhythm:regular Rate:Normal     Neuro/Psych  Headaches PSYCHIATRIC DISORDERS Anxiety Depression       GI/Hepatic   Endo/Other  Hypothyroidism  Class 3 obesity  Renal/GU      Musculoskeletal   Abdominal   Peds  Hematology   Anesthesia Other Findings   Reproductive/Obstetrics                             Anesthesia Physical Anesthesia Plan  ASA: 2  Anesthesia Plan: General   Post-op Pain Management:    Induction: Intravenous  PONV Risk Score and Plan: 3 and Ondansetron, Dexamethasone, Midazolam and Treatment may vary due to age or medical condition  Airway Management Planned: Oral ETT  Additional Equipment:   Intra-op Plan:   Post-operative Plan: Extubation in OR  Informed Consent: I have reviewed the patients History and Physical, chart, labs and discussed the procedure including the risks, benefits and alternatives for the proposed anesthesia with the patient or authorized representative who has indicated his/her understanding and acceptance.     Dental advisory given  Plan Discussed with: CRNA, Anesthesiologist and Surgeon  Anesthesia Plan Comments:        Anesthesia Quick Evaluation

## 2023-03-20 NOTE — Assessment & Plan Note (Deleted)
-   Improved, replenish electrolytes as appropriate

## 2023-03-20 NOTE — Progress Notes (Signed)
Pre Procedure note for inpatients:   ZEA NAIK has been scheduled for Procedure(s): LAPAROSCOPIC CHOLECYSTECTOMY WITH INTRAOPERATIVE CHOLANGIOGRAM (N/A) today. The various methods of treatment have been discussed with the patient. After consideration of the risks, benefits and treatment options the patient has consented to the planned procedure.   The patient has been seen and labs reviewed. There are no changes in the patient's condition to prevent proceeding with the planned procedure today.  Recent labs:  Lab Results  Component Value Date   WBC 5.5 03/19/2023   HGB 12.9 03/19/2023   HCT 39.2 03/19/2023   PLT 267 03/19/2023   GLUCOSE 94 03/20/2023   CHOL 203 (H) 06/25/2022   TRIG 141 06/25/2022   HDL 39 (L) 06/25/2022   LDLCALC 138 (H) 06/25/2022   ALT 354 (H) 03/20/2023   AST 62 (H) 03/20/2023   NA 134 (L) 03/20/2023   K 3.0 (L) 03/20/2023   CL 107 03/20/2023   CREATININE 0.95 03/20/2023   BUN 6 03/20/2023   CO2 21 (L) 03/20/2023   TSH 4.040 06/25/2022   INR 1.2 03/18/2023   HGBA1C 5.4 06/25/2022    Rodman Pickle, MD 03/20/2023 7:37 AM

## 2023-03-20 NOTE — Progress Notes (Signed)
     Daily Progress Note Intern Pager: [phone]  Patient name: Anna Anderson Medical record number: [id number] Date of birth: 1980/04/20 Age: 42 y.o. Gender: female  Primary Care Provider: Latrelle Dodrill, MD Consultants: General Surgery, GI  Code Status: Full   Pt Overview and Major Events to Date:  12/19 - Admitted  12/21 - Cholecystectomy   Assessment and Plan: Anna Anderson is a 42 y.o. female with PMH of HTN, pseudotumor cerebri found to have cholelithiasis and now s/p cholecystectomy.   Assessment & Plan Symptomatic cholelithiasis with likely choledocholithiasis causing elevated ALT/AST Now s/p cholecystectomy without complication. Patient did have many stones in gallbladder with evidence of chronic inflammation.  -GI following, awaiting results of autoimmune hepatitis workup -Surgery following, has advanced diet to carb modified  - Zofran for nausea  -AM CMP, CBC Pancreatic cyst Found on MRCP, no evidence of suspicious contrast or solid component. However radiology recommended EUS/FNA given age and internal complexity  - GI following, f/u recommendations    Chronic and Stable Issues: Chronic and Stable Problems:  HTN: Continue home amlodipine 10 mg, HCTZ 25 mg Subclinical hypothyroidism: Continue home levothyroxine 50 mcg Pseudotumor cerebri: restarted home topomax   FEN/GI: Carb modified  PPx: SCDs Dispo:Home pending clinical improvement   Subjective:  Patient says she is sleepy from after surgery and is having abdominal pain, but no other concerns.   Objective: Temp:  [97.1 F (36.2 C)-99 F (37.2 C)] 98.4 F (36.9 C) (12/21 0927) Pulse Rate:  [56-84] 67 (12/21 0927) Resp:  [14-23] 18 (12/21 0927) BP: (107-151)/(73-97) 107/73 (12/21 0927) SpO2:  [91 %-100 %] 99 % (12/21 0927) Weight:  [100.6 kg] 100.6 kg (12/21 0716) Physical Exam: General: Sleepy, but responsive, waking up from anesthesia  Cardiovascular: RRR, well perfused, cap refill < 2  seconds Respiratory: Normal work of breathing  Abdomen: Soft, slightly distended, tender in RUQ  Extremities: No BLE edema   Laboratory: Most recent CBC Lab Results  Component Value Date   WBC 5.5 03/19/2023   HGB 12.9 03/19/2023   HCT 39.2 03/19/2023   MCV 89.1 03/19/2023   PLT 267 03/19/2023   Most recent BMP    Latest Ref Rng & Units 03/20/2023    2:53 AM  BMP  Glucose 70 - 99 mg/dL 94   BUN 6 - 20 mg/dL 6   Creatinine 7.25 - 3.66 mg/dL 4.40   Sodium 347 - 425 mmol/L 134   Potassium 3.5 - 5.1 mmol/L 3.0   Chloride 98 - 111 mmol/L 107   CO2 22 - 32 mmol/L 21   Calcium 8.9 - 10.3 mg/dL 8.5   AST/ALT 95/638, Albumin 3.0   Lockie Mola, MD 03/20/2023, 4:24 PM  PGY-2, [facility] FPTS Intern pager: [phone], text pages welcome Secure chat group [facility] Teaching Service

## 2023-03-20 NOTE — Transfer of Care (Signed)
Immediate Anesthesia Transfer of Care Note  Patient: Anna Anderson  Procedure(s) Performed: LAPAROSCOPIC CHOLECYSTECTOMY WITH INTRAOPERATIVE CHOLANGIOGRAM  Patient Location: PACU  Anesthesia Type:General  Level of Consciousness: drowsy and patient cooperative  Airway & Oxygen Therapy: Patient Spontanous Breathing  Post-op Assessment: Report given to RN and Post -op Vital signs reviewed and stable  Post vital signs: Reviewed and stable  Last Vitals:  Vitals Value Taken Time  BP    Temp    Pulse 84 03/20/23 0838  Resp    SpO2 93 % 03/20/23 0838  Vitals shown include unfiled device data.  Last Pain:  Vitals:   03/20/23 0710  TempSrc: Oral  PainSc:          Complications: No notable events documented.

## 2023-03-20 NOTE — Assessment & Plan Note (Deleted)
Outpatient EUS

## 2023-03-20 NOTE — Anesthesia Procedure Notes (Signed)
Procedure Name: Intubation Date/Time: 03/20/2023 8:05 AM  Performed by: Gloris Ham, CRNAPre-anesthesia Checklist: Patient identified, Emergency Drugs available, Suction available and Patient being monitored Patient Re-evaluated:Patient Re-evaluated prior to induction Oxygen Delivery Method: Circle System Utilized Preoxygenation: Pre-oxygenation with 100% oxygen Induction Type: IV induction Ventilation: Mask ventilation without difficulty Laryngoscope Size: Miller and 3 Grade View: Grade II Tube type: Oral Tube size: 7.0 mm Number of attempts: 2 Airway Equipment and Method: Stylet and Oral airway Placement Confirmation: ETT inserted through vocal cords under direct vision, positive ETCO2 and breath sounds checked- equal and bilateral Secured at: 19 cm Tube secured with: Tape Dental Injury: Teeth and Oropharynx as per pre-operative assessment

## 2023-03-20 NOTE — Op Note (Signed)
PATIENT:  Anna Anderson  42 y.o. female  PRE-OPERATIVE DIAGNOSIS:  Biliary Colic,  POST-OPERATIVE DIAGNOSIS:  Biliary Colic,  PROCEDURE:  Procedure(s): LAPAROSCOPIC CHOLECYSTECTOMY    SURGEON:  Monzerrat Wellen, De Blanch, MD   ASSISTANT:   ANESTHESIA:   local and general  Indications for procedure: Anna Anderson is a 42 y.o. female with symptoms of Abdominal pain and Nausea and vomiting consistent with gallbladder disease, Confirmed by ultrasound and MRI.  Description of procedure: The patient was brought into the operative suite, placed supine. Anesthesia was administered with endotracheal tube. Patient was strapped in place and foot board was secured. All pressure points were offloaded by foam padding. The patient was prepped and draped in the usual sterile fashion.  A periumbilical incision was made and optical entry was used to enter the abdomen. 2 5 mm trocars were placed on in the right lateral space on in the right subcostal space. A 12mm trocar was placed in the subxiphoid space. Marcaine was infused to the subxiphoid space and lateral upper right abdomen in the transversus abdominis plane. Next the patient was placed in reverse trendelenberg. The gallbladder appearedfull of stones and chronically inflamed.   The gallbladder was retracted cephalad and lateral. The peritoneum was reflected off the infundibulum working lateral to medial. The cystic duct and cystic artery were identified and further dissection revealed a critical view.  The cystic duct and cystic artery were doubly clipped and ligated.   The gallbladder was removed off the liver bed with cautery. The Gallbladder was placed in a specimen bag. The gallbladder fossa was irrigated and hemostasis was applied with cautery. The gallbladder was removed via the 12mm trocar. The fascial defect was closed with interrupted 0 vicryl suture via laparoscopic trans-fascial suture passer and loose stones were removed. Pneumoperitoneum  was removed, all trocar were removed. All incisions were closed with 4-0 monocryl subcuticular stitch. The patient woke from anesthesia and was brought to PACU in stable condition. All counts were correct  Findings: chronic cholecystitis  Specimen: gallbladder  Blood loss: 20 ml  Local anesthesia: 30 ml Marcaine  Complications: none  PLAN OF CARE: Admit to inpatient   PATIENT DISPOSITION:  PACU - hemodynamically stable.  De Blanch [facility], [place]

## 2023-03-20 NOTE — Plan of Care (Signed)
CPI

## 2023-03-20 NOTE — Assessment & Plan Note (Deleted)
Patient evaluated post operatively.  - Monitor pain and nausea -  Thus far further workup from GI has revealed only confirmation of stones in the gallbladder, and pancreatic head cyst which they do not feel is contributory to her current presentation.  Labs have improved significantly this morning.  Autoimmune hepatitis workup pending per -Med-surg -GI following, awaiting results of autoimmune hepatitis workup -Surgery following, no new recommendations at this time -AM CMP, CBC

## 2023-03-20 NOTE — Plan of Care (Signed)

## 2023-03-20 NOTE — Assessment & Plan Note (Signed)
Now s/p cholecystectomy without complication. Patient did have many stones in gallbladder with evidence of chronic inflammation.  -GI following, awaiting results of autoimmune hepatitis workup -Surgery following, has advanced diet to carb modified  - Zofran for nausea  -AM CMP, CBC

## 2023-03-20 NOTE — Assessment & Plan Note (Signed)
Found on MRCP, no evidence of suspicious contrast or solid component. However radiology recommended EUS/FNA given age and internal complexity  - GI following, f/u recommendations

## 2023-03-20 NOTE — Anesthesia Postprocedure Evaluation (Signed)
Anesthesia Post Note  Patient: Anna Anderson  Procedure(s) Performed: LAPAROSCOPIC CHOLECYSTECTOMY WITH INTRAOPERATIVE CHOLANGIOGRAM     Patient location during evaluation: PACU Anesthesia Type: General Level of consciousness: awake and alert Pain management: pain level controlled Vital Signs Assessment: post-procedure vital signs reviewed and stable Respiratory status: spontaneous breathing, nonlabored ventilation, respiratory function stable and patient connected to nasal cannula oxygen Cardiovascular status: blood pressure returned to baseline and stable Postop Assessment: no apparent nausea or vomiting Anesthetic complications: no   No notable events documented.  Last Vitals:  Vitals:   03/20/23 0915 03/20/23 0927  BP: 118/80 107/73  Pulse: 68 67  Resp: (!) 23 18  Temp: (!) 36.2 C 36.9 C  SpO2: 97% 99%    Last Pain:  Vitals:   03/20/23 1000  TempSrc:   PainSc: 0-No pain                 Wynne Jury S

## 2023-03-21 ENCOUNTER — Encounter (HOSPITAL_COMMUNITY): Payer: Self-pay | Admitting: General Surgery

## 2023-03-21 DIAGNOSIS — R7401 Elevation of levels of liver transaminase levels: Secondary | ICD-10-CM | POA: Diagnosis not present

## 2023-03-21 LAB — CBC
HCT: 38.1 % (ref 36.0–46.0)
Hemoglobin: 12.3 g/dL (ref 12.0–15.0)
MCH: 29.1 pg (ref 26.0–34.0)
MCHC: 32.3 g/dL (ref 30.0–36.0)
MCV: 90.3 fL (ref 80.0–100.0)
Platelets: 280 10*3/uL (ref 150–400)
RBC: 4.22 MIL/uL (ref 3.87–5.11)
RDW: 14.5 % (ref 11.5–15.5)
WBC: 9.7 10*3/uL (ref 4.0–10.5)
nRBC: 0 % (ref 0.0–0.2)

## 2023-03-21 LAB — HEPATIC FUNCTION PANEL
ALT: 294 U/L — ABNORMAL HIGH (ref 0–44)
AST: 78 U/L — ABNORMAL HIGH (ref 15–41)
Albumin: 3.1 g/dL — ABNORMAL LOW (ref 3.5–5.0)
Alkaline Phosphatase: 99 U/L (ref 38–126)
Bilirubin, Direct: 0.3 mg/dL — ABNORMAL HIGH (ref 0.0–0.2)
Indirect Bilirubin: 0.7 mg/dL (ref 0.3–0.9)
Total Bilirubin: 1 mg/dL (ref <1.2)
Total Protein: 6.9 g/dL (ref 6.5–8.1)

## 2023-03-21 LAB — BASIC METABOLIC PANEL
Anion gap: 8 (ref 5–15)
BUN: 7 mg/dL (ref 6–20)
CO2: 21 mmol/L — ABNORMAL LOW (ref 22–32)
Calcium: 9 mg/dL (ref 8.9–10.3)
Chloride: 107 mmol/L (ref 98–111)
Creatinine, Ser: 1.13 mg/dL — ABNORMAL HIGH (ref 0.44–1.00)
GFR, Estimated: >60 mL/min (ref >60)
Glucose, Bld: 101 mg/dL — ABNORMAL HIGH (ref 70–99)
Potassium: 3.3 mmol/L — ABNORMAL LOW (ref 3.5–5.1)
Sodium: 136 mmol/L (ref 135–145)

## 2023-03-21 LAB — MITOCHONDRIAL ANTIBODIES: Mitochondrial M2 Ab, IgG: <20 U (ref 0.0–20.0)

## 2023-03-21 LAB — ANTI-SMOOTH MUSCLE ANTIBODY, IGG: F-Actin IgG: 10 U (ref 0–19)

## 2023-03-21 MED ORDER — ONDANSETRON 4 MG PO TBDP: 4.0000 mg | ORAL_TABLET | Freq: Three times a day (TID) | ORAL | 0 refills | Status: DC | PRN

## 2023-03-21 MED ORDER — OXYCODONE HCL 5 MG PO TABS: 5.0000 mg | ORAL_TABLET | ORAL | Status: DC | PRN

## 2023-03-21 MED ORDER — HEPATITIS B VAC RECOMB ADJ 20 MCG/0.5ML IM SOSY
0.5000 mL | PREFILLED_SYRINGE | Freq: Once | INTRAMUSCULAR | Status: AC
  Administered 2023-03-21: 0.5 mL via INTRAMUSCULAR
  Filled 2023-03-21: qty 0.5

## 2023-03-21 MED ORDER — POLYETHYLENE GLYCOL 3350 17 G PO PACK: 17.0000 g | PACK | Freq: Every day | ORAL | 0 refills | Status: DC

## 2023-03-21 MED ORDER — SENNA 8.6 MG PO TABS: 1.0000 | ORAL_TABLET | Freq: Every day | ORAL | 0 refills | Status: DC

## 2023-03-21 MED ORDER — OXYCODONE HCL 5 MG PO TABS: 5.0000 mg | ORAL_TABLET | Freq: Three times a day (TID) | ORAL | 0 refills | Status: DC | PRN

## 2023-03-21 MED ORDER — HEPATITIS A VACCINE 1440 EL U/ML IM SUSP
1.0000 mL | Freq: Once | INTRAMUSCULAR | Status: AC
Start: 2023-03-21 — End: 2023-03-21
  Administered 2023-03-21: 1440 [IU] via INTRAMUSCULAR
  Filled 2023-03-21: qty 1

## 2023-03-21 MED ORDER — POTASSIUM CHLORIDE CRYS ER 20 MEQ PO TBCR
40.0000 meq | EXTENDED_RELEASE_TABLET | Freq: Four times a day (QID) | ORAL | Status: DC
  Administered 2023-03-21: 40 meq via ORAL
  Filled 2023-03-21: qty 2

## 2023-03-21 NOTE — Progress Notes (Signed)
     Daily Progress Note Intern Pager: [phone]  Patient name: Anna Anderson Medical record number: [id number] Date of birth: [birth year] Age: 42 y.o. Gender: female  Primary Care Provider: Latrelle Dodrill, MD Consultants: General surgery, GI Code Status: Full  Pt Overview and Major Events to Date:  12/19 - Admitted  12/21 - Cholecystectomy   Assessment and Plan: Anna Anderson is a 42 y.o. female who presented with cholelithiasis and is now status post cholecystectomy. Pertinent PMH/PSH includes HTN, hypothyroidism, pseudotumor cerebri.  Assessment & Plan Symptomatic cholelithiasis with likely choledocholithiasis causing elevated ALT/AST S/p cholecystectomy without complication, POD #1.  Workup consistent with choledocholithiasis and biliary obstruction with gallstones.  Required morphine several times postsurgery.  Potentially home today with transition to oxy as needed for pain at home. -GI following, awaiting results of autoimmune hepatitis workup - Surgery following, has advanced diet to carb modified  - Zofran for nausea  - Recommend hepatitis A and B vaccine prior to discharge -MiraLAX daily Pancreatic cyst Found on MRCP, no evidence of suspicious contrast or solid component. However radiology recommended EUS/FNA given age and internal complexity. -Outpatient FNA with Dr. Meridee Score in 2 to 3 months  Chronic and Stable Issues: HTN: Continue home amlodipine 10 mg; HCTZ 25 mg held Subclinical hypothyroidism: Continue home levothyroxine 50 mcg Pseudotumor cerebri: Continue home Topamax 100 mg twice daily  FEN/GI: Carb modified PPx: SCDs Dispo:Home  today or tomorrow . Barriers include pain control.   Subjective:  Patient notes trace right upper quadrant tenderness.  She feels gas moving although has not passed gas or stool yet.  She had a small amount to eat yesterday but is nervous.  We discussed indications for returning home such as passing gas and pain  control.  Objective: Temp:  [97.1 F (36.2 C)-98.9 F (37.2 C)] 98.4 F (36.9 C) (12/22 0458) Pulse Rate:  [57-84] 57 (12/22 0458) Resp:  [16-23] 20 (12/22 0458) BP: (107-153)/(73-91) 153/87 (12/22 0458) SpO2:  [91 %-100 %] 99 % (12/22 0458) Weight:  [100.6 kg] 100.6 kg (12/21 0716) Physical Exam: General: well-appearing, NAD Cardiovascular: RRR, no murmurs auscultated Respiratory: CTAB, normal WOB Abdomen: Slightly tender in right upper quadrant, soft abdomen, slightly distended  Laboratory: Most recent CBC Lab Results  Component Value Date   WBC 9.7 03/21/2023   HGB 12.3 03/21/2023   HCT 38.1 03/21/2023   MCV 90.3 03/21/2023   PLT 280 03/21/2023   Most recent BMP    Latest Ref Rng & Units 03/21/2023    4:52 AM  BMP  Glucose 70 - 99 mg/dL 147   BUN 6 - 20 mg/dL 7   Creatinine 8.29 - 5.62 mg/dL 1.30   Sodium 865 - 784 mmol/L 136   Potassium 3.5 - 5.1 mmol/L 3.3   Chloride 98 - 111 mmol/L 107   CO2 22 - 32 mmol/L 21   Calcium 8.9 - 10.3 mg/dL 9.0   Other pertinent labs  AST/ALT: 62/354>78/294  Imaging/Diagnostic Tests: No recent imaging  Shelby Mattocks, DO 03/21/2023, 6:43 AM  PGY-3, [facility] FPTS Intern pager: [phone], text pages welcome Secure chat group [facility] Teaching Service

## 2023-03-21 NOTE — Assessment & Plan Note (Addendum)
S/p cholecystectomy without complication, POD #1.  Workup consistent with choledocholithiasis and biliary obstruction with gallstones.  Required morphine several times postsurgery.  Potentially home today with transition to oxy as needed for pain at home. -GI following, awaiting results of autoimmune hepatitis workup - Surgery following, has advanced diet to carb modified  - Zofran for nausea  - Recommend hepatitis A and B vaccine prior to discharge -MiraLAX daily

## 2023-03-21 NOTE — Discharge Instructions (Addendum)
Dear Anna Anderson,   Thank you for letting us participate in your care! In this section, you will find a brief hospital admission summary of why you were admitted to the hospital, what happened during your admission, your diagnosis/diagnoses, and recommended follow up.  Primary diagnosis: Choledocholithiasis with biliary obstruction Treatment plan: Gallbladder removal Secondary diagnosis: Pancreatic cyst Treatment plan: Recommend outpatient fine-needle aspiration with GI in the outpatient setting in 2 to 3 months  DOCTOR'S APPOINTMENTS & FOLLOW UP Future Appointments  Date Time Provider Department Center  03/26/2023  9:10 AM ACCESS TO CARE POOL [facility]     Thank you for choosing [facility] Take care and be well!  Family Medicine Teaching Service Inpatient Team [place]  [facility]  [address] [place], [place] [address] [phone]     [facility] ______[facility] LAPAROSCOPIC SURGERY: POST OP INSTRUCTIONS Always review your discharge instruction sheet given to you by the facility where your surgery was performed. IF YOU HAVE DISABILITY OR FAMILY LEAVE FORMS, YOU MUST BRING THEM TO THE OFFICE FOR PROCESSING.   DO NOT GIVE THEM TO YOUR DOCTOR.  A prescription for pain medication may be given to you upon discharge.  Take your pain medication as prescribed, if needed.  If narcotic pain medicine is not needed, then you may take acetaminophen (Tylenol) or ibuprofen (Advil) as needed. Take your usually prescribed medications unless otherwise directed. If you need a refill on your pain medication, please contact your pharmacy.  They will contact our office to request authorization. Prescriptions will not be filled after 5pm or on week-ends. You should follow a light diet the first few days after arrival home, such as soup and crackers, etc.  Be sure to include lots of fluids daily. Most patients will experience some swelling and  bruising in the area of the incisions.  Ice packs will help.  Swelling and bruising can take several days to resolve.  It is common to experience some constipation if taking pain medication after surgery.  Increasing fluid intake and taking a stool softener (such as Colace) will usually help or prevent this problem from occurring.  A mild laxative (Milk of Magnesia or Miralax) should be taken according to package instructions if there are no bowel movements after 48 hours. Unless discharge instructions indicate otherwise, you may remove your bandages 24-48 hours after surgery, and you may shower at that time.  You may have steri-strips (small skin tapes) in place directly over the incision.  These strips should be left on the skin for 7-10 days.  If your surgeon used skin glue on the incision, you may shower in 24 hours.  The glue will flake off over the next 2-3 weeks.  Any sutures or staples will be removed at the office during your follow-up visit. ACTIVITIES:  You may resume regular (light) daily activities beginning the next day--such as daily self-care, walking, climbing stairs--gradually increasing activities as tolerated.  You may have sexual intercourse when it is comfortable.  Refrain from any heavy lifting or straining until approved by your doctor. You may drive when you are no longer taking prescription pain medication, you can comfortably wear a seatbelt, and you can safely maneuver your car and apply brakes. RETURN TO WORK:  __________________________________________________________ Anna Anderson should see your doctor in the office for a follow-up appointment approximately 2-3 weeks after your surgery.  Make sure that you call for this appointment within a day or two after you  arrive home to insure a convenient appointment time. OTHER INSTRUCTIONS: __________________________________________________________________________________________________________________________  __________________________________________________________________________________________________________________________ WHEN TO CALL YOUR DOCTOR: Fever over 101.0 Inability to urinate Continued bleeding from incision. Increased pain, redness, or drainage from the incision. Increasing abdominal pain  The clinic staff is available to answer your questions during regular business hours.  Please don't hesitate to call and ask to speak to one of the nurses for clinical concerns.  If you have a medical emergency, go to the nearest emergency room or call 911.  A surgeon from [facility] is always on call at the hospital. [address], [address], [place], [place]  [address] ? [address] [address], [place], [place]   [address] [phone] ? [phone] ? FAX [phone] Web site: [email or web address]

## 2023-03-21 NOTE — Progress Notes (Signed)
1 Day Post-Op   Subjective/Chief Complaint: Feels better today.  Still sore though.   Objective: Vital signs in last 24 hours: Temp:  [98.4 F (36.9 C)-98.9 F (37.2 C)] 98.4 F (36.9 C) (12/22 0722) Pulse Rate:  [57-72] 72 (12/22 0724) Resp:  [18-20] 18 (12/22 0722) BP: (140-153)/(87-102) 147/97 (12/22 0724) SpO2:  [99 %-100 %] 99 % (12/22 0722) Last BM Date : 03/18/23  Intake/Output from previous day: 12/21 0701 - 12/22 0700 In: 400 [I.V.:400] Out: 5 [Blood:5] Intake/Output this shift: Total I/O In: 120 [P.O.:120] Out: -   Abdomen: Soft with mild diffuse tenderness.  Port sites clean dry intact  Lab Results:  Recent Labs    03/19/23 0610 03/21/23 0452  WBC 5.5 9.7  HGB 12.9 12.3  HCT 39.2 38.1  PLT 267 280   BMET Recent Labs    03/20/23 0253 03/21/23 0452  NA 134* 136  K 3.0* 3.3*  CL 107 107  CO2 21* 21*  GLUCOSE 94 101*  BUN 6 7  CREATININE 0.95 1.13*  CALCIUM 8.5* 9.0   PT/INR Recent Labs    03/18/23 1634  LABPROT 15.9*  INR 1.2   ABG No results for input(s): "PHART", "HCO3" in the last 72 hours.  Invalid input(s): "PCO2", "PO2"  Studies/Results: No results found.  Anti-infectives: Anti-infectives (From admission, onward)    Start     Dose/Rate Route Frequency Ordered Stop   03/20/23 0741  ceFAZolin (ANCEF) 2-4 GM/100ML-% IVPB       Note to Pharmacy: Aquilla Hacker M: cabinet override      03/20/23 0741 03/20/23 1944   03/20/23 0600  cefTRIAXone (ROCEPHIN) 2 g in sodium chloride 0.9 % 100 mL IVPB  Status:  Discontinued        2 g 200 mL/hr over 30 Minutes Intravenous  Once 03/19/23 1519 03/19/23 1624   03/19/23 1715  ciprofloxacin (CIPRO) IVPB 400 mg  Status:  Discontinued        400 mg 200 mL/hr over 60 Minutes Intravenous Every 12 hours 03/19/23 1624 03/20/23 0934   03/19/23 1715  metroNIDAZOLE (FLAGYL) IVPB 500 mg  Status:  Discontinued        500 mg 100 mL/hr over 60 Minutes Intravenous Every 12 hours 03/19/23 1624 03/20/23  0934       Assessment/Plan: s/p Procedure(s): LAPAROSCOPIC CHOLECYSTECTOMY WITH INTRAOPERATIVE CHOLANGIOGRAM (N/Anna) Stable for discharge  LFTs improving  Will arrange for surgical follow-up  Instructions on chart  LOS: 3 days    Anna Anderson Anna Anderson 03/21/2023

## 2023-03-21 NOTE — Assessment & Plan Note (Signed)
Found on MRCP, no evidence of suspicious contrast or solid component. However radiology recommended EUS/FNA given age and internal complexity. -Outpatient FNA with Dr. Meridee Score in 2 to 3 months

## 2023-03-21 NOTE — Discharge Summary (Addendum)
Family Medicine Teaching [facility] Discharge Summary  Patient name: Anna Anderson Medical record number: [id number] Date of birth: 22-Apr-1980 Age: 42 y.o. Gender: female Date of Admission: 03/18/2023  Date of Discharge: 03/21/23  Admitting Physician: Gerrit Heck, DO  Primary Care Provider: Latrelle Dodrill, MD Consultants: GI, general surgery  Indication for Hospitalization: Nausea/vomiting with transaminitis  Discharge Diagnoses/Problem List:  Principal Problem for Admission: Choledocholithiasis with biliary obstruction Other Problems addressed during stay:  Principal Problem:   Symptomatic cholelithiasis with likely choledocholithiasis causing elevated ALT/AST Active Problems:   Pancreatic cyst   Transaminitis  Brief Hospital Course:  Anna Anderson is a 42 y.o. female who was admitted to the [facility] Teaching Service at [facility] for transaminitis and epigastric/RUQ pain. Hospital course is outlined below by problem.   Transaminitis  RUQ pain Significantly elevated AST/ALT and right upper quadrant pain and found to have possible gallstones on RUQ [place].  GI was consulted and recommended MRCP that confirmed cholelithiasis without ductal dilation.  Autoimmune hepatitis workup negative.  General surgery performed cholecystectomy on 12/21 without complication. LFTs continued to improve. Patient was stable at discharged.  AKI Likely prerenal secondary to decreased PO intake.  Given fluid resuscitation with improvement. Resolved on 12/21 Held hydrochlorothiazide in setting of hypokalemia.   Issues for follow up: Recommend GI follow up for endoscopic ultrasound evaluation +/- FNA of pancreatic cyst with internal complexity as seen on MRCP.  Did not restart hydrochlorothiazide at discharge. Consider restarting at PCP follow up  Gave Hep A and Hep B vaccine during admission. Will need second dose of Hep B vaccine  Disposition: Home  Discharge Condition:  Stable  Discharge Exam:  Vitals:   03/21/23 0722 03/21/23 0724  BP: (!) 143/102 (!) 147/97  Pulse: 72 72  Resp: 18   Temp: 98.4 F (36.9 C)   SpO2: 99%    Per Dr. Delaney Meigs exam on 12/22:  Physical Exam: General: well-appearing, NAD Cardiovascular: RRR, no murmurs auscultated Respiratory: CTAB, normal WOB Abdomen: Slightly tender in right upper quadrant, soft abdomen, slightly distended   Significant Procedures: Cholecystectomy  Significant Labs and Imaging:  Recent Labs  Lab 03/21/23 0452  WBC 9.7  HGB 12.3  HCT 38.1  PLT 280   Recent Labs  Lab 03/20/23 0253 03/21/23 0452  NA 134* 136  K 3.0* 3.3*  CL 107 107  CO2 21* 21*  GLUCOSE 94 101*  BUN 6 7  CREATININE 0.95 1.13*  CALCIUM 8.5* 9.0  ALKPHOS 120 99  AST 62* 78*  ALT 354* 294*  ALBUMIN 3.0* 3.1*    Pertinent Imaging MRCP:  IMPRESSION: 1. Cholelithiasis without evidence of acute cholecystitis. No biliary ductal dilatation. 2. Thinly septated fluid signal cystic lesion within the central pancreatic head anterior to the common bile duct, probably communicating with the adjacent main pancreatic duct, measuring 1.3 x 1.2 cm. No solid component or suspicious contrast enhancement. No pancreatic ductal dilatation or surrounding inflammatory changes. 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 3. Hepatomegaly and hepatic steatosis. 4. Descending colonic diverticulosis. 5. Cardiomegaly. Results/Tests Pending at Time of Discharge: Anti-smooth muscle antibody and mitochondrial antibodies  Discharge Medications:  Allergies as of 03/21/2023       Reactions   Amoxicillin Swelling   Lisinopril Cough        Medication List     STOP taking these medications    hydrochlorothiazide 25 MG tablet Commonly known  as: HYDRODIURIL   potassium chloride SA 20 MEQ tablet Commonly known as: KLOR-CON M        TAKE these medications    amLODipine 10 MG tablet Commonly known as: NORVASC TAKE 1 TABLET(10 MG) BY MOUTH DAILY. Need MD appt before further refills.   levothyroxine 50 MCG tablet Commonly known as: SYNTHROID TAKE 1 TABLET BY MOUTH EVERY DAY   ondansetron 4 MG disintegrating tablet Commonly known as: ZOFRAN-ODT Take 1 tablet (4 mg total) by mouth every 8 (eight) hours as needed for nausea or vomiting.   oxyCODONE 5 MG immediate release tablet Commonly known as: Roxicodone Take 1 tablet (5 mg total) by mouth every 8 (eight) hours as needed for up to 5 days.   polyethylene glycol 17 g packet Commonly known as: MIRALAX / GLYCOLAX Take 17 g by mouth daily.   senna 8.6 MG Tabs tablet Commonly known as: SENOKOT Take 1 tablet (8.6 mg total) by mouth at bedtime.   topiramate 100 MG tablet Commonly known as: TOPAMAX Take 1 tablet (100 mg total) by mouth 2 (two) times daily. What changed:  how much to take when to take this   triamcinolone ointment 0.5 % Commonly known as: KENALOG APPLY TOPICALLY TO THE AFFECTED AREA TWICE DAILY FOR MODERATE TO SEVERE ECZEMA. DO NOT USE FOR MORE THAN 1 WEEK AT A TIME        Discharge Instructions: Please refer to Patient Instructions section of EMR for full details.  Patient was counseled important signs and symptoms that should prompt return to medical care, changes in medications, dietary instructions, activity restrictions, and follow up appointments.   Follow-Up Appointments:  Follow-up Information     Mansouraty, Netty Starring., MD. Call in 1 week(s).   Specialties: Gastroenterology, Internal Medicine Contact information: [address] [place] [address] [phone]         [facility], [place]. Schedule an appointment as soon as possible for a visit in 2 week(s).   Specialty: General Surgery Contact information: [address] [place] [address] [phone]                Penne Lash, MD 03/21/2023, 12:37 PM PGY-3, [facility]     I have evaluated this patient along with Dr. Georg Ruddle and reviewed the above note, making necessary revisions.  Dorothyann Gibbs, MD 03/21/2023, 1:56 PM PGY-3, [facility]

## 2023-03-21 NOTE — Plan of Care (Signed)

## 2023-03-23 LAB — SURGICAL PATHOLOGY

## 2023-03-26 ENCOUNTER — Encounter: Payer: Self-pay | Admitting: Student

## 2023-03-26 ENCOUNTER — Ambulatory Visit: Payer: Medicaid Other | Admitting: Student

## 2023-03-26 VITALS — BP 120/75 | HR 79 | Ht 61.0 in | Wt 221.8 lb

## 2023-03-26 DIAGNOSIS — K862 Cyst of pancreas: Secondary | ICD-10-CM | POA: Diagnosis not present

## 2023-03-26 DIAGNOSIS — I10 Essential (primary) hypertension: Secondary | ICD-10-CM

## 2023-03-26 DIAGNOSIS — K8020 Calculus of gallbladder without cholecystitis without obstruction: Secondary | ICD-10-CM

## 2023-03-26 MED ORDER — HYDROCHLOROTHIAZIDE 25 MG PO TABS: 25.0000 mg | ORAL_TABLET | Freq: Every day | ORAL | Status: DC

## 2023-03-26 NOTE — Assessment & Plan Note (Signed)
BP: 120/75 today. Well controlled. Goal of <130/80.  She has already restarted her HTN medications.  Continue to work on healthy dietary habits and exercise. Medication regimen: Amlodipine 10 mg daily, HCTZ 25 mg daily.

## 2023-03-26 NOTE — Assessment & Plan Note (Addendum)
Resolved.  Well-healing surgical incisions.  Obtain BMP, CBC.

## 2023-03-26 NOTE — Assessment & Plan Note (Signed)
Referral to GI for follow-up for endoscopic ultrasound evaluation +/- FNA of pancreatic cyst as seen on MRCP.

## 2023-03-26 NOTE — Progress Notes (Cosign Needed Addendum)
  SUBJECTIVE:   CHIEF COMPLAINT / HPI:   Presents today for hospital follow-up of transaminitis and epigastric/right upper quadrant pain in which MRCP confirmed cholelithiasis without ductal dilation.  She had cholecystectomy performed by general surgery.  She does not yet have GI follow up and needs referral for this.  PERTINENT  PMH / PSH: HTN, GAD, subclinical hypothyroidism  OBJECTIVE:  BP 120/75   Pulse 79   Ht 5\' 1"  (1.549 m)   Wt 221 lb 12.8 oz (100.6 kg)   LMP 02/15/2023 (Approximate)   SpO2 99%   BMI 41.91 kg/m  General: Well-appearing, NAD CV: RRR, no murmurs auscultated Pulm: CTAB, normal WOB Abdomen: Soft, nontender, normoactive bowel sounds, well-healing surgical incisions without signs of infection or drainage  ASSESSMENT/PLAN:   Assessment & Plan Symptomatic cholelithiasis with likely choledocholithiasis causing elevated ALT/AST Resolved.  Well-healing surgical incisions.  Obtain BMP, CBC. Pancreatic cyst Referral to GI for follow-up for endoscopic ultrasound evaluation +/- FNA of pancreatic cyst as seen on MRCP. Primary hypertension BP: 120/75 today. Well controlled. Goal of <130/80.  She has already restarted her HTN medications.  Continue to work on healthy dietary habits and exercise. Medication regimen: Amlodipine 10 mg daily, HCTZ 25 mg daily. Return if symptoms worsen or fail to improve. Shelby Mattocks, DO 03/26/2023, 7:34 PM PGY-3, [facility]

## 2023-03-26 NOTE — Patient Instructions (Addendum)
It was great to see you today! Thank you for choosing [facility] for your primary care.  Today we addressed: Please restart all of your medications including your hydrochlorothiazide.  You are cleared to return on January 6 without precautions.  In the meantime, please do not lift in excess of 15 pounds.  You are safe to return to bathe normally. 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 We are obtaining posthospitalization blood work today.  If you haven't already, sign up for My Chart to have easy access to your labs results, and communication with your primary care physician.  Return if symptoms worsen or fail to improve. Please arrive 15 minutes before your appointment to ensure smooth check in process.  We appreciate your efforts in making this happen.  Thank you for allowing me to participate in your care, Shelby Mattocks, DO 03/26/2023, 9:52 AM PGY-3, [facility]

## 2023-03-27 LAB — BASIC METABOLIC PANEL
BUN/Creatinine Ratio: 15 (ref 9–23)
BUN: 14 mg/dL (ref 6–24)
CO2: 19 mmol/L — ABNORMAL LOW (ref 20–29)
Calcium: 9.5 mg/dL (ref 8.7–10.2)
Chloride: 105 mmol/L (ref 96–106)
Creatinine, Ser: 0.91 mg/dL (ref 0.57–1.00)
Glucose: 89 mg/dL (ref 70–99)
Potassium: 3.7 mmol/L (ref 3.5–5.2)
Sodium: 140 mmol/L (ref 134–144)
eGFR: 81 mL/min/{1.73_m2} (ref >59)

## 2023-03-27 LAB — CBC
Hematocrit: 40.9 % (ref 34.0–46.6)
Hemoglobin: 13 g/dL (ref 11.1–15.9)
MCH: 28.8 pg (ref 26.6–33.0)
MCHC: 31.8 g/dL (ref 31.5–35.7)
MCV: 91 fL (ref 79–97)
Platelets: 340 10*3/uL (ref 150–450)
RBC: 4.51 x10E6/uL (ref 3.77–5.28)
RDW: 13.9 % (ref 11.7–15.4)
WBC: 6.4 10*3/uL (ref 3.4–10.8)

## 2023-03-30 ENCOUNTER — Encounter: Payer: Self-pay | Admitting: Student

## 2023-03-30 ENCOUNTER — Encounter: Payer: Self-pay | Admitting: Gastroenterology

## 2023-04-01 ENCOUNTER — Telehealth: Payer: Self-pay

## 2023-04-01 NOTE — Telephone Encounter (Signed)
 Pt has office visit in March to discuss

## 2023-04-01 NOTE — Telephone Encounter (Signed)
-----   Message from 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  Avier Jech, Please arrange Upper EUS in 2-3 months. Thanks. 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

## 2023-04-06 ENCOUNTER — Ambulatory Visit: Payer: Medicaid Other | Admitting: Neurology

## 2023-04-17 ENCOUNTER — Other Ambulatory Visit: Payer: Self-pay | Admitting: Family Medicine

## 2023-04-17 DIAGNOSIS — I10 Essential (primary) hypertension: Secondary | ICD-10-CM

## 2023-06-07 DIAGNOSIS — Z202 Contact with and (suspected) exposure to infections with a predominantly sexual mode of transmission: Secondary | ICD-10-CM | POA: Diagnosis not present

## 2023-06-07 DIAGNOSIS — Z113 Encounter for screening for infections with a predominantly sexual mode of transmission: Secondary | ICD-10-CM | POA: Diagnosis not present

## 2023-06-07 DIAGNOSIS — N852 Hypertrophy of uterus: Secondary | ICD-10-CM | POA: Diagnosis not present

## 2023-06-07 DIAGNOSIS — E282 Polycystic ovarian syndrome: Secondary | ICD-10-CM | POA: Diagnosis not present

## 2023-06-07 DIAGNOSIS — Z01411 Encounter for gynecological examination (general) (routine) with abnormal findings: Secondary | ICD-10-CM | POA: Diagnosis not present

## 2023-06-07 DIAGNOSIS — Z1151 Encounter for screening for human papillomavirus (HPV): Secondary | ICD-10-CM | POA: Diagnosis not present

## 2023-06-07 DIAGNOSIS — Z124 Encounter for screening for malignant neoplasm of cervix: Secondary | ICD-10-CM | POA: Diagnosis not present

## 2023-06-07 DIAGNOSIS — Z1389 Encounter for screening for other disorder: Secondary | ICD-10-CM | POA: Diagnosis not present

## 2023-06-07 DIAGNOSIS — Z1231 Encounter for screening mammogram for malignant neoplasm of breast: Secondary | ICD-10-CM | POA: Diagnosis not present

## 2023-06-07 DIAGNOSIS — B372 Candidiasis of skin and nail: Secondary | ICD-10-CM | POA: Diagnosis not present

## 2023-06-07 LAB — HM PAP SMEAR: HM Pap smear: NEGATIVE

## 2023-06-07 LAB — RESULTS CONSOLE HPV: CHL HPV: NEGATIVE

## 2023-06-09 LAB — HM MAMMOGRAPHY: HM Mammogram: NORMAL (ref 0–4)

## 2023-06-12 ENCOUNTER — Encounter (HOSPITAL_COMMUNITY): Payer: Self-pay

## 2023-06-12 ENCOUNTER — Emergency Department (HOSPITAL_COMMUNITY)
Admission: EM | Admit: 2023-06-12 | Discharge: 2023-06-13 | Disposition: A | Discharge status: Home / Self Care | Attending: Emergency Medicine | Admitting: Emergency Medicine

## 2023-06-12 ENCOUNTER — Emergency Department (HOSPITAL_COMMUNITY)

## 2023-06-12 DIAGNOSIS — R072 Precordial pain: Secondary | ICD-10-CM | POA: Insufficient documentation

## 2023-06-12 DIAGNOSIS — I10 Essential (primary) hypertension: Secondary | ICD-10-CM | POA: Diagnosis not present

## 2023-06-12 DIAGNOSIS — Z79899 Other long term (current) drug therapy: Secondary | ICD-10-CM | POA: Diagnosis not present

## 2023-06-12 DIAGNOSIS — R55 Syncope and collapse: Secondary | ICD-10-CM | POA: Diagnosis not present

## 2023-06-12 DIAGNOSIS — R079 Chest pain, unspecified: Secondary | ICD-10-CM | POA: Diagnosis not present

## 2023-06-12 DIAGNOSIS — I700 Atherosclerosis of aorta: Secondary | ICD-10-CM | POA: Diagnosis not present

## 2023-06-12 LAB — BASIC METABOLIC PANEL
Anion gap: 10 (ref 5–15)
BUN: 13 mg/dL (ref 6–20)
CO2: 22 mmol/L (ref 22–32)
Calcium: 9.6 mg/dL (ref 8.9–10.3)
Chloride: 107 mmol/L (ref 98–111)
Creatinine, Ser: 1.08 mg/dL — ABNORMAL HIGH (ref 0.44–1.00)
GFR, Estimated: >60 mL/min (ref >60)
Glucose, Bld: 103 mg/dL — ABNORMAL HIGH (ref 70–99)
Potassium: 3.8 mmol/L (ref 3.5–5.1)
Sodium: 139 mmol/L (ref 135–145)

## 2023-06-12 LAB — CBC
HCT: 40.4 % (ref 36.0–46.0)
Hemoglobin: 13.1 g/dL (ref 12.0–15.0)
MCH: 29.2 pg (ref 26.0–34.0)
MCHC: 32.4 g/dL (ref 30.0–36.0)
MCV: 90 fL (ref 80.0–100.0)
Platelets: 291 10*3/uL (ref 150–400)
RBC: 4.49 MIL/uL (ref 3.87–5.11)
RDW: 13.9 % (ref 11.5–15.5)
WBC: 9.3 10*3/uL (ref 4.0–10.5)
nRBC: 0 % (ref 0.0–0.2)

## 2023-06-12 LAB — HCG, SERUM, QUALITATIVE: Preg, Serum: NEGATIVE

## 2023-06-12 LAB — TROPONIN I (HIGH SENSITIVITY): Troponin I (High Sensitivity): <2 ng/L (ref <18)

## 2023-06-12 NOTE — ED Triage Notes (Signed)
 Pt states that she has been having CP, central that radiates to her L arm. Pain has been going on all day, some nausea

## 2023-06-13 ENCOUNTER — Emergency Department (HOSPITAL_COMMUNITY)

## 2023-06-13 DIAGNOSIS — I700 Atherosclerosis of aorta: Secondary | ICD-10-CM | POA: Diagnosis not present

## 2023-06-13 DIAGNOSIS — R55 Syncope and collapse: Secondary | ICD-10-CM | POA: Diagnosis not present

## 2023-06-13 LAB — TROPONIN I (HIGH SENSITIVITY): Troponin I (High Sensitivity): <2 ng/L (ref <18)

## 2023-06-13 MED ORDER — ALUM & MAG HYDROXIDE-SIMETH 200-200-20 MG/5ML PO SUSP
30.0000 mL | Freq: Once | ORAL | Status: AC
  Administered 2023-06-13: 30 mL via ORAL
  Filled 2023-06-13: qty 30

## 2023-06-13 MED ORDER — OMEPRAZOLE 20 MG PO CPDR: 20.0000 mg | DELAYED_RELEASE_CAPSULE | Freq: Every day | ORAL | 0 refills | Status: DC

## 2023-06-13 MED ORDER — IOHEXOL 350 MG/ML SOLN
75.0000 mL | Freq: Once | INTRAVENOUS | Status: AC | PRN
  Administered 2023-06-13: 75 mL via INTRAVENOUS

## 2023-06-13 MED ORDER — KETOROLAC TROMETHAMINE 30 MG/ML IJ SOLN
15.0000 mg | Freq: Once | INTRAMUSCULAR | Status: AC
  Administered 2023-06-13: 15 mg via INTRAVENOUS
  Filled 2023-06-13: qty 1

## 2023-06-13 MED ORDER — LIDOCAINE 5 % EX PTCH: 1.0000 | MEDICATED_PATCH | CUTANEOUS | 0 refills | Status: DC

## 2023-06-13 NOTE — ED Provider Notes (Signed)
 [place] [facility] AT [facility] Provider Note   CSN: [id number] Arrival date & time: 06/12/23  2018     History  Chief Complaint  Patient presents with   Chest Pain    Anna Anderson is a 43 y.o. female.  The history is provided by the patient.  Chest Pain Pain location:  Substernal area Pain quality: sharp   Pain radiates to:  L arm Pain severity:  Moderate Onset quality:  Gradual Duration:  1 day Progression:  Unchanged Chronicity:  New Context: not breathing   Relieved by:  Nothing Worsened by:  Nothing Ineffective treatments:  None tried Associated symptoms: no anorexia, no anxiety, no back pain, no diaphoresis, no dizziness, no dysphagia, no fatigue, no fever, no headache, no heartburn, no lower extremity edema, no nausea, no near-syncope, no numbness, no orthopnea, no palpitations, no PND, no shortness of breath, no syncope, no vomiting and no weakness   Risk factors: no aortic disease   Patient with a h/o hypertension presents with 1 day of chest pain.  No n/v/d.  No travel.  Did have surgery in December.      Past Medical History:  Diagnosis Date   Abdominal pain 01/01/2020   AKI (acute kidney injury) (HCC) 03/18/2023   Concussion    Constipation 10/25/2017   Hypertension    Left lower quadrant abdominal pain 10/20/2018   Post concussive syndrome 05/18/2016   Right facial swelling 08/12/2017   Trichomonas infection 03/26/2021   Vitamin D deficiency 07/24/2014     Home Medications Prior to Admission medications   Medication Sig Start Date End Date Taking? Authorizing Provider  lidocaine (LIDODERM) 5 % Place 1 patch onto the skin daily. Remove & Discard patch within 12 hours or as directed by MD 06/13/23  Yes Angelle Isais, MD  omeprazole (PRILOSEC) 20 MG capsule Take 1 capsule (20 mg total) by mouth daily. 06/13/23  Yes Clive Parcel, MD  amLODipine (NORVASC) 10 MG tablet TAKE 1 TABLET(10 MG) BY MOUTH DAILY. Need MD appt before  further refills. 02/05/23   Billey Co, MD  hydrochlorothiazide (HYDRODIURIL) 25 MG tablet TAKE 1 TABLET(25 MG) BY MOUTH DAILY 04/19/23   Latrelle Dodrill, MD  levothyroxine (SYNTHROID) 50 MCG tablet TAKE 1 TABLET BY MOUTH EVERY DAY 03/03/23   Latrelle Dodrill, MD  topiramate (TOPAMAX) 100 MG tablet Take 1 tablet (100 mg total) by mouth 2 (two) times daily. Patient taking differently: Take 200 mg by mouth at bedtime. 11/04/22   Levert Feinstein, MD  triamcinolone ointment (KENALOG) 0.5 % APPLY TOPICALLY TO THE AFFECTED AREA TWICE DAILY FOR MODERATE TO SEVERE ECZEMA. DO NOT USE FOR MORE THAN 1 WEEK AT A TIME Patient taking differently: Apply 1 Application topically daily as needed (eczema). 07/03/22   Latrelle Dodrill, MD      Allergies    Amoxicillin and Lisinopril    Review of Systems   Review of Systems  Constitutional:  Negative for diaphoresis, fatigue and fever.  HENT:  Negative for trouble swallowing.   Respiratory:  Negative for shortness of breath.   Cardiovascular:  Positive for chest pain. Negative for palpitations, orthopnea, syncope, PND and near-syncope.  Gastrointestinal:  Negative for anorexia, heartburn, nausea and vomiting.  Musculoskeletal:  Negative for back pain.  Neurological:  Negative for dizziness, weakness, numbness and headaches.    Physical Exam Updated Vital Signs BP 137/86   Pulse 64   Temp 97.9 F (36.6 C) (Oral)   Resp 14  SpO2 100%  Physical Exam Vitals and nursing note reviewed.  Constitutional:      General: She is not in acute distress.    Appearance: Normal appearance. She is well-developed.  HENT:     Head: Normocephalic and atraumatic.     Nose: Nose normal.  Eyes:     Pupils: Pupils are equal, round, and reactive to light.  Cardiovascular:     Rate and Rhythm: Normal rate and regular rhythm.     Pulses: Normal pulses.     Heart sounds: Normal heart sounds.  Pulmonary:     Effort: Pulmonary effort is normal. No respiratory  distress.     Breath sounds: Normal breath sounds.  Abdominal:     General: Bowel sounds are normal. There is no distension.     Palpations: Abdomen is soft.     Tenderness: There is no abdominal tenderness. There is no guarding or rebound.  Musculoskeletal:        General: Normal range of motion.     Cervical back: Neck supple.  Skin:    General: Skin is warm and dry.     Capillary Refill: Capillary refill takes less than 2 seconds.     Findings: No erythema or rash.  Neurological:     General: No focal deficit present.     Mental Status: She is alert.     Deep Tendon Reflexes: Reflexes normal.  Psychiatric:        Mood and Affect: Mood normal.     ED Results / Procedures / Treatments   Labs (all labs ordered are listed, but only abnormal results are displayed) Results for orders placed or performed during the hospital encounter of 06/12/23  Basic metabolic panel   Collection Time: 06/12/23  9:02 PM  Result Value Ref Range   Sodium 139 135 - 145 mmol/L   Potassium 3.8 3.5 - 5.1 mmol/L   Chloride 107 98 - 111 mmol/L   CO2 22 22 - 32 mmol/L   Glucose, Bld 103 (H) 70 - 99 mg/dL   BUN 13 6 - 20 mg/dL   Creatinine, Ser 2.95 (H) 0.44 - 1.00 mg/dL   Calcium 9.6 8.9 - 62.1 mg/dL   GFR, Estimated >30 >86 mL/min   Anion gap 10 5 - 15  CBC   Collection Time: 06/12/23  9:02 PM  Result Value Ref Range   WBC 9.3 4.0 - 10.5 K/uL   RBC 4.49 3.87 - 5.11 MIL/uL   Hemoglobin 13.1 12.0 - 15.0 g/dL   HCT 57.8 46.9 - 62.9 %   MCV 90.0 80.0 - 100.0 fL   MCH 29.2 26.0 - 34.0 pg   MCHC 32.4 30.0 - 36.0 g/dL   RDW 52.8 41.3 - 24.4 %   Platelets 291 150 - 400 K/uL   nRBC 0.0 0.0 - 0.2 %  hCG, serum, qualitative   Collection Time: 06/12/23  9:02 PM  Result Value Ref Range   Preg, Serum NEGATIVE NEGATIVE  Troponin I (High Sensitivity)   Collection Time: 06/12/23  9:02 PM  Result Value Ref Range   Troponin I (High Sensitivity) <2 <18 ng/L  Troponin I (High Sensitivity)   Collection  Time: 06/12/23 11:28 PM  Result Value Ref Range   Troponin I (High Sensitivity) <2 <18 ng/L   CT Angio Chest PE W and/or Wo Contrast Result Date: 06/13/2023 CLINICAL DATA:  Syncope. EXAM: CT ANGIOGRAPHY CHEST WITH CONTRAST TECHNIQUE: Multidetector CT imaging of the chest was performed using the standard protocol  during bolus administration of intravenous contrast. Multiplanar CT image reconstructions and MIPs were obtained to evaluate the vascular anatomy. RADIATION DOSE REDUCTION: This exam was performed according to the departmental dose-optimization program which includes automated exposure control, adjustment of the mA and/or kV according to patient size and/or use of iterative reconstruction technique. CONTRAST:  75mL OMNIPAQUE IOHEXOL 350 MG/ML SOLN COMPARISON:  None Available. FINDINGS: Cardiovascular: There is mild calcification of the aortic arch. Satisfactory opacification of the pulmonary arteries to the segmental level. No evidence of pulmonary embolism. Normal heart size. No pericardial effusion. Mediastinum/Nodes: No enlarged mediastinal, hilar, or axillary lymph nodes. Thyroid gland, trachea, and esophagus demonstrate no significant findings. Lungs/Pleura: Lungs are clear. No pleural effusion or pneumothorax. Upper Abdomen: Noninflamed diverticula are seen along the splenic flexure. Musculoskeletal: No chest wall abnormality. No acute or significant osseous findings. Review of the MIP images confirms the above findings. IMPRESSION: 1. No evidence of pulmonary embolism or other acute intrathoracic process. 2. Colonic diverticulosis. 3. Aortic atherosclerosis. Aortic Atherosclerosis (ICD10-I70.0). Electronically Signed   By: Aram Candela M.D.   On: 06/13/2023 02:39   DG Chest 2 View Result Date: 06/12/2023 CLINICAL DATA:  Chest pain. EXAM: CHEST - 2 VIEW COMPARISON:  November 21, 2016 FINDINGS: The heart size and mediastinal contours are within normal limits. Both lungs are clear. Radiopaque  surgical clips are seen within the right upper quadrant. The visualized skeletal structures are unremarkable. IMPRESSION: No active cardiopulmonary disease. Electronically Signed   By: Aram Candela M.D.   On: 06/12/2023 21:34    EKG EKG Interpretation Date/Time:  Saturday June 12 2023 20:37:53 EDT Ventricular Rate:  82 PR Interval:  180 QRS Duration:  84 QT Interval:  348 QTC Calculation: 406 R Axis:   51  Text Interpretation: Normal sinus rhythm Confirmed by Nicanor Alcon, Mieko Kneebone ([id number]) on 06/13/2023 12:31:20 AM  Radiology CT Angio Chest PE W and/or Wo Contrast Result Date: 06/13/2023 CLINICAL DATA:  Syncope. EXAM: CT ANGIOGRAPHY CHEST WITH CONTRAST TECHNIQUE: Multidetector CT imaging of the chest was performed using the standard protocol during bolus administration of intravenous contrast. Multiplanar CT image reconstructions and MIPs were obtained to evaluate the vascular anatomy. RADIATION DOSE REDUCTION: This exam was performed according to the departmental dose-optimization program which includes automated exposure control, adjustment of the mA and/or kV according to patient size and/or use of iterative reconstruction technique. CONTRAST:  75mL OMNIPAQUE IOHEXOL 350 MG/ML SOLN COMPARISON:  None Available. FINDINGS: Cardiovascular: There is mild calcification of the aortic arch. Satisfactory opacification of the pulmonary arteries to the segmental level. No evidence of pulmonary embolism. Normal heart size. No pericardial effusion. Mediastinum/Nodes: No enlarged mediastinal, hilar, or axillary lymph nodes. Thyroid gland, trachea, and esophagus demonstrate no significant findings. Lungs/Pleura: Lungs are clear. No pleural effusion or pneumothorax. Upper Abdomen: Noninflamed diverticula are seen along the splenic flexure. Musculoskeletal: No chest wall abnormality. No acute or significant osseous findings. Review of the MIP images confirms the above findings. IMPRESSION: 1. No evidence of  pulmonary embolism or other acute intrathoracic process. 2. Colonic diverticulosis. 3. Aortic atherosclerosis. Aortic Atherosclerosis (ICD10-I70.0). Electronically Signed   By: Aram Candela M.D.   On: 06/13/2023 02:39   DG Chest 2 View Result Date: 06/12/2023 CLINICAL DATA:  Chest pain. EXAM: CHEST - 2 VIEW COMPARISON:  November 21, 2016 FINDINGS: The heart size and mediastinal contours are within normal limits. Both lungs are clear. Radiopaque surgical clips are seen within the right upper quadrant. The visualized skeletal structures are unremarkable. IMPRESSION: No active cardiopulmonary  disease. Electronically Signed   By: Aram Candela M.D.   On: 06/12/2023 21:34    Procedures Procedures    Medications Ordered in ED Medications  alum & mag hydroxide-simeth (MAALOX/MYLANTA) 200-200-20 MG/5ML suspension 30 mL (30 mLs Oral Given 06/13/23 0114)  ketorolac (TORADOL) 30 MG/ML injection 15 mg (15 mg Intravenous Given 06/13/23 0139)  iohexol (OMNIPAQUE) 350 MG/ML injection 75 mL (75 mLs Intravenous Contrast Given 06/13/23 0233)    ED Course/ Medical Decision Making/ A&P                                 Medical Decision Making Patient with chest pain x 1 day worse with palpation radiates to the LUE  Amount and/or Complexity of Data Reviewed External Data Reviewed: notes.    Details: Previous notes reviewed  Labs: ordered.    Details: Negative pregnancy test 2 negative troponins.  Normal white count, hemoglobin normal 13.1, normal platelets.  Normal sodium 139, normal potassium 3.8, normal creatinine  Radiology: ordered and independent interpretation performed.    Details: Negative CTA by me  ECG/medicine tests: ordered and independent interpretation performed. Decision-making details documented in ED Course.  Risk OTC drugs. Prescription drug management. Risk Details: Ruled out for MI.  Heart score is 1 very low risk for MACE.  Ruled out for PE.  I suspect this is MSK.  Patient is  also concerned for gas.  I will treat for both MSK pain and GERD.  Stable for discharge.  Strict returns.      Final Clinical Impression(s) / ED Diagnoses Final diagnoses:  Precordial pain    No signs of systemic illness or infection. The patient is nontoxic-appearing on exam and vital signs are within normal limits.  I have reviewed the triage vital signs and the nursing notes. Pertinent labs & imaging results that were available during my care of the patient were reviewed by me and considered in my medical decision making (see chart for details). After history, exam, and medical workup I feel the patient has been appropriately medically screened and is safe for discharge home. Pertinent diagnoses were discussed with the patient. Patient was given return precautions.  Rx / DC Orders ED Discharge Orders          Ordered    lidocaine (LIDODERM) 5 %  Every 24 hours        06/13/23 0334    omeprazole (PRILOSEC) 20 MG capsule  Daily        06/13/23 0334              Jan Olano, MD 06/13/23 [id number]

## 2023-06-15 ENCOUNTER — Other Ambulatory Visit (INDEPENDENT_AMBULATORY_CARE_PROVIDER_SITE_OTHER)

## 2023-06-15 ENCOUNTER — Ambulatory Visit: Payer: Medicaid Other | Admitting: Gastroenterology

## 2023-06-15 ENCOUNTER — Encounter: Payer: Self-pay | Admitting: Gastroenterology

## 2023-06-15 VITALS — BP 130/90 | HR 80 | Ht 61.0 in | Wt 221.5 lb

## 2023-06-15 DIAGNOSIS — R7989 Other specified abnormal findings of blood chemistry: Secondary | ICD-10-CM

## 2023-06-15 DIAGNOSIS — K862 Cyst of pancreas: Secondary | ICD-10-CM

## 2023-06-15 DIAGNOSIS — Z9049 Acquired absence of other specified parts of digestive tract: Secondary | ICD-10-CM

## 2023-06-15 DIAGNOSIS — Z1211 Encounter for screening for malignant neoplasm of colon: Secondary | ICD-10-CM

## 2023-06-15 DIAGNOSIS — R12 Heartburn: Secondary | ICD-10-CM

## 2023-06-15 LAB — COMPREHENSIVE METABOLIC PANEL
ALT: 18 U/L (ref 0–35)
AST: 13 U/L (ref 0–37)
Albumin: 4.4 g/dL (ref 3.5–5.2)
Alkaline Phosphatase: 66 U/L (ref 39–117)
BUN: 14 mg/dL (ref 6–23)
CO2: 25 meq/L (ref 19–32)
Calcium: 9.8 mg/dL (ref 8.4–10.5)
Chloride: 105 meq/L (ref 96–112)
Creatinine, Ser: 1.01 mg/dL (ref 0.40–1.20)
GFR: 68.64 mL/min (ref >60.00)
Glucose, Bld: 88 mg/dL (ref 70–99)
Potassium: 3.6 meq/L (ref 3.5–5.1)
Sodium: 140 meq/L (ref 135–145)
Total Bilirubin: 0.4 mg/dL (ref 0.2–1.2)
Total Protein: 8.6 g/dL — ABNORMAL HIGH (ref 6.0–8.3)

## 2023-06-15 NOTE — Patient Instructions (Signed)
 Your provider has requested that you go to the basement level for lab work before leaving today. Press "B" on the elevator. The lab is located at the first door on the left as you exit the elevator.  Due to recent changes in healthcare laws, you may see the results of your imaging and laboratory studies on MyChart before your provider has had a chance to review them.  We understand that in some cases there may be results that are confusing or concerning to you. Not all laboratory results come back in the same time frame and the provider may be waiting for multiple results in order to interpret others.  Please give [place] 48 hours in order for your provider to thoroughly review all the results before contacting the office for clarification of your results.    You have been scheduled for an MRI at [facility] on 07/26/23. Your appointment time is 10:00 am . Please arrive to admitting (at main entrance of the hospital) 30 minutes prior to your appointment time for registration purposes. Please make certain not to have anything to eat or drink 6 hours prior to your test. In addition, if you have any metal in your body, have a pacemaker or defibrillator, please be sure to let your ordering physician know. This test typically takes 45 minutes to 1 hour to complete. Should you need to reschedule, please call [phone] to do so.  _______________________________________________________  If your blood pressure at your visit was 140/90 or greater, please contact your primary care physician to follow up on this.  _______________________________________________________  If you are age 85 or older, your body mass index should be between 23-30. Your Body mass index is 41.85 kg/m. If this is out of the aforementioned range listed, please consider follow up with your Primary Care Provider.  If you are age 87 or younger, your body mass index should be between 19-25. Your Body mass index is 41.85 kg/m. If this is out of  the aformentioned range listed, please consider follow up with your Primary Care Provider.   ________________________________________________________  The [facility] providers would like to encourage you to use [facility] to communicate with providers for non-urgent requests or questions.  Due to long hold times on the telephone, sending your provider a message by [facility] may be a faster and more efficient way to get a response.  Please allow 48 business hours for a response.  Please remember that this is for non-urgent requests.  _______________________________________________________  Thank you for choosing me and [facility].  Dr. Meridee Score

## 2023-06-15 NOTE — Progress Notes (Unsigned)
 GASTROENTEROLOGY OUTPATIENT CLINIC VISIT   Primary Care Provider Latrelle Dodrill, MD [address] [place] [address] [phone]  Patient Profile: Anna Anderson is a 43 y.o. female with a pmh significant for hypertension, hypothyroidism, PCOS, pseudotumor cerebri, status postcholecystectomy, GERD, pancreatic cyst.  The patient presents to the [facility] for an evaluation and management of problem(s) noted below:  Problem List 1. Pancreatic cyst   2. Abnormal LFTs   3. Pyrosis   4. History of cholecystectomy   5. Colon cancer screening    Discussed the use of AI scribe software for clinical note transcription with the patient, who gave verbal consent to proceed.  History of Present Illness This patient was seen in December by my partners Dr. Leone Payor and Dr. Rhea Belton during a hospital admission for abnormal LFTs and abdominal pain ultimately leading to cholecystectomy.  Her imaging during that hospitalization also showed a pancreatic cyst suggestive of a BD-IPMN but considered role of possible EUS in the future.  She presents for first follow-up in clinic, and this is the first time of I have met her.  She feels well from her cholecystectomy and is not experiencing any pain or discomfort similar to what brought her into the hospital.  What has been more concerning, and stress-producing, is her evaluation and reading and research into pancreatic cysts and she has become very worried as to the possibility of having pancreatic cancer develop.  There is no family history of pancreatic cancer.  She has a maternal uncle who had colon cancer but no other relatives.    She will have around her periods, LLQ discomfort that have been attributed to possible fibroid disease for which she has had TVUS performed in the past and has another coming up by her Gynecologist.  After her period is over, she no longer has that pain until her next cycle.  She denies any  significant alcohol use and does not smoke or use tobacco products.  She is concerned about maintaining her health, particularly in light of her family responsibilities, including caring for her four children.  No significant weight loss unintentionally.  She is not noting any changes in her bowel habits at this time.  She is willing to do what it takes for overall treatment or monitoring of the pancreatic cyst moving forward.  She has rare pyrosis symptoms depending on her dietary intake but they do not cause her issues overall.  The patient has never had an EGD/Colonoscopy.  GI Review of Systems Positive as above Negative for dysphagia, odynophagia, nausea, vomiting, early satiety, melena, hematochezia, change in bowel habits  Review of Systems General: Denies fevers/chills/weight loss unintentionally Cardiovascular: Denies chest pain Pulmonary: Denies shortness of breath Gastroenterological: See HPI Genitourinary: Denies darkened urine Hematological: Denies easy bruising/bleeding Dermatological: Denies jaundice Psychological: Mood is stable  Medications Current Outpatient Medications  Medication Sig Dispense Refill   amLODipine (NORVASC) 10 MG tablet TAKE 1 TABLET(10 MG) BY MOUTH DAILY. Need MD appt before further refills. 30 tablet 0   hydrochlorothiazide (HYDRODIURIL) 25 MG tablet TAKE 1 TABLET(25 MG) BY MOUTH DAILY 90 tablet 1   levothyroxine (SYNTHROID) 50 MCG tablet TAKE 1 TABLET BY MOUTH EVERY DAY 90 tablet 1   topiramate (TOPAMAX) 100 MG tablet Take 1 tablet (100 mg total) by mouth 2 (two) times daily. (Patient taking differently: Take 200 mg by mouth at bedtime.) 60 tablet 11   triamcinolone ointment (KENALOG) 0.5 % APPLY TOPICALLY TO THE AFFECTED AREA TWICE DAILY  FOR MODERATE TO SEVERE ECZEMA. DO NOT USE FOR MORE THAN 1 WEEK AT A TIME (Patient taking differently: Apply 1 Application topically daily as needed (eczema).) 60 g 3   lidocaine (LIDODERM) 5 % Place 1 patch onto the skin  daily. Remove & Discard patch within 12 hours or as directed by MD (Patient not taking: Reported on 06/15/2023) 30 patch 0   omeprazole (PRILOSEC) 20 MG capsule Take 1 capsule (20 mg total) by mouth daily. (Patient not taking: Reported on 06/15/2023) 30 capsule 0   No current facility-administered medications for this visit.    Allergies Allergies  Allergen Reactions   Amoxicillin Swelling   Lisinopril Cough    Histories Past Medical History:  Diagnosis Date   Abdominal pain 01/01/2020   AKI (acute kidney injury) (HCC) 03/18/2023   Concussion    Constipation 10/25/2017   Hypertension    Left lower quadrant abdominal pain 10/20/2018   PCOS (polycystic ovarian syndrome)    Post concussive syndrome 05/18/2016   Pseudotumor cerebri    Right facial swelling 08/12/2017   Subclinical hypothyroidism    Trichomonas infection 03/26/2021   Vitamin D deficiency 07/24/2014   Past Surgical History:  Procedure Laterality Date   CESAREAN SECTION     x3   CHOLECYSTECTOMY N/A 03/20/2023   Procedure: LAPAROSCOPIC CHOLECYSTECTOMY WITH INTRAOPERATIVE CHOLANGIOGRAM;  Surgeon: Kinsinger, De Blanch, MD;  Location: [facility] OR;  Service: General;  Laterality: N/A;   TUBAL LIGATION     Social History   Socioeconomic History   Marital status: Single    Spouse name: Not on file   Number of children: 4   Years of education: GED   Highest education level: Not on file  Occupational History   Occupation: Dietary Aid  Tobacco Use   Smoking status: Never   Smokeless tobacco: Never  Vaping Use   Vaping status: Never Used  Substance and Sexual Activity   Alcohol use: No   Drug use: No   Sexual activity: Yes    Birth control/protection: Surgical  Other Topics Concern   Not on file  Social History Narrative   Lives at home with her four children.   Right-handed.   3-4 cups caffeine some days.   Social Drivers of Corporate investment banker Strain: Not on file  Food Insecurity: No Food  Insecurity (03/18/2023)   Hunger Vital Sign    Worried About Running Out of Food in the Last Year: Never true    Ran Out of Food in the Last Year: Never true  Transportation Needs: No Transportation Needs (03/18/2023)   PRAPARE - Administrator, Civil Service (Medical): No    Lack of Transportation (Non-Medical): No  Physical Activity: Not on file  Stress: Not on file  Social Connections: Not on file  Intimate Partner Violence: Not At Risk (03/18/2023)   Humiliation, Afraid, Rape, and Kick questionnaire    Fear of Current or Ex-Partner: No    Emotionally Abused: No    Physically Abused: No    Sexually Abused: No   Family History  Problem Relation Age of Onset   Diabetes Mother    Heart disease Mother    Kidney failure Mother    Pseudotumor cerebri Father    Diabetes Sister    Diabetes Maternal Aunt    Colon cancer Maternal Uncle    Hypertension Maternal Grandmother    Heart attack Maternal Grandmother    Hypertension Maternal Grandfather    Heart attack Maternal Grandfather  Heart attack Paternal Grandmother    Esophageal cancer Neg Hx    Inflammatory bowel disease Neg Hx    Liver disease Neg Hx    Pancreatic cancer Neg Hx    Rectal cancer Neg Hx    Stomach cancer Neg Hx    I have reviewed her medical, social, and family history in detail and updated the electronic medical record as necessary.    PHYSICAL EXAMINATION  BP (!) 130/90 (BP Location: Left Arm, Patient Position: Sitting, Cuff Size: Large)   Pulse 80   Ht 5\' 1"  (1.549 m) Comment: height measured without shoes  Wt 221 lb 8 oz (100.5 kg)   LMP 06/12/2023   BMI 41.85 kg/m  Wt Readings from Last 3 Encounters:  06/15/23 221 lb 8 oz (100.5 kg)  03/26/23 221 lb 12.8 oz (100.6 kg)  03/20/23 221 lb 11.2 oz (100.6 kg)  GEN: NAD, appears stated age, doesn't appear chronically ill PSYCH: Cooperative, without pressured speech EYE: Conjunctivae pink, sclerae anicteric ENT: MMM CV:  Nontachycardic RESP: No audible wheezing GI: NABS, soft, NT/ND, without rebound or guarding MSK/EXT: No LE edema SKIN: No jaundice NEURO:  Alert & Oriented x 3, no focal deficits   REVIEW OF DATA  I reviewed the following data at the time of this encounter:  GI Procedures and Studies  No relevant studies to review  Laboratory Studies  Reviewed those in epic  Imaging Studies  December 2024 MRICP IMPRESSION: 1. Cholelithiasis without evidence of acute cholecystitis. No biliary ductal dilatation. 2. Thinly septated fluid signal cystic lesion within the central pancreatic head anterior to the common bile duct, probably communicating with the adjacent main pancreatic duct, measuring 1.3 x 1.2 cm. No solid component or suspicious contrast enhancement. No pancreatic ductal dilatation or surrounding inflammatory changes. 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 3. Hepatomegaly and hepatic steatosis. 4. Descending colonic diverticulosis. 5. Cardiomegaly.   ASSESSMENT  Ms. Ausburn is a 43 y.o. female with a pmh significant for hypertension, hypothyroidism, PCOS, pseudotumor cerebri, status postcholecystectomy, GERD, pancreatic cyst.  The patient is seen today for evaluation and management of:  1. Pancreatic cyst   2. Abnormal LFTs   3. Pyrosis   4. History of cholecystectomy   5. Colon cancer screening    The patient is clinically and hemodynamically stable.  She seems to have healed well from cholecystectomy for symptomatic cholelithiasis.  We will recheck LFTs to ensure they have normalized.  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  No overt red-flag symptom at this time is met based on ACG and ASGE guidelines for need of EUS aspiration.  As this is first encounter with cyst and overall significant concerns surrounding this, we will plan  a 4-6 month followup/surveillance with MR/MRCP imaging.  Based on those findings, will help [place] dictate monitoring/surveillance pattern for cyst.  Or if there are changes in imaging, the need for an EUS.  The risks of an EUS including intestinal perforation, bleeding, infection, aspiration, and medication effects were discussed as was the possibility it may not give a definitive diagnosis if a biopsy is performed.  When a biopsy of the pancreas is done as part of the EUS, there is an additional risk of pancreatitis at the rate of about 1-2%.  It was explained that procedure related pancreatitis is typically mild, although it can be severe and  even life threatening, which is why we do not perform random pancreatic biopsies and only biopsy a lesion/area we feel is concerning enough to warrant the risk.  I again, think she does not need EUS at this time.  She will need Colon Cancer screening at age 32 for which a recall has been placed into the system.  All patient questions were answered to the best of my ability, and the patient agrees to the aforementioned plan of action with follow-up as indicated.   PLAN  Laboratories as outlined below MRI/MRCP for Pancreatic Cyst surveillance -Currently no plan for EUS, unless significant changes are noted on MRI -Will plan surveillance protocol based on the results Colonoscopy at age 21   Orders Placed This Encounter  Procedures   MR ABDOMEN MRCP W WO CONTAST   Comp Met (CMET)    New Prescriptions   No medications on file   Modified Medications   No medications on file    Planned Follow Up No follow-ups on file.   Total Time in Face-to-Face and in Coordination of Care for patient including independent/personal interpretation/review of prior testing, medical history, examination, medication adjustment, communicating results with the patient directly, and documentation within the EHR is 45 minutes.   Corliss Parish, MD [place]  [facility] Office # [phone]

## 2023-06-16 DIAGNOSIS — R12 Heartburn: Secondary | ICD-10-CM | POA: Insufficient documentation

## 2023-06-23 ENCOUNTER — Encounter: Payer: Self-pay | Admitting: Neurology

## 2023-06-23 DIAGNOSIS — G932 Benign intracranial hypertension: Secondary | ICD-10-CM | POA: Diagnosis not present

## 2023-06-23 NOTE — Telephone Encounter (Signed)
 Called pt to let her know that Margie Ege would like to see her to discuss if ok to stop taking the Topamax. Pt has been scheduled. I have requested records from Dr Margaretmary Eddy office as well.

## 2023-06-24 DIAGNOSIS — Z113 Encounter for screening for infections with a predominantly sexual mode of transmission: Secondary | ICD-10-CM | POA: Diagnosis not present

## 2023-06-24 DIAGNOSIS — N852 Hypertrophy of uterus: Secondary | ICD-10-CM | POA: Diagnosis not present

## 2023-07-01 ENCOUNTER — Encounter: Payer: Self-pay | Admitting: Neurology

## 2023-07-01 ENCOUNTER — Ambulatory Visit: Admitting: Neurology

## 2023-07-01 VITALS — BP 138/89 | HR 62 | Ht 61.0 in | Wt 222.4 lb

## 2023-07-01 DIAGNOSIS — G932 Benign intracranial hypertension: Secondary | ICD-10-CM | POA: Diagnosis not present

## 2023-07-01 DIAGNOSIS — G43709 Chronic migraine without aura, not intractable, without status migrainosus: Secondary | ICD-10-CM | POA: Diagnosis not present

## 2023-07-01 MED ORDER — TOPIRAMATE 100 MG PO TABS: 100.0000 mg | ORAL_TABLET | Freq: Two times a day (BID) | ORAL | 11 refills | Status: DC

## 2023-07-01 NOTE — Patient Instructions (Signed)
 You can reduce the Topamax to 150 mg daily, call your doctor to discuss weight loss medication options. Continuing and maintaining weight loss is key for long term management pseudotumor cerebri. Keep follow up with Dr. Sherryll Burger. Keep me posted about weight loss medication before continuing to reduce the Topamax.
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
## 2023-07-19 ENCOUNTER — Encounter: Payer: Self-pay | Admitting: Neurology

## 2023-07-26 ENCOUNTER — Ambulatory Visit (HOSPITAL_COMMUNITY)
Admission: RE | Admit: 2023-07-26 | Discharge: 2023-07-26 | Disposition: A | Discharge status: Home / Self Care | Source: Ambulatory Visit | Attending: Gastroenterology | Admitting: Gastroenterology

## 2023-07-26 ENCOUNTER — Other Ambulatory Visit: Payer: Self-pay | Admitting: Gastroenterology

## 2023-07-26 DIAGNOSIS — Z1211 Encounter for screening for malignant neoplasm of colon: Secondary | ICD-10-CM

## 2023-07-26 DIAGNOSIS — Z9049 Acquired absence of other specified parts of digestive tract: Secondary | ICD-10-CM

## 2023-07-26 DIAGNOSIS — R7989 Other specified abnormal findings of blood chemistry: Secondary | ICD-10-CM

## 2023-07-26 DIAGNOSIS — K862 Cyst of pancreas: Secondary | ICD-10-CM | POA: Diagnosis not present

## 2023-07-26 DIAGNOSIS — K5730 Diverticulosis of large intestine without perforation or abscess without bleeding: Secondary | ICD-10-CM | POA: Diagnosis not present

## 2023-07-26 DIAGNOSIS — R160 Hepatomegaly, not elsewhere classified: Secondary | ICD-10-CM | POA: Diagnosis not present

## 2023-07-26 DIAGNOSIS — N281 Cyst of kidney, acquired: Secondary | ICD-10-CM | POA: Diagnosis not present

## 2023-07-26 MED ORDER — GADOBUTROL 1 MMOL/ML IV SOLN
9.0000 mL | Freq: Once | INTRAVENOUS | Status: AC | PRN
  Administered 2023-07-26: 9 mL via INTRAVENOUS

## 2023-07-28 ENCOUNTER — Other Ambulatory Visit: Payer: Self-pay | Admitting: Family Medicine

## 2023-07-28 DIAGNOSIS — I10 Essential (primary) hypertension: Secondary | ICD-10-CM

## 2023-08-03 ENCOUNTER — Ambulatory Visit: Admitting: Neurology

## 2023-10-27 ENCOUNTER — Other Ambulatory Visit: Payer: Self-pay | Admitting: Family Medicine

## 2023-10-27 DIAGNOSIS — I10 Essential (primary) hypertension: Secondary | ICD-10-CM

## 2023-12-06 ENCOUNTER — Telehealth: Payer: Self-pay | Admitting: Neurology

## 2023-12-06 NOTE — Telephone Encounter (Signed)
 Called and scheduled appt to discuss this further as slack requested. Pt was agreeable and voiced understanding and gratitude of all discussed.

## 2023-12-06 NOTE — Telephone Encounter (Signed)
 Pt called to discuss with Md about getting off of topiramate  (TOPAMAX ) 100 MG tablet  for a little while due to Starting new Job the patient is requesting to speak to MD about change .

## 2023-12-06 NOTE — Telephone Encounter (Signed)
 Returned call to pt who stated that she would like a break from topamax  and says its been confusing her and now she is employee of  as a Merchandiser, retail and would like to be sharper with her staff. Pt would like to be weaned off if possible instead of stopping cold [place].
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
## 2023-12-08 ENCOUNTER — Encounter: Payer: Self-pay | Admitting: Neurology

## 2023-12-08 ENCOUNTER — Ambulatory Visit: Admitting: Neurology

## 2024-01-03 ENCOUNTER — Ambulatory Visit (INDEPENDENT_AMBULATORY_CARE_PROVIDER_SITE_OTHER): Admitting: Family Medicine

## 2024-01-03 ENCOUNTER — Encounter: Payer: Self-pay | Admitting: Family Medicine

## 2024-01-03 VITALS — BP 126/90 | HR 82 | Ht 61.0 in | Wt 219.8 lb

## 2024-01-03 DIAGNOSIS — I10 Essential (primary) hypertension: Secondary | ICD-10-CM

## 2024-01-03 DIAGNOSIS — Z23 Encounter for immunization: Secondary | ICD-10-CM | POA: Diagnosis present

## 2024-01-03 DIAGNOSIS — G932 Benign intracranial hypertension: Secondary | ICD-10-CM | POA: Diagnosis not present

## 2024-01-03 NOTE — Assessment & Plan Note (Addendum)
 Above goal today. Advised to f/u in 2 weeks for recheck. - cont amlodipine , hydrochlorothiazide 

## 2024-01-03 NOTE — Progress Notes (Signed)
    SUBJECTIVE:   CHIEF COMPLAINT / HPI:  Anna Anderson is a 43yo F that pf flu vaccine counseling. - Pt needs to get flu shot for her work - She wanted to make sure it was safe with her medications. She takes topamax  for pseudotumor cerebri.  - She has previously declined flu shot due to this concern.  PERTINENT  PMH / PSH: HTN, GERD, migraines, pseudotumor cerebri  OBJECTIVE:   BP (!) 126/90   Pulse 82   Ht 5' 1 (1.549 m)   Wt 219 lb 12.8 oz (99.7 kg)   SpO2 100%   BMI 41.53 kg/m   General: Alert, pleasant woman. NAD. HEENT: NCAT. MMM. CV: RRR, no murmurs. Cap refill <2. Resp: CTAB, no wheezing or crackles. Normal WOB on RA.   Ext: Moves all ext spontaneously Skin: Warm, well perfused   ASSESSMENT/PLAN:   Assessment & Plan Pseudotumor cerebri Counseled pt that she is safe to take flu shot while on topamax . Discussed benefits and side effects of flu vaccination.  - Discussed tylenol  prn for soreness from vaccine if needed Encounter for immunization Flu shot today Primary hypertension Above goal today. Advised to f/u in 2 weeks for recheck. - cont amlodipine , hydrochlorothiazide    Twyla Nearing, MD [facility]

## 2024-01-03 NOTE — Assessment & Plan Note (Signed)
 Counseled pt that she is safe to take flu shot while on topamax . Discussed benefits and side effects of flu vaccination.  - Discussed tylenol  prn for soreness from vaccine if needed

## 2024-01-03 NOTE — Patient Instructions (Signed)
 1) You are safe to get your flu vaccine. You may have some arm soreness for the next few days. I recommend taking tylenol  as needed for the next few days for the soreness.   2) Your blood pressure was a little high today. I recommend coming back in 2 weeks with your primary doctor to get it rechecked. Make sure you are taking your medications.
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
## 2024-01-13 ENCOUNTER — Encounter: Payer: Self-pay | Admitting: Neurology

## 2024-01-13 ENCOUNTER — Ambulatory Visit: Admitting: Neurology

## 2024-01-14 ENCOUNTER — Ambulatory Visit: Admitting: Neurology

## 2024-01-14 ENCOUNTER — Encounter: Payer: Self-pay | Admitting: Neurology

## 2024-01-14 ENCOUNTER — Other Ambulatory Visit: Payer: Self-pay | Admitting: Family Medicine

## 2024-01-14 VITALS — BP 126/88 | HR 61 | Ht 62.0 in | Wt 216.4 lb

## 2024-01-14 DIAGNOSIS — G43709 Chronic migraine without aura, not intractable, without status migrainosus: Secondary | ICD-10-CM | POA: Diagnosis not present

## 2024-01-14 DIAGNOSIS — G932 Benign intracranial hypertension: Secondary | ICD-10-CM

## 2024-01-14 DIAGNOSIS — E669 Obesity, unspecified: Secondary | ICD-10-CM

## 2024-01-14 MED ORDER — TOPIRAMATE ER 100 MG PO CAP24: 200.0000 mg | ORAL_CAPSULE | Freq: Every day | ORAL | 11 refills | Status: AC
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
## 2024-01-14 NOTE — Telephone Encounter (Signed)
 Please let patient know I am refilling this medication, but she needs to schedule an appointment with me. She is past due to recheck her thyroid  levels.  Thanks, Laymon JINNY Legions, MD

## 2024-01-14 NOTE — Patient Instructions (Signed)
 Switch to extended release Topamax  100 mg, 2 capsules at bedtime, try for a few weeks, if you continue with cognitive slowing, let me know, we can consider reducing down to 100 mg. Other options are switching to Diamox or Zonegran. Continue to work on weight loss. Monitor for headaches or vision change. Keep follow up with Dr. Maree.

## 2024-01-21 ENCOUNTER — Telehealth: Payer: Self-pay

## 2024-01-21 ENCOUNTER — Other Ambulatory Visit (HOSPITAL_COMMUNITY): Payer: Self-pay

## 2024-01-21 NOTE — Telephone Encounter (Signed)
 Pharmacy Patient Advocate Encounter  Received notification from [facility] that Prior Authorization for  Topiramate  ER 100MG  er capsules  has been DENIED.  Full denial letter will be uploaded to the media tab. See denial reason below.  Here are the policy requirements your request did not meet:  Per your health plan's criteria, this drug is covered if you meet the following:  (1) You have tried one preferred drug: divalproex sodium extended-release tablet, divalproex sodium sprinkle capsule, divalproex sodium immediate-release tablet and valproic acid (2) You have tried and failed Brand Trokendi  XR capsules  *Please note: The drug(s) listed above may require additional review  PA #/Case ID/Reference #: [id number]

## 2024-01-24 NOTE — Telephone Encounter (Signed)
 Anna Anderson- I do not see where she has been on depakote or valproic in the past? Also wants her to try/fail brand Trokendi . How do you want to proceed?

## 2024-01-25 ENCOUNTER — Other Ambulatory Visit (HOSPITAL_COMMUNITY): Payer: Self-pay

## 2024-01-25 ENCOUNTER — Other Ambulatory Visit: Payer: Self-pay | Admitting: Family Medicine

## 2024-01-25 DIAGNOSIS — I10 Essential (primary) hypertension: Secondary | ICD-10-CM

## 2024-01-25 MED ORDER — TROKENDI XR 200 MG PO CP24: 200.0000 mg | ORAL_CAPSULE | Freq: Every day | ORAL | 11 refills | Status: AC

## 2024-01-25 NOTE — Telephone Encounter (Signed)
 Depakote is not a good choice for this patient given the concern for weight gain as a side effect. Obesity is a risk factor for IIH. This patient is already overweight and is working on weight loss.   We CAN try Trokendi  brand name. I will send in the order.   Meds ordered this encounter  Medications   TROKENDI  XR 200 MG CP24    Sig: Take 1 capsule (200 mg total) by mouth at bedtime.    Dispense:  30 capsule    Refill:  11

## 2024-01-25 NOTE — Addendum Note (Signed)
 Addended by: GAYLAND LAURAINE PARAS on: 01/25/2024 10:41 AM   Modules accepted: Orders

## 2024-01-25 NOTE — Telephone Encounter (Signed)
 Pt called to follow up about Pt PA for medication  Topiramate  ER (TROKENDI  XR) 100 MG CP24  Pt states she hasn't heard back and wanted to know what was going on ,Informed Pt that medication was denied  . Pt states she would like to speak to Nurse   [id number] - [place], [place] - [address] AT [address] [facility] [facility] Phone: [phone]  Fax: [phone]

## 2024-02-01 ENCOUNTER — Ambulatory Visit: Admitting: Family Medicine

## 2024-02-01 ENCOUNTER — Encounter: Payer: Self-pay | Admitting: Family Medicine

## 2024-02-01 ENCOUNTER — Ambulatory Visit: Payer: Self-pay | Admitting: Family Medicine

## 2024-02-01 VITALS — BP 119/86 | HR 89 | Ht 62.0 in | Wt 221.0 lb

## 2024-02-01 DIAGNOSIS — I10 Essential (primary) hypertension: Secondary | ICD-10-CM | POA: Diagnosis not present

## 2024-02-01 DIAGNOSIS — G932 Benign intracranial hypertension: Secondary | ICD-10-CM

## 2024-02-01 DIAGNOSIS — Z0000 Encounter for general adult medical examination without abnormal findings: Secondary | ICD-10-CM | POA: Diagnosis not present

## 2024-02-01 DIAGNOSIS — Z23 Encounter for immunization: Secondary | ICD-10-CM | POA: Diagnosis not present

## 2024-02-01 DIAGNOSIS — E038 Other specified hypothyroidism: Secondary | ICD-10-CM

## 2024-02-01 LAB — POCT GLYCOSYLATED HEMOGLOBIN (HGB A1C): Hemoglobin A1C: 5.4 % (ref 4.0–5.6)

## 2024-02-01 NOTE — Progress Notes (Signed)
"  °  Date of Visit: 02/01/2024   SUBJECTIVE:   HPI:  Discussed the use of AI scribe software for clinical note transcription with the patient, who gave verbal consent to proceed.  History of Present Illness Anna Anderson is a 43 year old female with intracranial hypertension who presents for follow-up and thyroid  level check.  Intracranial hypertension and associated symptoms - Diagnosed with idiopathic intracranial hypertension - Currently taking Topamax  100 mg twice daily for management. - Experiencing cognitive side effects attributed to Topamax . - Attempting to switch to extended-release Trokendi  to reduce cognitive side effects, but Medicaid denied coverage. - Has not used acetazolamide for intracranial pressure reduction. - Concerned about weight gain and prefers medications that assist with weight management. - Experiencing cognitive issues, believed to be related to Topamax  use. - Attempted transition to Trokendi  XR to minimize cognitive side effects, but unable to proceed due to insurance denial.  Hypertension - Blood pressure generally well-controlled on current regimen. - Currently taking amlodipine  10 mg daily and hydrochlorothiazide  25 mg daily.  Thyroid  dysfunction - Takes levothyroxine  50 mcg daily for thyroid  hormone replacement. - Present for thyroid  level check.  Gynecologic and reproductive health - Uses Vienva birth control pills for management of uterine fibroids. - Has not received the HPV vaccine and is considering vaccination for cervical cancer prevention.   OBJECTIVE:   BP 119/86   Pulse 89   Ht 5' 2 (1.575 m)   Wt 221 lb (100.2 kg)   SpO2 100%   BMI 40.42 kg/m  Gen: no acute distress, pleasant cooperative HEENT: normocephalic, atraumatic  Heart: regular rate and rhythm, no murmur Lungs: clear to auscultation bilaterally, normal work of breathing  Neuro: alert speech normal grossly nonfocal Ext: No appreciable lower extremity edema  bilaterally   ASSESSMENT/PLAN:   Assessment & Plan Subclinical hypothyroidism Check TSH, titrate levothyroxine  based on results Obesity, morbid, BMI 40.0-49.9 (HCC) Obesity management is ongoing. Encouraged future consideration of weight management medications with GI due to previous concerns about pancreatic cyst; may limit GLP-1 options. Update lipids, A1c, CMET Primary hypertension Well controlled. Continue current medication regimen.  Pseudotumor cerebri Continue follow up with neuro Routine adult health maintenance - Administer second dose of hepatitis B vaccine. - Administer HPV vaccine.    Farhad Burleson J. Donah, MD [facility] "

## 2024-02-01 NOTE — Patient Instructions (Signed)
 It was great to see you again today.  Labs today - thyroid , A1c, cholesterol, kidneys, liver  Hepatitis B vaccine today HPV vaccine today  Be well, Dr. Donah

## 2024-02-02 ENCOUNTER — Other Ambulatory Visit: Payer: Self-pay | Admitting: Family Medicine

## 2024-02-02 DIAGNOSIS — L309 Dermatitis, unspecified: Secondary | ICD-10-CM

## 2024-02-02 LAB — CMP14+EGFR
ALT: 17 IU/L (ref 0–32)
AST: 16 IU/L (ref 0–40)
Albumin: 4.1 g/dL (ref 3.9–4.9)
Alkaline Phosphatase: 63 IU/L (ref 41–116)
BUN/Creatinine Ratio: 13 (ref 9–23)
BUN: 12 mg/dL (ref 6–24)
Bilirubin Total: 0.3 mg/dL (ref 0.0–1.2)
CO2: 19 mmol/L — ABNORMAL LOW (ref 20–29)
Calcium: 9.7 mg/dL (ref 8.7–10.2)
Chloride: 103 mmol/L (ref 96–106)
Creatinine, Ser: 0.95 mg/dL (ref 0.57–1.00)
Globulin, Total: 4 g/dL (ref 1.5–4.5)
Glucose: 69 mg/dL — ABNORMAL LOW (ref 70–99)
Potassium: 3.3 mmol/L — ABNORMAL LOW (ref 3.5–5.2)
Sodium: 138 mmol/L (ref 134–144)
Total Protein: 8.1 g/dL (ref 6.0–8.5)
eGFR: 76 mL/min/1.73 (ref >59)

## 2024-02-02 LAB — LIPID PANEL
Chol/HDL Ratio: 5.4 ratio — ABNORMAL HIGH (ref 0.0–4.4)
Cholesterol, Total: 199 mg/dL (ref 100–199)
HDL: 37 mg/dL — ABNORMAL LOW (ref >39)
LDL Chol Calc (NIH): 128 mg/dL — ABNORMAL HIGH (ref 0–99)
Triglycerides: 192 mg/dL — ABNORMAL HIGH (ref 0–149)
VLDL Cholesterol Cal: 34 mg/dL (ref 5–40)

## 2024-02-02 LAB — TSH: TSH: 1.84 u[IU]/mL (ref 0.450–4.500)

## 2024-02-02 MED ORDER — POTASSIUM CHLORIDE CRYS ER 20 MEQ PO TBCR: 20.0000 meq | EXTENDED_RELEASE_TABLET | Freq: Every day | ORAL | 1 refills | Status: AC

## 2024-02-03 DIAGNOSIS — Z0000 Encounter for general adult medical examination without abnormal findings: Secondary | ICD-10-CM | POA: Insufficient documentation

## 2024-02-03 NOTE — Assessment & Plan Note (Signed)
-   Administer second dose of hepatitis B vaccine. - Administer HPV vaccine.

## 2024-02-03 NOTE — Assessment & Plan Note (Signed)
 Well controlled. Continue current medication regimen.

## 2024-02-03 NOTE — Assessment & Plan Note (Signed)
Continue follow up with neuro

## 2024-02-03 NOTE — Assessment & Plan Note (Signed)
 Check TSH, titrate levothyroxine  based on results

## 2024-02-04 ENCOUNTER — Encounter: Payer: Self-pay | Admitting: Neurology

## 2024-02-07 DIAGNOSIS — D251 Intramural leiomyoma of uterus: Secondary | ICD-10-CM | POA: Diagnosis not present

## 2024-02-07 DIAGNOSIS — Z3041 Encounter for surveillance of contraceptive pills: Secondary | ICD-10-CM | POA: Diagnosis not present

## 2024-02-08 NOTE — Telephone Encounter (Signed)
 Patient sent my chart message, wants to taper off Topamax . She is adamant about this. Will reduce by 50 mg every 1-2 weeks, reach out to Dr. Maree about her plans. Monitor symptoms.

## 2024-03-08 ENCOUNTER — Encounter: Payer: Self-pay | Admitting: Family Medicine

## 2024-08-16 ENCOUNTER — Ambulatory Visit: Admitting: Neurology
